# Patient Record
Sex: Female | Born: 1955 | Race: White | Hispanic: No | Marital: Single | Smoking: Former smoker
Health system: Southern US, Community
[De-identification: ages and names within clinical notes are randomized; demographics above are authoritative.]

## PROBLEM LIST (undated history)

## (undated) DIAGNOSIS — L659 Nonscarring hair loss, unspecified: Secondary | ICD-10-CM

## (undated) DIAGNOSIS — M25569 Pain in unspecified knee: Secondary | ICD-10-CM

## (undated) DIAGNOSIS — L853 Xerosis cutis: Secondary | ICD-10-CM

## (undated) DIAGNOSIS — R0683 Snoring: Secondary | ICD-10-CM

## (undated) DIAGNOSIS — R3915 Urgency of urination: Secondary | ICD-10-CM

## (undated) DIAGNOSIS — M2560 Stiffness of unspecified joint, not elsewhere classified: Secondary | ICD-10-CM

## (undated) DIAGNOSIS — L918 Other hypertrophic disorders of the skin: Secondary | ICD-10-CM

## (undated) DIAGNOSIS — R0981 Nasal congestion: Secondary | ICD-10-CM

## (undated) DIAGNOSIS — Z889 Allergy status to unspecified drugs, medicaments and biological substances status: Secondary | ICD-10-CM

## (undated) HISTORY — PX: FOOT SURGERY: SHX648

## (undated) HISTORY — DX: Pain in unspecified knee: M25.569

## (undated) HISTORY — DX: Stiffness of unspecified joint, not elsewhere classified: M25.60

## (undated) HISTORY — PX: TONSILLECTOMY: SUR1361

## (undated) HISTORY — DX: Allergy status to unspecified drugs, medicaments and biological substances: Z88.9

## (undated) HISTORY — DX: Other hypertrophic disorders of the skin: L91.8

## (undated) HISTORY — DX: Nonscarring hair loss, unspecified: L65.9

## (undated) HISTORY — DX: Snoring: R06.83

## (undated) HISTORY — DX: Urgency of urination: R39.15

## (undated) HISTORY — DX: Nasal congestion: R09.81

## (undated) HISTORY — DX: Xerosis cutis: L85.3

---

## 1998-09-12 ENCOUNTER — Other Ambulatory Visit: Admission: RE | Admit: 1998-09-12 | Discharge: 1998-09-12 | Payer: Self-pay | Admitting: *Deleted

## 1999-11-02 ENCOUNTER — Other Ambulatory Visit: Admission: RE | Admit: 1999-11-02 | Discharge: 1999-11-02 | Payer: Self-pay | Admitting: *Deleted

## 2000-09-26 ENCOUNTER — Encounter: Payer: Self-pay | Admitting: *Deleted

## 2000-09-26 ENCOUNTER — Encounter: Admission: RE | Admit: 2000-09-26 | Discharge: 2000-09-26 | Payer: Self-pay | Admitting: *Deleted

## 2002-03-01 ENCOUNTER — Other Ambulatory Visit: Admission: RE | Admit: 2002-03-01 | Discharge: 2002-03-01 | Payer: Self-pay | Admitting: Family Medicine

## 2002-04-02 ENCOUNTER — Encounter: Payer: Self-pay | Admitting: Family Medicine

## 2002-04-02 ENCOUNTER — Encounter: Admission: RE | Admit: 2002-04-02 | Discharge: 2002-04-02 | Payer: Self-pay | Admitting: Family Medicine

## 2003-03-13 ENCOUNTER — Other Ambulatory Visit: Admission: RE | Admit: 2003-03-13 | Discharge: 2003-03-13 | Payer: Self-pay | Admitting: Family Medicine

## 2011-08-20 ENCOUNTER — Other Ambulatory Visit (HOSPITAL_COMMUNITY)
Admission: RE | Admit: 2011-08-20 | Discharge: 2011-08-20 | Disposition: A | Discharge status: Home / Self Care | Payer: 59 | Source: Ambulatory Visit | Attending: Family Medicine | Admitting: Family Medicine

## 2011-08-20 ENCOUNTER — Other Ambulatory Visit: Payer: Self-pay | Admitting: Family Medicine

## 2011-08-20 DIAGNOSIS — Z0000 Encounter for general adult medical examination without abnormal findings: Secondary | ICD-10-CM | POA: Insufficient documentation

## 2015-08-20 DIAGNOSIS — H43812 Vitreous degeneration, left eye: Secondary | ICD-10-CM | POA: Diagnosis not present

## 2016-05-26 DIAGNOSIS — L821 Other seborrheic keratosis: Secondary | ICD-10-CM | POA: Diagnosis not present

## 2016-05-26 DIAGNOSIS — L570 Actinic keratosis: Secondary | ICD-10-CM | POA: Diagnosis not present

## 2016-05-26 DIAGNOSIS — L918 Other hypertrophic disorders of the skin: Secondary | ICD-10-CM | POA: Diagnosis not present

## 2016-05-26 DIAGNOSIS — L919 Hypertrophic disorder of the skin, unspecified: Secondary | ICD-10-CM | POA: Diagnosis not present

## 2016-05-26 DIAGNOSIS — B078 Other viral warts: Secondary | ICD-10-CM | POA: Diagnosis not present

## 2017-02-07 DIAGNOSIS — M1711 Unilateral primary osteoarthritis, right knee: Secondary | ICD-10-CM | POA: Diagnosis not present

## 2017-02-07 DIAGNOSIS — M1712 Unilateral primary osteoarthritis, left knee: Secondary | ICD-10-CM | POA: Diagnosis not present

## 2017-03-03 DIAGNOSIS — M25561 Pain in right knee: Secondary | ICD-10-CM | POA: Diagnosis not present

## 2017-03-03 DIAGNOSIS — M25521 Pain in right elbow: Secondary | ICD-10-CM | POA: Diagnosis not present

## 2017-03-31 DIAGNOSIS — H524 Presbyopia: Secondary | ICD-10-CM | POA: Diagnosis not present

## 2017-03-31 DIAGNOSIS — H5213 Myopia, bilateral: Secondary | ICD-10-CM | POA: Diagnosis not present

## 2017-03-31 DIAGNOSIS — H43812 Vitreous degeneration, left eye: Secondary | ICD-10-CM | POA: Diagnosis not present

## 2017-05-18 ENCOUNTER — Encounter (INDEPENDENT_AMBULATORY_CARE_PROVIDER_SITE_OTHER): Payer: Self-pay

## 2017-05-25 ENCOUNTER — Ambulatory Visit (INDEPENDENT_AMBULATORY_CARE_PROVIDER_SITE_OTHER): Payer: BLUE CROSS/BLUE SHIELD | Admitting: Family Medicine

## 2017-05-25 ENCOUNTER — Encounter (INDEPENDENT_AMBULATORY_CARE_PROVIDER_SITE_OTHER): Payer: Self-pay | Admitting: Family Medicine

## 2017-05-25 VITALS — BP 137/86 | HR 77 | Temp 98.0°F | Ht 66.0 in | Wt 325.0 lb

## 2017-05-25 DIAGNOSIS — Z6841 Body Mass Index (BMI) 40.0 and over, adult: Secondary | ICD-10-CM

## 2017-05-25 DIAGNOSIS — Z0289 Encounter for other administrative examinations: Secondary | ICD-10-CM

## 2017-05-25 DIAGNOSIS — Z9189 Other specified personal risk factors, not elsewhere classified: Secondary | ICD-10-CM

## 2017-05-25 DIAGNOSIS — Z1331 Encounter for screening for depression: Secondary | ICD-10-CM | POA: Diagnosis not present

## 2017-05-25 DIAGNOSIS — E559 Vitamin D deficiency, unspecified: Secondary | ICD-10-CM | POA: Diagnosis not present

## 2017-05-25 DIAGNOSIS — I10 Essential (primary) hypertension: Secondary | ICD-10-CM

## 2017-05-25 DIAGNOSIS — R5383 Other fatigue: Secondary | ICD-10-CM | POA: Diagnosis not present

## 2017-05-25 DIAGNOSIS — R0602 Shortness of breath: Secondary | ICD-10-CM

## 2017-05-25 NOTE — Progress Notes (Signed)
Office: [phone]  /  Fax: [phone]   Dear Dr. Luiz Blare,   Thank you for referring Lindsey [name] to our clinic. The following note includes my evaluation and treatment recommendations.  HPI:   Chief Complaint: OBESITY    ATHANASIA [name] has been referred by Lindsey Geralds, MD for consultation regarding her obesity and obesity related comorbidities.    LACOYA [name] (MR# [id number]) is a 62 y.o. female who presents on 05/25/2017 for obesity evaluation and treatment. Current BMI is Body mass index is 52.46 kg/m.Erskine Squibb has been struggling with her weight for many years and has been unsuccessful in either losing weight, maintaining weight loss, or reaching her healthy weight goal.     [name] must get BMI below 40 to have knee replacement.      Lindsey [name] attended our information session and states she is currently in the action stage of change and ready to dedicate time achieving and maintaining a healthier weight. Lindsey [name] is interested in becoming our patient and working on intensive lifestyle modifications including (but not limited to) diet, exercise and weight loss.    Lindsey [name] states she struggles with family and or coworkers weight loss sabotage her desired weight loss is 125 or 190 lbs she started gaining weight 62 years old she has significant food cravings issues  she snacks frequently in the evenings she wakes up frquently in the middle of the night to eat she skips meals frequently she is frequently drinking liquids with calories she frequently makes poor food choices she has problems with excessive hunger  she frequently eats larger portions than normal  she has binge eating behaviors she struggles with emotional eating    Fatigue [name] feels her energy is lower than it should be. This has worsened with weight gain and has not worsened recently. [name] admits to daytime somnolence and  admits to waking up still tired. Patient is at risk for obstructive sleep apnea. Patent has a history of  symptoms of daytime fatigue. Patient generally gets 8 hours of sleep per night, and states they generally have generally restful sleep. Snoring is present. Apneic episodes are present. Epworth Sleepiness Score is 8.  Dyspnea on exertion Saralynn notes increasing shortness of breath with exercising and seems to be worsening over time with weight gain. She notes getting out of breath sooner with activity than she used to. This has not gotten worse recently. Lindsey [name] denies orthopnea.  Hypertension Lindsey [name] is a 62 y.o. female with hypertension. Lindsey [name] is not on medications, blood pressure in pre-hypertension range. She is not seeing any physicians (including primary care physician) for now and would prefer to diet control. She denies chest pain or headache. She is working weight loss to help control her blood pressure with the goal of decreasing her risk of heart attack and stroke. Lindsey [name]'s blood pressure is not currently controlled.  At risk for cardiovascular disease Lindsey [name] is at a higher than average risk for cardiovascular disease due to obesity and hypertension. She currently denies any chest pain.  Vitamin D Deficiency Lindsey [name] has a diagnosis of vitamin D deficiency. She is not on Vit D, no recent labs. She notes fatigue and denies nausea, vomiting or muscle weakness.  Depression Screen Lindsey [name]'s Food and Mood (modified PHQ-9) score was  Depression screen PHQ 2/9 05/25/2017  Decreased Interest 1  Down, Depressed, Hopeless 2  PHQ - 2 Score 3  Altered sleeping 0  Tired, decreased energy 3  Change in appetite 1  Feeling bad  or failure about yourself  2  Trouble concentrating 2  Moving slowly or fidgety/restless 3  Suicidal thoughts 1  PHQ-9 Score 15  Difficult doing work/chores Somewhat difficult    ALLERGIES: Allergies  Allergen Reactions  . Lactose Intolerance (Gi) Other (See Comments)    Cramping  . Sulfa Antibiotics   . Amoxicillin Rash    MEDICATIONS: No current outpatient medications  on file prior to visit.   No current facility-administered medications on file prior to visit.     PAST MEDICAL HISTORY: Past Medical History:  Diagnosis Date  . Dry skin   . H/O seasonal allergies   . Hair loss   . Knee pain   . Multiple stiff joints   . Nasal congestion   . Skin tag   . Snoring   . Urgency of urination     PAST SURGICAL HISTORY: Past Surgical History:  Procedure Laterality Date  . FOOT SURGERY    . TONSILLECTOMY      SOCIAL HISTORY: Social History   Tobacco Use  . Smoking status: Former Smoker    Packs/day: 1.00    Years: 15.00    Pack years: 15.00    Types: Cigarettes  . Smokeless tobacco: Never Used  . Tobacco comment: quit 10 years ago  Substance Use Topics  . Alcohol use: Yes    Comment: 1-2 drinks a month or less  . Drug use: No    FAMILY HISTORY: Family History  Problem Relation Age of Onset  . High blood pressure Mother   . Depression Mother   . Obesity Mother   . Diabetes Father   . Heart disease Father   . Cancer Father   . Sleep apnea Father   . Obesity Father     ROS: Review of Systems  Constitutional: Positive for malaise/fatigue. Negative for weight loss.  HENT:       + Hoarseness + Nasal stuffiness  Eyes:       + Vision changes + Wear glasses or contacts + Floaters  Respiratory: Positive for shortness of breath (with exertion).   Cardiovascular: Negative for chest pain and orthopnea.       + Difficulty breathing while lying down + Calf/leg pain with walking + Leg cramping  Gastrointestinal: Negative for nausea and vomiting.  Genitourinary: Positive for urgency.  Musculoskeletal:       Negative muscle weakness + Muscle or joint pain + Muscle stiffness  Skin: Positive for itching.       + Dryness  Neurological: Positive for weakness. Negative for headaches.  Psychiatric/Behavioral: Positive for depression. Negative for suicidal ideas.       + Stress    PHYSICAL EXAM: Blood pressure 137/86, pulse 77,  temperature 98 F (36.7 C), temperature source Oral, height 5\' 6"  (1.676 m), weight (!) 325 lb (147.4 kg), SpO2 94 %. Body mass index is 52.46 kg/m. Physical Exam  Constitutional: She is oriented to person, place, and time. She appears well-developed and well-nourished.  HENT:  Head: Normocephalic and atraumatic.  Nose: Nose normal.  Eyes: EOM are normal. No scleral icterus.  Neck: Normal range of motion. Neck supple. No thyromegaly present.  Cardiovascular: Normal rate and regular rhythm.  Pulmonary/Chest: Effort normal. No respiratory distress.  Abdominal: Soft. There is no tenderness.  + Obesity  Musculoskeletal:  Range of Motion normal in all 4 extremities Trace edema noted in bilateral lower extremities  Neurological: She is alert and oriented to person, place, and time. Coordination normal.  Skin: Skin  is warm and dry.  Psychiatric: She has a normal mood and affect. Her behavior is normal.  Vitals reviewed.   RECENT LABS AND TESTS: BMET No results found for: NA, K, CL, CO2, GLUCOSE, BUN, CREATININE, CALCIUM, GFRNONAA, GFRAA No results found for: HGBA1C No results found for: INSULIN CBC No results found for: WBC, RBC, HGB, HCT, PLT, MCV, MCH, MCHC, RDW, LYMPHSABS, MONOABS, EOSABS, BASOSABS Iron/TIBC/Ferritin/ %Sat No results found for: IRON, TIBC, FERRITIN, IRONPCTSAT Lipid Panel  No results found for: CHOL, TRIG, HDL, CHOLHDL, VLDL, LDLCALC, LDLDIRECT Hepatic Function Panel  No results found for: PROT, ALBUMIN, AST, ALT, ALKPHOS, BILITOT, BILIDIR, IBILI No results found for: TSH  ECG  shows NSR with a rate of 80 BPM INDIRECT CALORIMETER done today shows a VO2 of 439 and a REE of 3053.  Her calculated basal metabolic rate is [id number] thus her basal metabolic rate is better than expected.    ASSESSMENT AND PLAN: Other fatigue - Plan: EKG 12-Lead, Vitamin B12, CBC With Differential, Comprehensive metabolic panel, Folate, Hemoglobin A1c, Insulin, random, Lipid Panel With  LDL/HDL Ratio, T3, T4, free, TSH  Shortness of breath on exertion - Plan: CBC With Differential  Essential hypertension - Plan: Comprehensive metabolic panel  Vitamin D deficiency - Plan: VITAMIN D 25 Hydroxy (Vit-D Deficiency, Fractures)  Depression screening  At risk for heart disease  Class 3 severe obesity with serious comorbidity and body mass index (BMI) of 50.0 to 59.9 in adult, unspecified obesity type (HCC)  PLAN:  Fatigue Erskine SquibbJane was informed that her fatigue may be related to obesity, depression or many other causes. Labs will be ordered, and in the meanwhile Erskine SquibbJane has agreed to work on diet, exercise and weight loss to help with fatigue. Proper sleep hygiene was discussed including the need for 7-8 hours of quality sleep each night. A sleep study was not ordered based on symptoms and Epworth score.  Dyspnea on exertion Alexxus's shortness of breath appears to be obesity related and exercise induced. She has agreed to work on weight loss and gradually increase exercise to treat her exercise induced shortness of breath. If Erskine SquibbJane follows our instructions and loses weight without improvement of her shortness of breath, we will plan to refer to pulmonology. We will monitor this condition regularly. Erskine SquibbJane agrees to this plan.  Hypertension We discussed sodium restriction, working on healthy weight loss, and a regular exercise program as the means to achieve improved blood pressure control. Erskine SquibbJane agreed with this plan and agreed to follow up as directed. We will continue to monitor her blood pressure as well as her progress with the above lifestyle modifications. She will continue diet and exercise and will watch for signs of hypotension as she continues her lifestyle modifications. We will check labs and Erskine SquibbJane agrees to follow up with our clinic in 2 weeks and we will recheck blood pressure at that time.  Cardiovascular risk counselling Erskine SquibbJane was given extended (15 minutes) coronary artery  disease prevention counseling today. She is 62 y.o. female and has risk factors for heart disease including obesity and hypertension. We discussed intensive lifestyle modifications today with an emphasis on specific weight loss instructions and strategies. Pt was also informed of the importance of increasing exercise and decreasing saturated fats to help prevent heart disease.  Vitamin D Deficiency Erskine SquibbJane was informed that low vitamin D levels contributes to fatigue and are associated with obesity, breast, and colon cancer. She will follow up for routine testing of vitamin D, at least 2-3 times  per year. She was informed of the risk of over-replacement of vitamin D and agrees to not increase her dose unless she discusses this with [place] first. We will check labs and Tija agrees to follow up with our clinic in 2 weeks.  Depression Screen Chyanna had a strongly positive depression screening. Depression is commonly associated with obesity and often results in emotional eating behaviors. We will monitor this closely and work on CBT to help improve the non-hunger eating patterns. Referral to Psychology may be required if no improvement is seen as she continues in our clinic.  Obesity Morrisa is currently in the action stage of change and her goal is to continue with weight loss efforts. I recommend Inanna begin the structured treatment plan as follows:  She has agreed to follow the Category 3 plan + 300 calories Lakecia has been instructed to eventually work up to a goal of 150 minutes of combined cardio and strengthening exercise per week for weight loss and overall health benefits. We discussed the following Behavioral Modification Strategies today: increasing lean protein intake, decreasing simple carbohydrates  and decrease eating out   She was informed of the importance of frequent follow up visits to maximize her success with intensive lifestyle modifications for her multiple health conditions. She was informed we  would discuss her lab results at her next visit unless there is a critical issue that needs to be addressed sooner. Tabria agreed to keep her next visit at the agreed upon time to discuss these results.    OBESITY BEHAVIORAL INTERVENTION VISIT  Today's visit was # 1 out of 22.  Starting weight: 325 lbs Starting date: 05/25/17 Today's weight : 325 lbs  Today's date: 05/25/2017 Total lbs lost to date: 0 (Patients must lose 7 lbs in the first 6 months to continue with counseling)   ASK: We discussed the diagnosis of obesity with [name] today and Jaidee agreed to give [place] permission to discuss obesity behavioral modification therapy today.  ASSESS: Quenesha has the diagnosis of obesity and her BMI today is 52.48 Tanazia is in the action stage of change   ADVISE: Jody was educated on the multiple health risks of obesity as well as the benefit of weight loss to improve her health. She was advised of the need for long term treatment and the importance of lifestyle modifications.  AGREE: Multiple dietary modification options and treatment options were discussed and  Lekia agreed to the above obesity treatment plan.   I, Burt Knack, am acting as transcriptionist for Quillian Quince, MD  I have reviewed the above documentation for accuracy and completeness, and I agree with the above. -Quillian Quince, MD

## 2017-05-26 LAB — CBC WITH DIFFERENTIAL
Basophils Absolute: 0 10*3/uL (ref 0.0–0.2)
Basos: 1 %
EOS (ABSOLUTE): 0.2 10*3/uL (ref 0.0–0.4)
Eos: 4 %
Hematocrit: 40.7 % (ref 34.0–46.6)
Hemoglobin: 13.6 g/dL (ref 11.1–15.9)
Immature Grans (Abs): 0 10*3/uL (ref 0.0–0.1)
Immature Granulocytes: 0 %
Lymphocytes Absolute: 1.8 10*3/uL (ref 0.7–3.1)
Lymphs: 30 %
MCH: 30.5 pg (ref 26.6–33.0)
MCHC: 33.4 g/dL (ref 31.5–35.7)
MCV: 91 fL (ref 79–97)
Monocytes Absolute: 0.6 10*3/uL (ref 0.1–0.9)
Monocytes: 10 %
Neutrophils Absolute: 3.3 10*3/uL (ref 1.4–7.0)
Neutrophils: 55 %
RBC: 4.46 x10E6/uL (ref 3.77–5.28)
RDW: 13.9 % (ref 12.3–15.4)
WBC: 6 10*3/uL (ref 3.4–10.8)

## 2017-05-26 LAB — COMPREHENSIVE METABOLIC PANEL
ALT: 60 IU/L — ABNORMAL HIGH (ref 0–32)
AST: 31 IU/L (ref 0–40)
Albumin/Globulin Ratio: 1.5 (ref 1.2–2.2)
Albumin: 4.1 g/dL (ref 3.6–4.8)
Alkaline Phosphatase: 81 IU/L (ref 39–117)
BUN/Creatinine Ratio: 13 (ref 12–28)
BUN: 10 mg/dL (ref 8–27)
Bilirubin Total: 0.8 mg/dL (ref 0.0–1.2)
CO2: 25 mmol/L (ref 20–29)
Calcium: 9.5 mg/dL (ref 8.7–10.3)
Chloride: 104 mmol/L (ref 96–106)
Creatinine, Ser: 0.75 mg/dL (ref 0.57–1.00)
GFR calc Af Amer: 99 mL/min/{1.73_m2} (ref >59)
GFR calc non Af Amer: 86 mL/min/{1.73_m2} (ref >59)
Globulin, Total: 2.7 g/dL (ref 1.5–4.5)
Glucose: 110 mg/dL — ABNORMAL HIGH (ref 65–99)
Potassium: 4.8 mmol/L (ref 3.5–5.2)
Sodium: 144 mmol/L (ref 134–144)
Total Protein: 6.8 g/dL (ref 6.0–8.5)

## 2017-05-26 LAB — LIPID PANEL WITH LDL/HDL RATIO
Cholesterol, Total: 164 mg/dL (ref 100–199)
HDL: 49 mg/dL (ref >39)
LDL Calculated: 100 mg/dL — ABNORMAL HIGH (ref 0–99)
LDL/HDL RATIO: 2 ratio (ref 0.0–3.2)
Triglycerides: 75 mg/dL (ref 0–149)
VLDL Cholesterol Cal: 15 mg/dL (ref 5–40)

## 2017-05-26 LAB — HEMOGLOBIN A1C
Est. average glucose Bld gHb Est-mCnc: 117 mg/dL
Hgb A1c MFr Bld: 5.7 % — ABNORMAL HIGH (ref 4.8–5.6)

## 2017-05-26 LAB — VITAMIN D 25 HYDROXY (VIT D DEFICIENCY, FRACTURES): Vit D, 25-Hydroxy: 24.1 ng/mL — ABNORMAL LOW (ref 30.0–100.0)

## 2017-05-26 LAB — T3: T3 TOTAL: 144 ng/dL (ref 71–180)

## 2017-05-26 LAB — FOLATE: Folate: >20 ng/mL (ref >3.0)

## 2017-05-26 LAB — T4, FREE: Free T4: 1.12 ng/dL (ref 0.82–1.77)

## 2017-05-26 LAB — INSULIN, RANDOM: INSULIN: 44.2 u[IU]/mL — ABNORMAL HIGH (ref 2.6–24.9)

## 2017-05-26 LAB — VITAMIN B12: Vitamin B-12: 341 pg/mL (ref 232–1245)

## 2017-05-26 LAB — TSH: TSH: 2.07 u[IU]/mL (ref 0.450–4.500)

## 2017-05-29 ENCOUNTER — Encounter (INDEPENDENT_AMBULATORY_CARE_PROVIDER_SITE_OTHER): Payer: Self-pay | Admitting: Family Medicine

## 2017-06-01 ENCOUNTER — Encounter (INDEPENDENT_AMBULATORY_CARE_PROVIDER_SITE_OTHER): Payer: Self-pay | Admitting: Family Medicine

## 2017-06-08 ENCOUNTER — Ambulatory Visit (INDEPENDENT_AMBULATORY_CARE_PROVIDER_SITE_OTHER): Payer: BLUE CROSS/BLUE SHIELD | Admitting: Family Medicine

## 2017-06-08 ENCOUNTER — Encounter (INDEPENDENT_AMBULATORY_CARE_PROVIDER_SITE_OTHER): Payer: Self-pay | Admitting: Family Medicine

## 2017-06-08 VITALS — BP 176/97 | HR 77 | Temp 98.1°F | Ht 66.0 in | Wt 312.0 lb

## 2017-06-08 DIAGNOSIS — E559 Vitamin D deficiency, unspecified: Secondary | ICD-10-CM | POA: Diagnosis not present

## 2017-06-08 DIAGNOSIS — R7303 Prediabetes: Secondary | ICD-10-CM | POA: Diagnosis not present

## 2017-06-08 DIAGNOSIS — Z9189 Other specified personal risk factors, not elsewhere classified: Secondary | ICD-10-CM

## 2017-06-08 DIAGNOSIS — Z6841 Body Mass Index (BMI) 40.0 and over, adult: Secondary | ICD-10-CM | POA: Diagnosis not present

## 2017-06-08 MED ORDER — VITAMIN D (ERGOCALCIFEROL) 1.25 MG (50000 UNIT) PO CAPS: 50000.0000 [IU] | ORAL_CAPSULE | ORAL | 0 refills | Status: DC

## 2017-06-08 NOTE — Progress Notes (Signed)
Office: [phone]  /  Fax: [phone]   HPI:   Chief Complaint: OBESITY Lindsey [name] is here to discuss her progress with her obesity treatment plan. She is on the Category 3 plan +300 calories and is following her eating plan approximately 90 % of the time. She states she is exercising 0 minutes 0 times per week. Chauntae has done well with weight loss, but she still ate out 2 to 3 times per week, but she made better choices. She really misses simple carbohydrates, especially in the evening. She has anxiety about her weight and has googled many of her lab results. Her weight is (!) 312 lb (141.5 kg) today and has had a weight loss of 13 pounds over a period of 2 weeks since her last visit. She has lost 13 lbs since starting treatment with [place].  Vitamin D deficiency Lindsey [name] has a diagnosis of vitamin D deficiency. She is currently taking multi vitamin and denies nausea, vomiting or muscle weakness.  Pre-Diabetes Lindsey [name] has a new diagnosis of pre-diabetes based on her elevated Hgb A1c, fasting glucose and insulin. Lindsey [name] this puts her at greater risk of developing diabetes. Lindsey [name] is not taking metformin currently and she continues to work on diet and exercise to decrease risk of diabetes. She notes polyphagia and feels deprived with lower simple carbohydrates, but she has done well with weight loss. Lindsey [name] denies nausea or hypoglycemia.  At risk for diabetes Lindsey [name] is at higher than average risk for developing diabetes due to her obesity. She currently denies polyuria or polydipsia.  ALLERGIES: Allergies  Allergen Reactions  . Lactose Intolerance (Gi) Other (See Comments)    Cramping  . Sulfa Antibiotics   . Amoxicillin Rash    MEDICATIONS: No current outpatient medications on file prior to visit.   No current facility-administered medications on file prior to visit.     PAST MEDICAL HISTORY: Past Medical History:  Diagnosis Date  . Dry skin   . H/O seasonal allergies   . Hair loss    . Knee pain   . Multiple stiff joints   . Nasal congestion   . Skin tag   . Snoring   . Urgency of urination     PAST SURGICAL HISTORY: Past Surgical History:  Procedure Laterality Date  . FOOT SURGERY    . TONSILLECTOMY      SOCIAL HISTORY: Social History   Tobacco Use  . Smoking status: Former Smoker    Packs/day: 1.00    Years: 15.00    Pack years: 15.00    Types: Cigarettes  . Smokeless tobacco: Never Used  . Tobacco comment: quit 10 years ago  Substance Use Topics  . Alcohol use: Yes    Comment: 1-2 drinks a month or less  . Drug use: No    FAMILY HISTORY: Family History  Problem Relation Age of Onset  . High blood pressure Mother   . Depression Mother   . Obesity Mother   . Diabetes Father   . Heart disease Father   . Cancer Father   . Sleep apnea Father   . Obesity Father     ROS: Review of Systems  Constitutional: Positive for weight loss.  Gastrointestinal: Negative for nausea and vomiting.  Musculoskeletal:       Negative for muscle weakness  Endo/Heme/Allergies:       Positive for polyphagia Negative for hypoglycemia    PHYSICAL EXAM: Blood pressure (!) 176/97, pulse 77, temperature 98.1 F (36.7 C), temperature  source Oral, height 5\' 6"  (1.676 m), weight (!) 312 lb (141.5 kg), SpO2 97 %. Body mass index is 50.36 kg/m. Physical Exam  Constitutional: She is oriented to person, place, and time. She appears well-developed and well-nourished.  Cardiovascular: Normal rate.  Pulmonary/Chest: Effort normal.  Musculoskeletal: Normal range of motion.  Neurological: She is oriented to person, place, and time.  Skin: Skin is warm and dry.  Psychiatric: She has a normal mood and affect. Her behavior is normal.  Vitals reviewed.   RECENT LABS AND TESTS: BMET    Component Value Date/Time   NA 144 05/25/2017 1147   K 4.8 05/25/2017 1147   CL 104 05/25/2017 1147   CO2 25 05/25/2017 1147   GLUCOSE 110 (H) 05/25/2017 1147   BUN 10  05/25/2017 1147   CREATININE 0.75 05/25/2017 1147   CALCIUM 9.5 05/25/2017 1147   GFRNONAA 86 05/25/2017 1147   GFRAA 99 05/25/2017 1147   Lab Results  Component Value Date   HGBA1C 5.7 (H) 05/25/2017   Lab Results  Component Value Date   INSULIN 44.2 (H) 05/25/2017   CBC    Component Value Date/Time   WBC 6.0 05/25/2017 1147   RBC 4.46 05/25/2017 1147   HGB 13.6 05/25/2017 1147   HCT 40.7 05/25/2017 1147   MCV 91 05/25/2017 1147   MCH 30.5 05/25/2017 1147   MCHC 33.4 05/25/2017 1147   RDW 13.9 05/25/2017 1147   LYMPHSABS 1.8 05/25/2017 1147   EOSABS 0.2 05/25/2017 1147   BASOSABS 0.0 05/25/2017 1147   Iron/TIBC/Ferritin/ %Sat No results found for: IRON, TIBC, FERRITIN, IRONPCTSAT Lipid Panel     Component Value Date/Time   CHOL 164 05/25/2017 1147   TRIG 75 05/25/2017 1147   HDL 49 05/25/2017 1147   LDLCALC 100 (H) 05/25/2017 1147   Hepatic Function Panel     Component Value Date/Time   PROT 6.8 05/25/2017 1147   ALBUMIN 4.1 05/25/2017 1147   AST 31 05/25/2017 1147   ALT 60 (H) 05/25/2017 1147   ALKPHOS 81 05/25/2017 1147   BILITOT 0.8 05/25/2017 1147      Component Value Date/Time   TSH 2.070 05/25/2017 1147   Results for ADAIAH, JASKOT (MRN [id number]) as of 06/08/2017 17:26  Ref. Range 05/25/2017 11:47  Vitamin D, 25-Hydroxy Latest Ref Range: 30.0 - 100.0 ng/mL 24.1 (L)    ASSESSMENT AND PLAN: Vitamin D deficiency - Plan: Vitamin D, Ergocalciferol, (DRISDOL) 50000 units CAPS capsule  Prediabetes  At risk for diabetes mellitus  Class 3 severe obesity with serious comorbidity and body mass index (BMI) of 50.0 to 59.9 in adult, unspecified obesity type (HCC)  PLAN:  Vitamin D Deficiency Lindsey [name] that low vitamin D levels contributes to fatigue and are associated with obesity, breast, and colon cancer. She agrees to continue multi vitamin and start to take prescription Vit D @50 ,000 IU every week #4 with no refills and will follow up for  routine testing of vitamin D, at least 2-3 times per year. She was informed of the risk of over-replacement of vitamin D and agrees to not increase her dose unless she discusses this with [place] first. Mckynlie agrees to follow up with our clinic in 2 weeks.  Pre-Diabetes Lindsey [name] will continue to work on weight loss, exercise, and decreasing simple carbohydrates in her diet to help decrease the risk of diabetes. We dicussed metformin including benefits and risks. She was informed that eating too many simple carbohydrates or too many calories at  one sitting increases the likelihood of GI side effects. Lindsey [name] defers metformin for now and a prescription was not written today. We will recheck labs in 3 months and Lindsey [name] agreed to follow up with us as directed to monitor her progress.  Diabetes risk counseling Lindsey [name] was given extended (30 minutes) diabetes prevention counseling today. She is 62 y.o. female and has risk factors for diabetes including obesity and pre-diabetes. We discussed intensive lifestyle modifications today with an emphasis on weight loss as well as increasing exercise and decreasing simple carbohydrates in her diet.  Obesity Lindsey [name] is currently in the action stage of change. As such, her goal is to continue with weight loss efforts She has agreed to follow the Category 3 plan +300 calories with some modifications Lindsey [name] has been instructed to work up to a goal of 150 minutes of combined cardio and strengthening exercise per week for weight loss and overall health benefits. We discussed the following Behavioral Modification Strategies today: increasing lean protein intake and decreasing simple carbohydrates   Lindsey [name] has agreed to follow up with our clinic in 2 weeks. She was informed of the importance of frequent follow up visits to maximize her success with intensive lifestyle modifications for her multiple health conditions.   OBESITY BEHAVIORAL INTERVENTION VISIT  Today's visit was # 2 out of  22.  Starting weight: 325 lbs Starting date: 05/25/17 Today's weight : 312 lbs Today's date: 06/08/2017 Total lbs lost to date: 13 (Patients must lose 7 lbs in the first 6 months to continue with counseling)   ASK: We discussed the diagnosis of obesity with Georg RuddleJane E Ciresi today and Lindsey [name] agreed to give us permission to discuss obesity behavioral modification therapy today.  ASSESS: Lindsey [name] has the diagnosis of obesity and her BMI today is 50.38 Lindsey [name] is in the action stage of change   ADVISE: Lindsey [name] was educated on the multiple health risks of obesity as well as the benefit of weight loss to improve her health. She was advised of the need for long term treatment and the importance of lifestyle modifications.  AGREE: Multiple dietary modification options and treatment options were discussed and  Lindsey [name] agreed to the above obesity treatment plan.  I, [place] CraneJoanne Murray, am acting as transcriptionist for Quillian Quincearen Beasley, MD  I have reviewed the above documentation for accuracy and completeness, and I agree with the above. -Quillian Quincearen Beasley, MD

## 2017-06-15 ENCOUNTER — Encounter (INDEPENDENT_AMBULATORY_CARE_PROVIDER_SITE_OTHER): Payer: Self-pay | Admitting: Family Medicine

## 2017-06-28 DIAGNOSIS — H1031 Unspecified acute conjunctivitis, right eye: Secondary | ICD-10-CM | POA: Diagnosis not present

## 2017-06-29 ENCOUNTER — Ambulatory Visit (INDEPENDENT_AMBULATORY_CARE_PROVIDER_SITE_OTHER): Payer: BLUE CROSS/BLUE SHIELD | Admitting: Family Medicine

## 2017-06-29 VITALS — BP 148/80 | HR 78 | Temp 97.9°F | Ht 66.0 in | Wt 304.0 lb

## 2017-06-29 DIAGNOSIS — Z9189 Other specified personal risk factors, not elsewhere classified: Secondary | ICD-10-CM

## 2017-06-29 DIAGNOSIS — I10 Essential (primary) hypertension: Secondary | ICD-10-CM

## 2017-06-29 DIAGNOSIS — E559 Vitamin D deficiency, unspecified: Secondary | ICD-10-CM | POA: Diagnosis not present

## 2017-06-29 DIAGNOSIS — Z6841 Body Mass Index (BMI) 40.0 and over, adult: Secondary | ICD-10-CM

## 2017-06-29 MED ORDER — VITAMIN D (ERGOCALCIFEROL) 1.25 MG (50000 UNIT) PO CAPS: 50000.0000 [IU] | ORAL_CAPSULE | ORAL | 0 refills | Status: DC

## 2017-06-29 NOTE — Progress Notes (Signed)
Office: [phone]  /  Fax: [phone]   HPI:   Chief Complaint: OBESITY Lindsey [name] is here to discuss her progress with her obesity treatment plan. She is on the Category 3 plan + 300 calories with some modifications and is following her eating plan approximately 75 % of the time. She states she is exercising 0 minutes 0 times per week. Lindsey [name] continues to do well with weight loss but is feeling deprived especially for dinner. She is struggling to eat all of her protein.  Her weight is (!) 304 lb (137.9 kg) today and has had a weight loss of 8 pounds over a period of 3 weeks since her last visit. She has lost 21 lbs since starting treatment with Lindsey [name].  Vitamin D Deficiency Lindsey [name] has a diagnosis of vitamin D deficiency. She is on prescription Vit D, not yet at goal. She denies nausea, vomiting or muscle weakness.  Hypertension Lindsey RuddleJane E [name] is a 62 y.o. female with hypertension. Lindsey [name]'s blood pressure is not yet at goal, she wants to attempt to improve with diet and exercise. She denies chest pain or headache. She is working weight loss to help control her blood pressure with the goal of decreasing her risk of heart attack and stroke. Hebe's blood pressure is not currently controlled.  At risk for cardiovascular disease Lindsey [name] is at a higher than average risk for cardiovascular disease due to obesity and hypertension. She currently denies any chest pain.  ALLERGIES: Allergies  Allergen Reactions  . Lactose Intolerance (Gi) Other (See Comments)    Cramping  . Sulfa Antibiotics   . Amoxicillin Rash    MEDICATIONS: Current Outpatient Medications on File Prior to Visit  Medication Sig Dispense Refill  . acyclovir (ZOVIRAX) 400 MG tablet Take 400 mg by mouth 5 (five) times daily.     No current facility-administered medications on file prior to visit.     PAST MEDICAL HISTORY: Past Medical History:  Diagnosis Date  . Dry skin   . H/O seasonal allergies   . Hair loss   . Knee pain   .  Multiple stiff joints   . Nasal congestion   . Skin tag   . Snoring   . Urgency of urination     PAST SURGICAL HISTORY: Past Surgical History:  Procedure Laterality Date  . FOOT SURGERY    . TONSILLECTOMY      SOCIAL HISTORY: Social History   Tobacco Use  . Smoking status: Former Smoker    Packs/day: 1.00    Years: 15.00    Pack years: 15.00    Types: Cigarettes  . Smokeless tobacco: Never Used  . Tobacco comment: quit 10 years ago  Substance Use Topics  . Alcohol use: Yes    Comment: 1-2 drinks a month or less  . Drug use: No    FAMILY HISTORY: Family History  Problem Relation Age of Onset  . High blood pressure Mother   . Depression Mother   . Obesity Mother   . Diabetes Father   . Heart disease Father   . Cancer Father   . Sleep apnea Father   . Obesity Father     ROS: Review of Systems  Constitutional: Positive for weight loss.  Cardiovascular: Negative for chest pain.  Gastrointestinal: Negative for nausea and vomiting.  Musculoskeletal:       Negative muscle weakness  Neurological: Negative for headaches.    PHYSICAL EXAM: Blood pressure (!) 148/80, pulse 78, temperature 97.9 F (36.6 C), temperature  source Oral, height 5\' 6"  (1.676 m), weight (!) 304 lb (137.9 kg), SpO2 97 %. Body mass index is 49.07 kg/m. Physical Exam  Constitutional: She is oriented to person, place, and time. She appears well-developed and well-nourished.  Cardiovascular: Normal rate.  Pulmonary/Chest: Effort normal.  Musculoskeletal: Normal range of motion.  Neurological: She is oriented to person, place, and time.  Skin: Skin is warm and dry.  Psychiatric: She has a normal mood and affect. Her behavior is normal.  Vitals reviewed.   RECENT LABS AND TESTS: BMET    Component Value Date/Time   NA 144 05/25/2017 1147   K 4.8 05/25/2017 1147   CL 104 05/25/2017 1147   CO2 25 05/25/2017 1147   GLUCOSE 110 (H) 05/25/2017 1147   BUN 10 05/25/2017 1147   CREATININE  0.75 05/25/2017 1147   CALCIUM 9.5 05/25/2017 1147   GFRNONAA 86 05/25/2017 1147   GFRAA 99 05/25/2017 1147   Lab Results  Component Value Date   HGBA1C 5.7 (H) 05/25/2017   Lab Results  Component Value Date   INSULIN 44.2 (H) 05/25/2017   CBC    Component Value Date/Time   WBC 6.0 05/25/2017 1147   RBC 4.46 05/25/2017 1147   HGB 13.6 05/25/2017 1147   HCT 40.7 05/25/2017 1147   MCV 91 05/25/2017 1147   MCH 30.5 05/25/2017 1147   MCHC 33.4 05/25/2017 1147   RDW 13.9 05/25/2017 1147   LYMPHSABS 1.8 05/25/2017 1147   EOSABS 0.2 05/25/2017 1147   BASOSABS 0.0 05/25/2017 1147   Iron/TIBC/Ferritin/ %Sat No results found for: IRON, TIBC, FERRITIN, IRONPCTSAT Lipid Panel     Component Value Date/Time   CHOL 164 05/25/2017 1147   TRIG 75 05/25/2017 1147   HDL 49 05/25/2017 1147   LDLCALC 100 (H) 05/25/2017 1147   Hepatic Function Panel     Component Value Date/Time   PROT 6.8 05/25/2017 1147   ALBUMIN 4.1 05/25/2017 1147   AST 31 05/25/2017 1147   ALT 60 (H) 05/25/2017 1147   ALKPHOS 81 05/25/2017 1147   BILITOT 0.8 05/25/2017 1147      Component Value Date/Time   TSH 2.070 05/25/2017 1147  Results for NYARA, CAPELL (MRN [id number]) as of 06/29/2017 16:26  Ref. Range 05/25/2017 11:47  Vitamin D, 25-Hydroxy Latest Ref Range: 30.0 - 100.0 ng/mL 24.1 (L)    ASSESSMENT AND PLAN: Vitamin D deficiency - Plan: Vitamin D, Ergocalciferol, (DRISDOL) 50000 units CAPS capsule  Essential hypertension  At risk for heart disease  Class 3 severe obesity with serious comorbidity and body mass index (BMI) of 45.0 to 49.9 in adult, unspecified obesity type (HCC)  PLAN:  Vitamin D Deficiency Lindsey [name] that low vitamin D levels contributes to fatigue and are associated with obesity, breast, and colon cancer. Lindsey [name] agrees to continue taking prescription Vit D @50 ,000 IU every week #4 and we will refill for 1 month. She will follow up for routine testing of vitamin D, at  least 2-3 times per year. She was informed of the risk of over-replacement of vitamin D and agrees to not increase her dose unless she discusses this with [place] first. Lindsey [name] agrees to follow up with our clinic in 2 to 3 weeks.  Hypertension We discussed sodium restriction, working on healthy weight loss, and a regular exercise program as the means to achieve improved blood pressure control. Lindsey [name] agreed with this plan and agreed to follow up as directed. We will continue to monitor her blood pressure  as well as her progress with the above lifestyle modifications. She will continue diet and exercise, and continue her medications as prescribed and will watch for signs of hypotension as she continues her lifestyle modifications. Lindsey [name] agrees to follow up with our clinic in 2 to 3 weeks and we will recheck blood pressure at that time.  Cardiovascular risk counselling Lindsey [name] was given extended (15 minutes) coronary artery disease prevention counseling today. She is 62 y.o. female and has risk factors for heart disease including obesity and hypertension. We discussed intensive lifestyle modifications today with an emphasis on specific weight loss instructions and strategies. Pt was also informed of the importance of increasing exercise and decreasing saturated fats to help prevent heart disease.  Obesity Lindsey [name] is currently in the action stage of change. As such, her goal is to continue with weight loss efforts She has agreed to keep a food journal with 400-650 calories and 40 grams of protein at supper daily and follow the Category 3 plan + 300 calories Lindsey [name] has been instructed to work up to a goal of 150 minutes of combined cardio and strengthening exercise per week for weight loss and overall health benefits. We discussed the following Behavioral Modification Strategies today: increasing lean protein intake, decreasing simple carbohydrates, and keep a strict food journal     Lindsey [name] has agreed to follow up with our  clinic in 2 to 3 weeks. She was informed of the importance of frequent follow up visits to maximize her success with intensive lifestyle modifications for her multiple health conditions.   OBESITY BEHAVIORAL INTERVENTION VISIT  Today's visit was # 3 out of 22.  Starting weight: 325 lbs Starting date: 05/25/17 Today's weight : 304 lbs Today's date: 06/29/2017 Total lbs lost to date: 21 (Patients must lose 7 lbs in the first 6 months to continue with counseling)   ASK: We discussed the diagnosis of obesity with Lindsey [name] today and Lindsey [name] agreed to give [place] permission to discuss obesity behavioral modification therapy today.  ASSESS: Lindsey [name] has the diagnosis of obesity and her BMI today is 49.09 Lindsey [name] is in the action stage of change   ADVISE: Lindsey [name] was educated on the multiple health risks of obesity as well as the benefit of weight loss to improve her health. She was advised of the need for long term treatment and the importance of lifestyle modifications.  AGREE: Multiple dietary modification options and treatment options were discussed and  Lindsey [name] agreed to the above obesity treatment plan.  I, Burt Knack, am acting as transcriptionist for Quillian Quince, MD  I have reviewed the above documentation for accuracy and completeness, and I agree with the above. -Quillian Quince, MD

## 2017-07-01 DIAGNOSIS — H1031 Unspecified acute conjunctivitis, right eye: Secondary | ICD-10-CM | POA: Diagnosis not present

## 2017-07-06 DIAGNOSIS — H1031 Unspecified acute conjunctivitis, right eye: Secondary | ICD-10-CM | POA: Diagnosis not present

## 2017-07-08 ENCOUNTER — Encounter (INDEPENDENT_AMBULATORY_CARE_PROVIDER_SITE_OTHER): Payer: Self-pay | Admitting: Family Medicine

## 2017-07-20 ENCOUNTER — Ambulatory Visit (INDEPENDENT_AMBULATORY_CARE_PROVIDER_SITE_OTHER): Payer: BLUE CROSS/BLUE SHIELD | Admitting: Family Medicine

## 2017-07-20 VITALS — BP 138/86 | HR 79 | Temp 98.6°F | Ht 66.0 in | Wt 296.0 lb

## 2017-07-20 DIAGNOSIS — Z6841 Body Mass Index (BMI) 40.0 and over, adult: Secondary | ICD-10-CM

## 2017-07-20 DIAGNOSIS — E559 Vitamin D deficiency, unspecified: Secondary | ICD-10-CM | POA: Diagnosis not present

## 2017-07-20 MED ORDER — VITAMIN D (ERGOCALCIFEROL) 1.25 MG (50000 UNIT) PO CAPS: 50000.0000 [IU] | ORAL_CAPSULE | ORAL | 0 refills | Status: DC

## 2017-07-21 NOTE — Progress Notes (Signed)
Office: [phone]  /  Fax: [phone]   HPI:   Chief Complaint: OBESITY Lindsey [name] is here to discuss her progress with her obesity treatment plan. She is on the keep a food journal with 400-650 calories and 40 grams of protein at supper daily and follow the Category 3 plan + 300 calories and is following her eating plan approximately 80 % of the time. She states she is exercising 0 minutes 0 times per week. Lindsey [name] continues to do well with weight loss, is journaling well for dinner and working on increasing vegetables and meeting her protein goals.  Her weight is 296 lb (134.3 kg) today and has had a weight loss of 8 pounds over a period of 3 weeks since her last visit. She has lost 29 lbs since starting treatment with [place].  Vitamin D Deficiency Lindsey [name] has a diagnosis of vitamin D deficiency. She is stable on prescription Vit D, not yet at goal. She denies nausea, vomiting or muscle weakness.  ALLERGIES: Allergies  Allergen Reactions  . Lactose Intolerance (Gi) Other (See Comments)    Cramping  . Sulfa Antibiotics   . Amoxicillin Rash    MEDICATIONS: Current Outpatient Medications on File Prior to Visit  Medication Sig Dispense Refill  . acyclovir (ZOVIRAX) 400 MG tablet Take 400 mg by mouth 5 (five) times daily.     No current facility-administered medications on file prior to visit.     PAST MEDICAL HISTORY: Past Medical History:  Diagnosis Date  . Dry skin   . H/O seasonal allergies   . Hair loss   . Knee pain   . Multiple stiff joints   . Nasal congestion   . Skin tag   . Snoring   . Urgency of urination     PAST SURGICAL HISTORY: Past Surgical History:  Procedure Laterality Date  . FOOT SURGERY    . TONSILLECTOMY      SOCIAL HISTORY: Social History   Tobacco Use  . Smoking status: Former Smoker    Packs/day: 1.00    Years: 15.00    Pack years: 15.00    Types: Cigarettes  . Smokeless tobacco: Never Used  . Tobacco comment: quit 10 years ago  Substance  Use Topics  . Alcohol use: Yes    Comment: 1-2 drinks a month or less  . Drug use: No    FAMILY HISTORY: Family History  Problem Relation Age of Onset  . High blood pressure Mother   . Depression Mother   . Obesity Mother   . Diabetes Father   . Heart disease Father   . Cancer Father   . Sleep apnea Father   . Obesity Father     ROS: Review of Systems  Constitutional: Positive for weight loss.  Gastrointestinal: Negative for nausea and vomiting.  Musculoskeletal:       Negative muscle weakness    PHYSICAL EXAM: Blood pressure 138/86, pulse 79, temperature 98.6 F (37 C), temperature source Oral, height  (1.676 m), weight 296 lb (134.3 kg), SpO2 95 %. Body mass index is 47.78 kg/m. Physical Exam  Constitutional: She is oriented to person, place, and time. She appears well-developed and well-nourished.  Cardiovascular: Normal rate.  Pulmonary/Chest: Effort normal.  Musculoskeletal: Normal range of motion.  Neurological: She is oriented to person, place, and time.  Skin: Skin is warm and dry.  Psychiatric: She has a normal mood and affect. Her behavior is normal.  Vitals reviewed.   RECENT LABS AND TESTS: BMET  Component Value Date/Time   NA 144 05/25/2017 1147   K 4.8 05/25/2017 1147   CL 104 05/25/2017 1147   CO2 25 05/25/2017 1147   GLUCOSE 110 (H) 05/25/2017 1147   BUN 10 05/25/2017 1147   CREATININE 0.75 05/25/2017 1147   CALCIUM 9.5 05/25/2017 1147   GFRNONAA 86 05/25/2017 1147   GFRAA 99 05/25/2017 1147   Lab Results  Component Value Date   HGBA1C 5.7 (H) 05/25/2017   Lab Results  Component Value Date   INSULIN 44.2 (H) 05/25/2017   CBC    Component Value Date/Time   WBC 6.0 05/25/2017 1147   RBC 4.46 05/25/2017 1147   HGB 13.6 05/25/2017 1147   HCT 40.7 05/25/2017 1147   MCV 91 05/25/2017 1147   MCH 30.5 05/25/2017 1147   MCHC 33.4 05/25/2017 1147   RDW 13.9 05/25/2017 1147   LYMPHSABS 1.8 05/25/2017 1147   EOSABS 0.2  05/25/2017 1147   BASOSABS 0.0 05/25/2017 1147   Iron/TIBC/Ferritin/ %Sat No results found for: IRON, TIBC, FERRITIN, IRONPCTSAT Lipid Panel     Component Value Date/Time   CHOL 164 05/25/2017 1147   TRIG 75 05/25/2017 1147   HDL 49 05/25/2017 1147   LDLCALC 100 (H) 05/25/2017 1147   Hepatic Function Panel     Component Value Date/Time   PROT 6.8 05/25/2017 1147   ALBUMIN 4.1 05/25/2017 1147   AST 31 05/25/2017 1147   ALT 60 (H) 05/25/2017 1147   ALKPHOS 81 05/25/2017 1147   BILITOT 0.8 05/25/2017 1147      Component Value Date/Time   TSH 2.070 05/25/2017 1147  Results for Lindsey, [name] (MRN [id number]) as of 07/21/2017 14:19  Ref. Range 05/25/2017 11:47  Vitamin D, 25-Hydroxy Latest Ref Range: 30.0 - 100.0 ng/mL 24.1 (L)    ASSESSMENT AND PLAN: Vitamin D deficiency - Plan: Vitamin D, Ergocalciferol, (DRISDOL) 50000 units CAPS capsule  Class 3 severe obesity without serious comorbidity with body mass index (BMI) of 45.0 to 49.9 in adult, unspecified obesity type (HCC)  PLAN:  Vitamin D Deficiency Lindsey [name] that low vitamin D levels contributes to fatigue and are associated with obesity, breast, and colon cancer. Lindsey [name] agrees to continue taking prescription Vit D ,000 IU every week #4 and we will refill for 2 month. She will follow up for routine testing of vitamin D, at least 2-3 times per year. She was informed of the risk of over-replacement of vitamin D and agrees to not increase her dose unless she discusses this with [place] first. Lindsey [name] agrees to follow up with our clinic in 8 weeks.  We spent > than 50% of the 30 minute visit on the counseling as documented in the note.  Obesity Lindsey [name] is currently in the action stage of change. As such, her goal is to continue with weight loss efforts She has agreed to keep a food journal with 400-650 calories and 40 grams of protein at supper daily and follow the Category 3 plan + 300 calories Lindsey [name] has been instructed to work up  to a goal of 150 minutes of combined cardio and strengthening exercise per week for weight loss and overall health benefits. We discussed the following Behavioral Modification Strategies today: increasing lean protein intake, work on meal planning and easy cooking plans, and keep a strict food journal   Lindsey [name] has agreed to follow up with our clinic in 8 weeks. She was informed of the importance of frequent follow up visits to maximize her success  with intensive lifestyle modifications for her multiple health conditions.   OBESITY BEHAVIORAL INTERVENTION VISIT  Today's visit was # 4 out of 22.  Starting weight: 325 lbs Starting date: 05/25/17 Today's weight : 296 lbs  Today's date: 07/20/2017 Total lbs lost to date: 29 (Patients must lose 7 lbs in the first 6 months to continue with counseling)   ASK: We discussed the diagnosis of obesity with Lindsey [name] today and Lindsey [name] agreed to give [place] permission to discuss obesity behavioral modification therapy today.  ASSESS: Lindsey [name] has the diagnosis of obesity and her BMI today is 8.8 Lindsey [name] is in the action stage of change   ADVISE: Lindsey [name] was educated on the multiple health risks of obesity as well as the benefit of weight loss to improve her health. She was advised of the need for long term treatment and the importance of lifestyle modifications.  AGREE: Multiple dietary modification options and treatment options were discussed and  Lindsey [name] agreed to the above obesity treatment plan.  I, Burt Knack, am acting as transcriptionist for Quillian Quince, MD  I have reviewed the above documentation for accuracy and completeness, and I agree with the above. -Quillian Quince, MD

## 2017-08-10 ENCOUNTER — Encounter (INDEPENDENT_AMBULATORY_CARE_PROVIDER_SITE_OTHER): Payer: Self-pay | Admitting: Family Medicine

## 2017-08-18 DIAGNOSIS — M25562 Pain in left knee: Secondary | ICD-10-CM | POA: Diagnosis not present

## 2017-08-18 DIAGNOSIS — M25462 Effusion, left knee: Secondary | ICD-10-CM | POA: Diagnosis not present

## 2017-08-18 DIAGNOSIS — M25461 Effusion, right knee: Secondary | ICD-10-CM | POA: Diagnosis not present

## 2017-08-18 DIAGNOSIS — M25561 Pain in right knee: Secondary | ICD-10-CM | POA: Diagnosis not present

## 2017-09-09 DIAGNOSIS — L821 Other seborrheic keratosis: Secondary | ICD-10-CM | POA: Diagnosis not present

## 2017-09-09 DIAGNOSIS — L309 Dermatitis, unspecified: Secondary | ICD-10-CM | POA: Diagnosis not present

## 2017-09-14 ENCOUNTER — Ambulatory Visit (INDEPENDENT_AMBULATORY_CARE_PROVIDER_SITE_OTHER): Payer: BLUE CROSS/BLUE SHIELD | Admitting: Family Medicine

## 2017-09-14 VITALS — BP 122/73 | HR 70 | Temp 98.2°F | Ht 66.0 in | Wt 281.0 lb

## 2017-09-14 DIAGNOSIS — R7303 Prediabetes: Secondary | ICD-10-CM

## 2017-09-14 DIAGNOSIS — E559 Vitamin D deficiency, unspecified: Secondary | ICD-10-CM | POA: Diagnosis not present

## 2017-09-14 DIAGNOSIS — Z9189 Other specified personal risk factors, not elsewhere classified: Secondary | ICD-10-CM | POA: Diagnosis not present

## 2017-09-14 DIAGNOSIS — Z6841 Body Mass Index (BMI) 40.0 and over, adult: Secondary | ICD-10-CM | POA: Diagnosis not present

## 2017-09-14 MED ORDER — VITAMIN D (ERGOCALCIFEROL) 1.25 MG (50000 UNIT) PO CAPS: 50000.0000 [IU] | ORAL_CAPSULE | ORAL | 0 refills | Status: DC

## 2017-09-14 NOTE — Progress Notes (Signed)
Office: [phone]  /  Fax: [phone]   HPI:   Chief Complaint: OBESITY Lindsey [name] is here to discuss her progress with her obesity treatment plan. She is on the  keep a food journal with 400 to 650 calories and 40 grams of protein at supper daily and the Category 3 plan +300 calories and is following her eating plan approximately 80 % of the time. She states she is exercising 0 minutes 0 times per week. Lindsey [name] was two months ago. She was instructed to be seen in 3 weeks. Lindsey [name] notes deviating from her plan more with increased social eating out and lack of planning ahead. She is journaling on and off and is often not meeting her protein goal. Her weight is 281 lb (127.5 kg) today and has had a weight loss of 15 pounds over a period of 8 weeks since her last [name]. She has lost 44 lbs since starting treatment with Lindsey [name].  Vitamin D deficiency Lindsey [name] has a diagnosis of vitamin D deficiency. Lindsey [name] is stable on vit D, but she is not yet at goal. She denies nausea, vomiting or muscle weakness.  Pre-Diabetes Lindsey [name] has a diagnosis of prediabetes based on her elevated Hgb A1c and was informed this puts her at greater risk of developing diabetes. She is not taking metformin and is attempting to control pre-diabetes with diet. She doesn't want to be on medications. Lindsey [name] continues to work on diet and exercise to decrease risk of diabetes. Polyphagia has decreased and she denies nausea or hypoglycemia.  At risk for diabetes Lindsey [name] is at higher than average risk for developing diabetes due to her obesity and pre-diabetes. She currently denies polyuria or polydipsia.  ALLERGIES: Allergies  Allergen Reactions  . Lactose Intolerance (Gi) Other (See Comments)    Cramping  . Sulfa Antibiotics   . Amoxicillin Rash    MEDICATIONS: Current Outpatient Medications on File Prior to [name]  Medication Sig Dispense Refill  . acyclovir (ZOVIRAX) 400 MG tablet Take 400 mg by mouth 5 (five) times daily.     No  current facility-administered medications on file prior to [name].     PAST MEDICAL HISTORY: Past Medical History:  Diagnosis Date  . Dry skin   . H/O seasonal allergies   . Hair loss   . Knee pain   . Multiple stiff joints   . Nasal congestion   . Skin tag   . Snoring   . Urgency of urination     PAST SURGICAL HISTORY: Past Surgical History:  Procedure Laterality Date  . FOOT SURGERY    . TONSILLECTOMY      SOCIAL HISTORY: Social History   Tobacco Use  . Smoking status: Former Smoker    Packs/day: 1.00    Years: 15.00    Pack years: 15.00    Types: Cigarettes  . Smokeless tobacco: Never Used  . Tobacco comment: quit 10 years ago  Substance Use Topics  . Alcohol use: Yes    Comment: 1-2 drinks a month or less  . Drug use: No    FAMILY HISTORY: Family History  Problem Relation Age of Onset  . High blood pressure Mother   . Depression Mother   . Obesity Mother   . Diabetes Father   . Heart disease Father   . Cancer Father   . Sleep apnea Father   . Obesity Father     ROS: Review of Systems  Constitutional: Positive for weight loss.  Gastrointestinal: Negative for nausea  and vomiting.  Genitourinary: Negative for frequency.  Musculoskeletal:       Negative for muscle weakness  Endo/Heme/Allergies: Negative for polydipsia.       Negative for hypoglycemia Positive for polyphagia    PHYSICAL EXAM: Blood pressure 122/73, pulse 70, temperature 98.2 F (36.8 C), temperature source Oral, height 5\' 6"  (1.676 m), weight 281 lb (127.5 kg), SpO2 96 %. Body mass index is 45.35 kg/m. Physical Exam  Constitutional: She is oriented to person, place, and time. She appears well-developed and well-nourished.  Cardiovascular: Normal rate.  Pulmonary/Chest: Effort normal.  Musculoskeletal: Normal range of motion.  Neurological: She is oriented to person, place, and time.  Skin: Skin is warm and dry.  Psychiatric: She has a normal mood and affect. Her behavior is  normal.  Vitals reviewed.   RECENT LABS AND TESTS: BMET    Component Value Date/Time   NA 144 05/25/2017 1147   K 4.8 05/25/2017 1147   CL 104 05/25/2017 1147   CO2 25 05/25/2017 1147   GLUCOSE 110 (H) 05/25/2017 1147   BUN 10 05/25/2017 1147   CREATININE 0.75 05/25/2017 1147   CALCIUM 9.5 05/25/2017 1147   GFRNONAA 86 05/25/2017 1147   GFRAA 99 05/25/2017 1147   Lab Results  Component Value Date   HGBA1C 5.7 (H) 05/25/2017   Lab Results  Component Value Date   INSULIN 44.2 (H) 05/25/2017   CBC    Component Value Date/Time   WBC 6.0 05/25/2017 1147   RBC 4.46 05/25/2017 1147   HGB 13.6 05/25/2017 1147   HCT 40.7 05/25/2017 1147   MCV 91 05/25/2017 1147   MCH 30.5 05/25/2017 1147   MCHC 33.4 05/25/2017 1147   RDW 13.9 05/25/2017 1147   LYMPHSABS 1.8 05/25/2017 1147   EOSABS 0.2 05/25/2017 1147   BASOSABS 0.0 05/25/2017 1147   Iron/TIBC/Ferritin/ %Sat No results found for: IRON, TIBC, FERRITIN, IRONPCTSAT Lipid Panel     Component Value Date/Time   CHOL 164 05/25/2017 1147   TRIG 75 05/25/2017 1147   HDL 49 05/25/2017 1147   LDLCALC 100 (H) 05/25/2017 1147   Hepatic Function Panel     Component Value Date/Time   PROT 6.8 05/25/2017 1147   ALBUMIN 4.1 05/25/2017 1147   AST 31 05/25/2017 1147   ALT 60 (H) 05/25/2017 1147   ALKPHOS 81 05/25/2017 1147   BILITOT 0.8 05/25/2017 1147      Component Value Date/Time   TSH 2.070 05/25/2017 1147   Results for Lindsey [name] (MRN [id number]) as of 09/14/2017 14:41  Ref. Range 05/25/2017 11:47  Vitamin D, 25-Hydroxy Latest Ref Range: 30.0 - 100.0 ng/mL 24.1 (L)   ASSESSMENT AND PLAN: Vitamin D deficiency - Plan: Vitamin D, Ergocalciferol, (DRISDOL) 50000 units CAPS capsule  Prediabetes  At risk for diabetes mellitus  Class 3 severe obesity with serious comorbidity and body mass index (BMI) of 45.0 to 49.9 in adult, unspecified obesity type (HCC)  PLAN:  Vitamin D Deficiency Lindsey [name] that low  vitamin D levels contributes to fatigue and are associated with obesity, breast, and colon cancer. She agrees to continue to take prescription Vit D @50 ,000 IU every week #8 with no refills. We will recheck labs in 1 month and she will follow up for routine testing of vitamin D, at least 2-3 times per year. She was informed of the risk of over-replacement of vitamin D and agrees to not increase her dose unless she discusses this with [place] first. Lindsey [name] agrees to  follow up as directed.  Pre-Diabetes Lindsey [name] will continue to work on weight loss, exercise, and decreasing simple carbohydrates in her diet to help decrease the risk of diabetes. She was informed that eating too many simple carbohydrates or too many calories at one sitting increases the likelihood of GI side effects. We will recheck labs in 1 month and Lindsey [name] agreed to follow up with [place] as directed to monitor her progress.  Diabetes risk counseling Lindsey [name] was given extended (15 minutes) diabetes prevention counseling today. She is 62 y.o. female and has risk factors for diabetes including obesity and pre-diabetes. We discussed intensive lifestyle modifications today with an emphasis on weight loss as well as increasing exercise and decreasing simple carbohydrates in her diet.  Obesity Lindsey [name] is currently in the action stage of change. As such, her goal is to continue with weight loss efforts She has agreed to keep a food journal with 400 to 650 calories and 40 grams of protein at supper daily and follow the Category 3 plan +300 calories Lindsey [name] has been instructed to work up to a goal of 150 minutes of combined cardio and strengthening exercise per week or start walking or exercising 10 to 15 minutes per day for weight loss and overall health benefits. We discussed the following Behavioral Modification Strategies today: keep a strict food journal, increasing lean protein intake, work on meal planning and easy cooking plans and emotional eating  strategies  Lindsey [name] has agreed to follow up with our clinic in 3 to 4 weeks. She was informed of the importance of frequent follow up visits to maximize her success with intensive lifestyle modifications for her multiple health conditions.   OBESITY BEHAVIORAL INTERVENTION [name]  Today's [name] was # 5 out of 22.  Starting weight: 325 lbs Starting date: 05/25/17 Today's weight : 281 lbs Today's date: 09/14/2017 Total lbs lost to date: 14 (Patients must lose 7 lbs in the first 6 months to continue with counseling)   ASK: We discussed the diagnosis of obesity with Lindsey [name] today and Lindsey [name] agreed to give [place] permission to discuss obesity behavioral modification therapy today.  ASSESS: Lindsey [name] has the diagnosis of obesity and her BMI today is 32.38 Lindsey [name] is in the action stage of change   ADVISE: Taralee was educated on the multiple health risks of obesity as well as the benefit of weight loss to improve her health. She was advised of the need for long term treatment and the importance of lifestyle modifications.  AGREE: Multiple dietary modification options and treatment options were discussed and  Journei agreed to the above obesity treatment plan.  I, [place] Crane, am acting as transcriptionist for Quillian Quince, MD  I have reviewed the above documentation for accuracy and completeness, and I agree with the above. -Quillian Quince, MD

## 2017-10-12 ENCOUNTER — Ambulatory Visit (INDEPENDENT_AMBULATORY_CARE_PROVIDER_SITE_OTHER): Payer: BLUE CROSS/BLUE SHIELD | Admitting: Family Medicine

## 2017-10-12 VITALS — Ht 66.0 in | Wt 271.0 lb

## 2017-10-12 DIAGNOSIS — E559 Vitamin D deficiency, unspecified: Secondary | ICD-10-CM

## 2017-10-12 DIAGNOSIS — F3289 Other specified depressive episodes: Secondary | ICD-10-CM | POA: Diagnosis not present

## 2017-10-12 DIAGNOSIS — Z6841 Body Mass Index (BMI) 40.0 and over, adult: Secondary | ICD-10-CM

## 2017-10-12 DIAGNOSIS — R7303 Prediabetes: Secondary | ICD-10-CM

## 2017-10-12 DIAGNOSIS — Z9189 Other specified personal risk factors, not elsewhere classified: Secondary | ICD-10-CM | POA: Diagnosis not present

## 2017-10-12 DIAGNOSIS — E7849 Other hyperlipidemia: Secondary | ICD-10-CM | POA: Diagnosis not present

## 2017-10-12 NOTE — Progress Notes (Signed)
Office: [phone]  /  Fax: [phone]   HPI:   Chief Complaint: OBESITY Lindsey [name] is here to discuss her progress with her obesity treatment plan. She is on the keep a food journal with 400 to 650 calories and 40 grams of protein at supper daily and the Category 3 plan +300 calories and is following her eating plan approximately 75 % of the time. She states she is walking 30 minutes 4 times per week. Lindsey [name] continues to do well with weight loss on her eating plan. She doesn't want to know her weight, due to obsessive feelings in the past about her weight. Her weight is 271 lb (122.9 kg) today and has had a weight loss of 10 pounds over a period of 4 weeks since her last visit. She has lost 54 lbs since starting treatment with [place].  Pre-Diabetes Lindsey [name] has a diagnosis of prediabetes based on her elevated Hgb A1c and was informed this puts her at greater risk of developing diabetes. Lindsey [name] is not taking metformin and she is attempting to diet control. Lindsey [name] continues to work on diet and exercise to decrease risk of diabetes. She denies nausea, vomiting or hypoglycemia.  At risk for diabetes Lindsey [name] is at higher than average risk for developing diabetes due to her obesity and pre-diabetes. She currently denies polyuria or polydipsia.  Vitamin D deficiency Lindsey [name] has a diagnosis of vitamin D deficiency. She is on prescription vit D and is due for labs. Lindsey [name] denies nausea, vomiting or muscle weakness.  Hyperlipidemia Lindsey [name] has hyperlipidemia. She has mildly elevated LDL. Lindsey [name] is attempting to improve her cholesterol levels with intensive lifestyle modification including a low saturated fat diet, exercise and weight loss. She denies any chest pain, claudication or myalgias. Lindsey [name] is due for labs.  Emotional Eating Lindsey [name] is frustrated that she is still giving into temptations frequently, especially in social situations.  ALLERGIES: Allergies  Allergen Reactions  . Lactose Intolerance (Gi) Other (See  Comments)    Cramping  . Sulfa Antibiotics   . Amoxicillin Rash    MEDICATIONS: Current Outpatient Medications on File Prior to Visit  Medication Sig Dispense Refill  . Vitamin D, Ergocalciferol, (DRISDOL) 50000 units CAPS capsule Take 1 capsule (50,000 Units total) by mouth every 7 (seven) days. 8 capsule 0   No current facility-administered medications on file prior to visit.     PAST MEDICAL HISTORY: Past Medical History:  Diagnosis Date  . Dry skin   . H/O seasonal allergies   . Hair loss   . Knee pain   . Multiple stiff joints   . Nasal congestion   . Skin tag   . Snoring   . Urgency of urination     PAST SURGICAL HISTORY: Past Surgical History:  Procedure Laterality Date  . FOOT SURGERY    . TONSILLECTOMY      SOCIAL HISTORY: Social History   Tobacco Use  . Smoking status: Former Smoker    Packs/day: 1.00    Years: 15.00    Pack years: 15.00    Types: Cigarettes  . Smokeless tobacco: Never Used  . Tobacco comment: quit 10 years ago  Substance Use Topics  . Alcohol use: Yes    Comment: 1-2 drinks a month or less  . Drug use: No    FAMILY HISTORY: Family History  Problem Relation Age of Onset  . High blood pressure Mother   . Depression Mother   . Obesity Mother   . Diabetes Father   .  Heart disease Father   . Cancer Father   . Sleep apnea Father   . Obesity Father     ROS: Review of Systems  Constitutional: Positive for weight loss.  Cardiovascular: Negative for chest pain and claudication.  Gastrointestinal: Negative for nausea and vomiting.  Genitourinary: Negative for frequency.  Musculoskeletal: Negative for myalgias.       Negative for muscle weakness  Endo/Heme/Allergies: Negative for polydipsia.       Negative for hypoglycemia    PHYSICAL EXAM: Height 5\' 6"  (1.676 m), weight 271 lb (122.9 kg). Body mass index is 43.74 kg/m. Physical Exam  Constitutional: She is oriented to person, place, and time. She appears well-developed  and well-nourished.  Cardiovascular: Normal rate.  Pulmonary/Chest: Effort normal.  Musculoskeletal: Normal range of motion.  Neurological: She is oriented to person, place, and time.  Skin: Skin is warm and dry.  Psychiatric: She has a normal mood and affect. Her behavior is normal.  Vitals reviewed.   RECENT LABS AND TESTS: BMET    Component Value Date/Time   NA 144 05/25/2017 1147   K 4.8 05/25/2017 1147   CL 104 05/25/2017 1147   CO2 25 05/25/2017 1147   GLUCOSE 110 (H) 05/25/2017 1147   BUN 10 05/25/2017 1147   CREATININE 0.75 05/25/2017 1147   CALCIUM 9.5 05/25/2017 1147   GFRNONAA 86 05/25/2017 1147   GFRAA 99 05/25/2017 1147   Lab Results  Component Value Date   HGBA1C 5.7 (H) 05/25/2017   Lab Results  Component Value Date   INSULIN 44.2 (H) 05/25/2017   CBC    Component Value Date/Time   WBC 6.0 05/25/2017 1147   RBC 4.46 05/25/2017 1147   HGB 13.6 05/25/2017 1147   HCT 40.7 05/25/2017 1147   MCV 91 05/25/2017 1147   MCH 30.5 05/25/2017 1147   MCHC 33.4 05/25/2017 1147   RDW 13.9 05/25/2017 1147   LYMPHSABS 1.8 05/25/2017 1147   EOSABS 0.2 05/25/2017 1147   BASOSABS 0.0 05/25/2017 1147   Iron/TIBC/Ferritin/ %Sat No results found for: IRON, TIBC, FERRITIN, IRONPCTSAT Lipid Panel     Component Value Date/Time   CHOL 164 05/25/2017 1147   TRIG 75 05/25/2017 1147   HDL 49 05/25/2017 1147   LDLCALC 100 (H) 05/25/2017 1147   Hepatic Function Panel     Component Value Date/Time   PROT 6.8 05/25/2017 1147   ALBUMIN 4.1 05/25/2017 1147   AST 31 05/25/2017 1147   ALT 60 (H) 05/25/2017 1147   ALKPHOS 81 05/25/2017 1147   BILITOT 0.8 05/25/2017 1147      Component Value Date/Time   TSH 2.070 05/25/2017 1147   Results for DIANA, DAVENPORT (MRN [id number]) as of 10/12/2017 11:35  Ref. Range 05/25/2017 11:47  Vitamin D, 25-Hydroxy Latest Ref Range: 30.0 - 100.0 ng/mL 24.1 (L)   ASSESSMENT AND PLAN: Prediabetes - Plan: Comprehensive metabolic panel,  Hemoglobin A1c, Insulin, random  Vitamin D deficiency - Plan: VITAMIN D 25 Hydroxy (Vit-D Deficiency, Fractures)  Other hyperlipidemia - Plan: Lipid Panel With LDL/HDL Ratio  Other depression - with emotional eating  At risk for diabetes mellitus  Class 3 severe obesity with serious comorbidity and body mass index (BMI) of 40.0 to 44.9 in adult, unspecified obesity type [facility])  PLAN:  Pre-Diabetes [place] will continue to work on weight loss, exercise, and decreasing simple carbohydrates in her diet to help decrease the risk of diabetes. She was informed that eating too many simple carbohydrates or too many calories  at one sitting increases the likelihood of GI side effects. We will check labs and Erskine SquibbJane agreed to follow up with us as directed to monitor her progress.  Diabetes risk counseling Erskine SquibbJane was given extended (15 minutes) diabetes prevention counseling today. She is 62 y.o. female and has risk factors for diabetes including obesity and pre-diabetes. We discussed intensive lifestyle modifications today with an emphasis on weight loss as well as increasing exercise and decreasing simple carbohydrates in her diet.  Vitamin D Deficiency Erskine SquibbJane was informed that low vitamin D levels contributes to fatigue and are associated with obesity, breast, and colon cancer. She agrees to continue to take prescription Vit D @50 ,000 IU every week. We will check labs and she will follow up for routine testing of vitamin D, at least 2-3 times per year. She was informed of the risk of over-replacement of vitamin D and agrees to not increase her dose unless she discusses this with us first.  Hyperlipidemia Erskine SquibbJane was informed of the American Heart Association Guidelines emphasizing intensive lifestyle modifications as the first line treatment for hyperlipidemia. We discussed many lifestyle modifications today in depth, and Erskine SquibbJane will continue to work on decreasing saturated fats such as fatty red meat, butter and  many fried foods. She will also increase vegetables and lean protein in her diet and continue to work on exercise and weight loss efforts. We will check labs and Erskine SquibbJane will follow up at the agreed upon time.-+  Emotional Eating We will refer to Dr. Dewaine CongerBarker our Bariatric Psychologist for evaluation and possible therapy.  Obesity Erskine SquibbJane is currently in the action stage of change. As such, her goal is to continue with weight loss efforts She has agreed to keep a food journal with 400 to 650 calories and 40 grams of protein at supper daily and follow the Category 3 plan Erskine SquibbJane has been instructed to work up to a goal of 150 minutes of combined cardio and strengthening exercise per week for weight loss and overall health benefits. We discussed the following Behavioral Modification Strategies today: increasing lean protein intake, decreasing simple carbohydrates  and work on meal planning and easy cooking plans  Erskine SquibbJane has agreed to follow up with our clinic in 4 weeks and follow up with Dr. Dewaine CongerBarker our Bariatric Psychologist in 2 weeks. Marland Kitchen. She was informed of the importance of frequent follow up visits to maximize her success with intensive lifestyle modifications for her multiple health conditions.   OBESITY BEHAVIORAL INTERVENTION VISIT  Today's visit was # 6 out of 22.  Starting weight: 325 lbs Starting date: 05/25/17 Today's weight : 271 lbs  Today's date: 10/12/2017 Total lbs lost to date: 3754    ASK: We discussed the diagnosis of obesity with Georg RuddleJane E Giangrande today and Erskine SquibbJane agreed to give us permission to discuss obesity behavioral modification therapy today.  ASSESS: Erskine SquibbJane has the diagnosis of obesity and her BMI today is 43.76 Erskine SquibbJane is in the action stage of change   ADVISE: Erskine SquibbJane was educated on the multiple health risks of obesity as well as the benefit of weight loss to improve her health. She was advised of the need for long term treatment and the importance of lifestyle  modifications.  AGREE: Multiple dietary modification options and treatment options were discussed and  Erskine SquibbJane agreed to the above obesity treatment plan.  I, [place] CraneJoanne Murray, am acting as transcriptionist for Quillian Quincearen Aden Sek, MD  I have reviewed the above documentation for accuracy and completeness, and I agree with the above. -  Dennard Nip, MD

## 2017-10-12 NOTE — Progress Notes (Unsigned)
Office: [phone]  /  Fax: [phone] Date: October 26, 2017 Time Seen:*** Duration:*** Provider: Lawerance CruelGaytri Darolyn Double, PsyD Type of Session: Intake for Individual Therapy   Informed Consent:The provider's role was explained to Lindsey RuddleJane E [name]. The provider discussed issues of confidentiality, privacy, and limits therein; anticipated course of treatment; potential risks involved with psychotherapy; the voluntary nature of treatment; and the clinic's cancellation policy. The provider also discussed billing, as it relates to insurance and the patient's responsibility. In addition to written consent, verbal informed consent for psychological services was obtained from [name] prior to the initial intake interview.   Lindsey [name] that information about mental health appointments will be entered in the medical record at [facility]) via Epic. Moreover, Lindsey [name] agreed information may be shared with other [facility] providers as needed for coordination of care. Written consent was also provided for this provider to coordinate care with other providers at [facility].The provider further explained leaving voicemail messages and sending messages via MyChart can be utilized for non-emergency reasons, and limits of confidentiality related to communication via technology was discussed. Furthermore, Lindsey [name] the clinic is not a 24/7 crisis center and mental health emergency resources were shared. Lindsey [name] was given a handout with emergency resources. Lindsey [name] verbally acknowledged understanding, and agreed to use mental health emergency resources discussed if needed.   Chief Complaint: Lindsey [name] was referred by [name] for emotional eating. Per the note for the office visit with [name] on October 12, 2017, "Lindsey [name] is frustrated that she is still giving into temptations frequently, especially in social situations."  Lindsey [name] was asked to complete a  questionnaire assessing various behaviors related to emotional eating. Lindsey [name] endorsed the following: *** Overeat when you are celebrating Experience food cravings on a regular basis Eat certain foods when you are anxious, stressed, depressed, or your feelings are hurt Use food to help you cope with emotional situations Find food is comforting to you Overeat when you are angry or upset Overeat when you are worried about something Overeat frequently when you are bored or lonely Not worry about what you eat when you are in a good mood Overeat when you are angry at someone just to show them they cannot control you Overeat when you are alone, but eat much less when you are with other people Eat to help you stay awake Eat as a reward  HPI: Per the note for the initial visit with [name] on May 25, 2017, Lindsey [name] started gaining weight 62 years old. She also reported the following the initial appointment: significant food cravings issues; snacking frequently in the evenings; waking up frquently in the middle of the night to eat; skipping meals frequently; frequently drinking liquids with calories; frequently making poor food choices; having problems with excessive hunger; frequently eating larger portions than normal; binge eating behaviors; and struggling with emotional eating. [name]'s Food and Mood (modified PHQ-9) score was 15 at the initial appointment.   Mental Status Examination: Lindsey [name] arrived on time for the appointment. She presented as appropriately dressed and groomed. Lindsey [name] appeared her stated age and demonstrated adequate orientation to time, place, person, and purpose of the appointment. She also demonstrated appropriate eye contact. No psychomotor abnormalities or behavioral peculiarities noted. Her mood was *** with congruent affect. Her thought processes were logical, linear, and goal-directed. No hallucinations, delusions, bizarre thinking or behavior reported or observed. Judgment, insight,  and impulse control appeared to be grossly intact. There  was no evidence of paraphasias (i.e., errors in speech, gross mispronunciations, and word substitutions), repetition deficits, or disturbances in volume or prosody (i.e., rhythm and intonation). There was no evidence of attention or memory impairments. [name] denied current suicidal and homicidal ideation, plan, and intent.   The Montreal Cognitive Assessment (MoCA) was administered. The MoCA assesses different cognitive domains: attention and concentration, executive functions, memory, language, visuoconstructional skills, conceptual thinking, calculations, and orientation. [name] received *** out of 30 points possible on the MoCA, which is noted in the *** range. ***[A point was added to the total score due to years of formal education being 12 years or fewer.] The following points were lost: ***  Family & Psychosocial History: ***  Medical History: ***  Mental Health History: ***  Structured Assessment Results: The Patient Health Questionnaire-9 (PHQ-9) is a self-report measure that assesses symptoms and severity of depression over the course of the last two weeks. [name] obtained a score of *** suggesting *** depression. [name] finds the endorsed symptoms to be ***.    The Generalized Anxiety Disorder-7 (GAD-7) is a brief self-report measure that assesses symptoms of anxiety over the course of the last two weeks. [name] obtained a score of *** suggesting *** anxiety.    Interventions: A chart review was conducted prior to the clinical intake interview. The MoCA, PHQ-9, and GAD-7 were administered and a clinical intake interview was completed. In addition, [name] was asked to complete a Mood and Food questionnaire to assess various behaviors related to emotional eating. Throughout session, empathic reflections and validation was provided. Continuing treatment with this provider was discussed and treatment goals were established.  Provisional DSM-5  Diagnosis:***  Plan: Eva expressed understanding and agreement with the initial treatment plan of care. She appears able and willing to participate as evidenced by collaboration on treatment goals, engagement in reciprocal conversation, and asking questions as needed for clarification. The next appointment will be scheduled in *** week(s). The following treatment goals were established: ***.

## 2017-10-13 LAB — VITAMIN D 25 HYDROXY (VIT D DEFICIENCY, FRACTURES): Vit D, 25-Hydroxy: 42.1 ng/mL (ref 30.0–100.0)

## 2017-10-13 LAB — COMPREHENSIVE METABOLIC PANEL
A/G RATIO: 2 (ref 1.2–2.2)
ALT: 29 IU/L (ref 0–32)
AST: 21 IU/L (ref 0–40)
Albumin: 4.5 g/dL (ref 3.6–4.8)
Alkaline Phosphatase: 74 IU/L (ref 39–117)
BILIRUBIN TOTAL: 1.1 mg/dL (ref 0.0–1.2)
BUN/Creatinine Ratio: 20 (ref 12–28)
BUN: 14 mg/dL (ref 8–27)
CHLORIDE: 100 mmol/L (ref 96–106)
CO2: 25 mmol/L (ref 20–29)
Calcium: 9.6 mg/dL (ref 8.7–10.3)
Creatinine, Ser: 0.71 mg/dL (ref 0.57–1.00)
GFR, EST AFRICAN AMERICAN: 106 mL/min/{1.73_m2} (ref >59)
GFR, EST NON AFRICAN AMERICAN: 92 mL/min/{1.73_m2} (ref >59)
GLOBULIN, TOTAL: 2.2 g/dL (ref 1.5–4.5)
Glucose: 92 mg/dL (ref 65–99)
POTASSIUM: 4.3 mmol/L (ref 3.5–5.2)
Sodium: 143 mmol/L (ref 134–144)
Total Protein: 6.7 g/dL (ref 6.0–8.5)

## 2017-10-13 LAB — HEMOGLOBIN A1C
Est. average glucose Bld gHb Est-mCnc: 111 mg/dL
Hgb A1c MFr Bld: 5.5 % (ref 4.8–5.6)

## 2017-10-13 LAB — LIPID PANEL WITH LDL/HDL RATIO
CHOLESTEROL TOTAL: 150 mg/dL (ref 100–199)
HDL: 51 mg/dL (ref >39)
LDL CALC: 84 mg/dL (ref 0–99)
LDL/HDL RATIO: 1.6 ratio (ref 0.0–3.2)
TRIGLYCERIDES: 74 mg/dL (ref 0–149)
VLDL Cholesterol Cal: 15 mg/dL (ref 5–40)

## 2017-10-13 LAB — INSULIN, RANDOM: INSULIN: 19.1 u[IU]/mL (ref 2.6–24.9)

## 2017-10-18 ENCOUNTER — Encounter (INDEPENDENT_AMBULATORY_CARE_PROVIDER_SITE_OTHER): Payer: Self-pay | Admitting: Family Medicine

## 2017-10-26 ENCOUNTER — Ambulatory Visit (INDEPENDENT_AMBULATORY_CARE_PROVIDER_SITE_OTHER): Payer: Self-pay | Admitting: Psychology

## 2017-10-27 NOTE — Progress Notes (Addendum)
Office: [phone]  /  Fax: [phone] Date: November 02, 2017 Time Seen: 2:05pm Duration: 75 minutes Provider: Lawerance Cruel, PsyD Type of Session: Intake for Individual Therapy   Informed Consent:The provider's role was explained to [name]. The provider discussed issues of confidentiality, privacy, and limits therein; anticipated course of treatment; potential risks involved with psychotherapy; the voluntary nature of treatment; and the clinic's cancellation policy. In addition to written consent, verbal informed consent for psychological services was obtained from [facility] prior to the initial intake interview.   Anjelita was informed that information about mental health appointments will be entered in the medical record at [facility]) via Epic. Moreover, Maison agreed information may be shared with other [facility] providers as needed for coordination of care. Written consent was also provided for this provider to coordinate care with other providers at [facility].The provider further explained leaving voicemail messages and sending messages via MyChart can be utilized for non-emergency reasons, and limits of confidentiality related to communication via technology was discussed. Furthermore, Junell was informed the clinic is not a 24/7 crisis center and mental health emergency resources were shared. Anabia was given a handout with emergency resources. Shelbee verbally acknowledged understanding, and agreed to use mental health emergency resources discussed if needed.   Chief Complaint: Akire was referred by Dr. Quillian Quince due to emotional eating . Per the note for the last visit with Dr. Dalbert Garnet on October 12, 2017, "Margarete is frustrated that she is still giving into temptations frequently, especially in social situations." Leilany shared she does not want to know about her weight loss at this time, as she has a history of "being obsessive" about the  number. Pauleen expressed concern about "falling into the same pattern." She described feeling like she is "waiting for the other shoe to drop." She noted, "I let failure depress me."   Hailey was asked to complete a questionnaire assessing various behaviors related to emotional eating. Shabree endorsed the following:  Overeat when you are celebrating Experience food cravings on a regular basis Eat certain foods when you are anxious, stressed, depressed, or your feelings are hurt Use food to help you cope with emotional situations Find food is comforting to you Overeat frequently when you are bored or lonely Not worry about what you eat when you are in a good mood Overeat when you are angry at someone just to show them they cannot control you Overeat when you are alone, but eat much less when you are with other people Eat to help you stay awake Eat as a reward  HPI: Per the note for the initial visit with Dr. Dalbert Garnet on May 25, 2017, she started gaining weight 62 years old. She reported experiencing the following: significant food cravings issues; snacking frequently in the evenings; waking up frquently in the middle of the night to eat; skipping meals frequently; frequently drinking liquids with calories; frequently making poor food choices; problems with excessive hunger; frequently eating larger portions than normal; binge eating behaviors; and struggling with emotional eating. Tawnie shared she came from a family where her parents felt they were overweight, which was "always an issue for them." Growing up, she felt "deprived" of certain foods.   Mental Status Examination: Selma arrived on time for the appointment; however, the appointment was initiated late due to a delay in the check-in policy. She presented as appropriately dressed and groomed. Zelene appeared her stated age and demonstrated adequate orientation  to time, place, person, and purpose of the appointment. She also demonstrated appropriate eye  contact. No psychomotor abnormalities or behavioral peculiarities noted. Her mood was euthymic with congruent affect. Her thought processes were logical, linear, and goal-directed. No hallucinations, delusions, bizarre thinking or behavior reported or observed. Judgment, insight, and impulse control appeared to be grossly intact. There was no evidence of paraphasias (i.e., errors in speech, gross mispronunciations, and word substitutions), repetition deficits, or disturbances in volume or prosody (i.e., rhythm and intonation). There was no evidence of attention or memory impairments. Erskine SquibbJane denied current suicidal and homicidal ideation, plan, and intent.   The Montreal Cognitive Assessment (MoCA) was administered. The MoCA assesses different cognitive domains: attention and concentration, executive functions, memory, language, visuoconstructional skills, conceptual thinking, calculations, and orientation. Jet received 29 out of 30 points possible on the MoCA, which is noted in the normal range. A point was lost on an attention task requiring Erskine SquibbJane to repeat a series of number in the backward order.  Family & Psychosocial History: Erskine SquibbJane shared she is not in a relationship. She indicated she has never been married and does not have any children. 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 Erskine SquibbJane noted, "I'm not having a lot of success with that and that is a stressor." She indicated her highest level of education is a bachelor's degree. Erskine SquibbJane noted her social support consists of her mother and dog. Kamia added, "I'm pretty isolated. I'm an introvert."   Medical History:. Past Medical History:  Diagnosis Date  . Dry skin   . H/O seasonal allergies   . Hair loss   . Knee pain   . Multiple stiff joints   . Nasal congestion   . Skin tag   . Snoring   . Urgency of urination    Past Surgical History:  Procedure Laterality Date  . FOOT SURGERY    .  TONSILLECTOMY     Current Outpatient Medications on File Prior to Visit  Medication Sig Dispense Refill  . Vitamin D, Ergocalciferol, (DRISDOL) 50000 units CAPS capsule Take 1 capsule (50,000 Units total) by mouth every 7 (seven) days. 8 capsule 0   No current facility-administered medications on file prior to visit.   Erskine SquibbJane denied a history of head injuries, and indicated she has "fainted lots of times." She described fainting due to becoming overheated and being "squimish" when it comes to blood.   Mental Health History: Erskine SquibbJane first received therapy as a teenager due to social anxiety. She indicated it was encouraged by her mother, but she was very "resistant." She attended therapy in college when she was "struggling academically." Erskine SquibbJane reported she was in therapy on and off during the 1990s. The last time she was in therapy was before 1995, which included individual and group therapy. Erskine SquibbJane denied hospitalization for psychiatric concerns and has never seen a psychiatrist; however, she indicated she was previously prescribed Paxil. She could not recall who prescribed it. Erskine SquibbJane reported her mother "struggles with depression." Erskine SquibbJane denied a history of trauma, including sexual, physical, and psychological abuse, as well as neglect. Currently, Erskine SquibbJane noted she experiences overeating; low self-esteem; trouble concentrating; worry about money, aging, professional, weight, health; and compulsions about losing weight and checking if the door is locked.  Erskine SquibbJane denied the following: sleep issues; hallucinations and delusions; mania; substance use; engagement in self-harm; and denied a history of and current homicidal ideation, plan, and intent. Regarding suicidal ideation, Erskine SquibbJane  indicated experiencing ideation, but added, "If I did it, I would be dead and would not experience any benefit from it." She denied experiencing plan and intent. Naiah indicated she last experienced ideation "couple months ago." She clarified, it  is in th form of "If I was dead, I wouldn't have to worry any more." Alexsia denied current suicidal ideation, plan, and intent. Jaiyla further added, "I'm far too self-absorbed to do anything."  She identified the following coping skills that could be utilized when feeling overwhelmed: go to sleep, read, watch TV, pet her dog, and find ways to distract herself. Tenee's confidence in utilizing emergency resources provided should thoughts intensify was assessed on a scale of one to ten, where one is not confident and ten is extremely confident. She reported her confidence as a 10.   Structured Assessment Results: The Patient Health Questionnaire-9 (PHQ-9) is a self-report measure that assesses symptoms and severity of depression over the course of the last two weeks. Xuan obtained a score of three suggesting minimal depression. Karma finds the endorsed symptoms to be somewhat difficult.  Depression screen [facility] 2/9 11/02/2017  Decreased Interest 0  Down, Depressed, Hopeless 0  PHQ - 2 Score 0  Altered sleeping 0  Tired, decreased energy 0  Change in appetite 1  Feeling bad or failure about yourself  1  Trouble concentrating 1  Moving slowly or fidgety/restless 0  Suicidal thoughts 0  PHQ-9 Score 3  Difficult doing work/chores -   The Generalized Anxiety Disorder-7 (GAD-7) is a brief self-report measure that assesses symptoms of anxiety over the course of the last two weeks. Mychelle obtained a score of eight suggesting mild anxiety.  GAD 7 : Generalized Anxiety Score 11/02/2017  Nervous, Anxious, on Edge 1  Control/stop worrying 1  Worry too much - different things 1  Trouble relaxing 1  Restless 1  Easily annoyed or irritable 2  Afraid - awful might happen 1  Total GAD 7 Score 8  Anxiety Difficulty Very difficult   Interventions: A chart review was conducted prior to the clinical intake interview. The MoCA, PHQ-9, and GAD-7 were administered and a clinical intake interview was completed. In addition,  Audris was asked to complete a Mood and Food questionnaire to assess various behaviors related to emotional eating. Throughout session, empathic reflections and validation was provided. Continuing treatment with this provider was discussed and a treatment goal was established.  Provisional DSM-5 Diagnosis: 300.09 (F41.8) Other Specified Anxiety Disorder, Generalized anxiety not occurring more days than not and emotional eating behaviors  Plan: Shalon expressed understanding and agreement with the initial treatment plan of care. She appears able and willing to participate as evidenced by collaboration on a treatment goal, engagement in reciprocal conversation, and asking questions as needed for clarification. The next appointment will be scheduled in two weeks. The following treatment goal was established: reduce emotional eating.

## 2017-11-02 ENCOUNTER — Ambulatory Visit (INDEPENDENT_AMBULATORY_CARE_PROVIDER_SITE_OTHER): Payer: BLUE CROSS/BLUE SHIELD | Admitting: Psychology

## 2017-11-02 DIAGNOSIS — F418 Other specified anxiety disorders: Secondary | ICD-10-CM

## 2017-11-09 ENCOUNTER — Ambulatory Visit (INDEPENDENT_AMBULATORY_CARE_PROVIDER_SITE_OTHER): Payer: BLUE CROSS/BLUE SHIELD | Admitting: Family Medicine

## 2017-11-09 VITALS — BP 112/67 | HR 71 | Temp 98.4°F | Ht 66.0 in | Wt 268.0 lb

## 2017-11-09 DIAGNOSIS — E559 Vitamin D deficiency, unspecified: Secondary | ICD-10-CM

## 2017-11-09 DIAGNOSIS — Z6841 Body Mass Index (BMI) 40.0 and over, adult: Secondary | ICD-10-CM

## 2017-11-09 DIAGNOSIS — R7303 Prediabetes: Secondary | ICD-10-CM

## 2017-11-09 DIAGNOSIS — Z9189 Other specified personal risk factors, not elsewhere classified: Secondary | ICD-10-CM

## 2017-11-09 MED ORDER — VITAMIN D (ERGOCALCIFEROL) 1.25 MG (50000 UNIT) PO CAPS: 50000.0000 [IU] | ORAL_CAPSULE | ORAL | 0 refills | Status: DC

## 2017-11-09 NOTE — Progress Notes (Signed)
Office: [phone]  /  Fax: [phone]   HPI:   Chief Complaint: OBESITY Lindsey [name] is here to discuss her progress with her obesity treatment plan. She is on the keep a food journal with 400 to 650 calories and 40 grams of protein at supper daily and the Category 3 plan and is following her eating plan approximately 85 % of the time. She states she is walking 30 minutes 4 to 5 times per week. Lindsey [name] continues to do well with weight loss, but she is frustrated that her weight loss is slowing down. She has lost weight at a faster rate than is recommended and she has likely dropped her resting metabolic rate. Her weight is 268 lb (121.6 kg) today and has had a weight loss of 3 pounds over a period of 4 weeks since her last visit. She has lost 57 lbs since starting treatment with Lindsey [name].  Vitamin D deficiency Lindsey [name] has a diagnosis of vitamin D deficiency. Lindsey [name] is stable on vit D, but she is not yet at goal. She denies nausea, vomiting or muscle weakness.  Pre-Diabetes Lindsey [name] has a diagnosis of prediabetes and she was informed this puts her at greater risk of developing diabetes. She is not taking metformin currently and continues to work on diet and exercise to decrease risk of diabetes. Her A1c is now at goal. She denies nausea, vomiting or hypoglycemia.  At risk for diabetes Lindsey [name] is at higher than average risk for developing diabetes due to her obesity and prediabetes. She currently denies polyuria or polydipsia.  ALLERGIES: Allergies  Allergen Reactions  . Lactose Intolerance (Gi) Other (See Comments)    Cramping  . Sulfa Antibiotics   . Amoxicillin Rash    MEDICATIONS: No current outpatient medications on file prior to visit.   No current facility-administered medications on file prior to visit.     PAST MEDICAL HISTORY: Past Medical History:  Diagnosis Date  . Dry skin   . H/O seasonal allergies   . Hair loss   . Knee pain   . Multiple stiff joints   . Nasal congestion   . Skin  tag   . Snoring   . Urgency of urination     PAST SURGICAL HISTORY: Past Surgical History:  Procedure Laterality Date  . FOOT SURGERY    . TONSILLECTOMY      SOCIAL HISTORY: Social History   Tobacco Use  . Smoking status: Former Smoker    Packs/day: 1.00    Years: 15.00    Pack years: 15.00    Types: Cigarettes  . Smokeless tobacco: Never Used  . Tobacco comment: quit 10 years ago  Substance Use Topics  . Alcohol use: Yes    Comment: 1-2 drinks a month or less  . Drug use: No    FAMILY HISTORY: Family History  Problem Relation Age of Onset  . High blood pressure Mother   . Depression Mother   . Obesity Mother   . Diabetes Father   . Heart disease Father   . Cancer Father   . Sleep apnea Father   . Obesity Father     ROS: Review of Systems  Constitutional: Positive for weight loss.  Gastrointestinal: Negative for nausea and vomiting.  Genitourinary: Negative for frequency.  Musculoskeletal:       Negative for muscle weakness  Endo/Heme/Allergies: Negative for polydipsia.       Negative for hypoglycemia    PHYSICAL EXAM: Blood pressure 112/67, pulse 71, temperature 98.4 F (36.9  C), temperature source Oral, height 5\' 6"  (1.676 m), weight 268 lb (121.6 kg), SpO2 96 %. Body mass index is 43.26 kg/m. Physical Exam  Constitutional: She is oriented to person, place, and time. She appears well-developed and well-nourished.  Cardiovascular: Normal rate.  Pulmonary/Chest: Effort normal.  Musculoskeletal: Normal range of motion.  Neurological: She is oriented to person, place, and time.  Skin: Skin is warm and dry.  Psychiatric: She has a normal mood and affect. Her behavior is normal.  Vitals reviewed.   RECENT LABS AND TESTS: BMET    Component Value Date/Time   NA 143 10/12/2017 1000   K 4.3 10/12/2017 1000   CL 100 10/12/2017 1000   CO2 25 10/12/2017 1000   GLUCOSE 92 10/12/2017 1000   BUN 14 10/12/2017 1000   CREATININE 0.71 10/12/2017 1000    CALCIUM 9.6 10/12/2017 1000   GFRNONAA 92 10/12/2017 1000   GFRAA 106 10/12/2017 1000   Lab Results  Component Value Date   HGBA1C 5.5 10/12/2017   HGBA1C 5.7 (H) 05/25/2017   Lab Results  Component Value Date   INSULIN 19.1 10/12/2017   INSULIN 44.2 (H) 05/25/2017   CBC    Component Value Date/Time   WBC 6.0 05/25/2017 1147   RBC 4.46 05/25/2017 1147   HGB 13.6 05/25/2017 1147   HCT 40.7 05/25/2017 1147   MCV 91 05/25/2017 1147   MCH 30.5 05/25/2017 1147   MCHC 33.4 05/25/2017 1147   RDW 13.9 05/25/2017 1147   LYMPHSABS 1.8 05/25/2017 1147   EOSABS 0.2 05/25/2017 1147   BASOSABS 0.0 05/25/2017 1147   Iron/TIBC/Ferritin/ %Sat No results found for: IRON, TIBC, FERRITIN, IRONPCTSAT Lipid Panel     Component Value Date/Time   CHOL 150 10/12/2017 1000   TRIG 74 10/12/2017 1000   HDL 51 10/12/2017 1000   LDLCALC 84 10/12/2017 1000   Hepatic Function Panel     Component Value Date/Time   PROT 6.7 10/12/2017 1000   ALBUMIN 4.5 10/12/2017 1000   AST 21 10/12/2017 1000   ALT 29 10/12/2017 1000   ALKPHOS 74 10/12/2017 1000   BILITOT 1.1 10/12/2017 1000      Component Value Date/Time   TSH 2.070 05/25/2017 1147   Results for Lindsey RuddleHORNSBY, Lindsey [name] (MRN [id number]) as of 11/09/2017 16:30  Ref. Range 10/12/2017 10:00  Vitamin D, 25-Hydroxy Latest Ref Range: 30.0 - 100.0 ng/mL 42.1   ASSESSMENT AND PLAN: Vitamin D deficiency - Plan: Vitamin D, Ergocalciferol, (DRISDOL) 50000 units CAPS capsule  Prediabetes  At risk for diabetes mellitus  Class 3 severe obesity with serious comorbidity and body mass index (BMI) of 40.0 to 44.9 in adult, unspecified obesity type (HCC)  PLAN:  Vitamin D Deficiency Lindsey [name] that low vitamin D levels contributes to fatigue and are associated with obesity, breast, and colon cancer. She agrees to continue to take prescription Vit D @50 ,000 IU every week #4 with no refills and will follow up for routine testing of vitamin D, at least 2-3  times per year. She was informed of the risk of over-replacement of vitamin D and agrees to not increase her dose unless she discusses this with Lindsey [name] first. Lindsey [name] agrees to follow up as directed.  Pre-Diabetes Lindsey [name] will continue to work on weight loss, exercise, and decreasing simple carbohydrates in her diet to help decrease the risk of diabetes. She was informed that eating too many simple carbohydrates or too many calories at one sitting increases the likelihood of GI side effects.  Lindsey [name] agreed to follow up with [place] as directed to monitor her progress.  Diabetes risk counseling Lindsey [name] was given extended (15 minutes) diabetes prevention counseling today. She is 62 y.o. female and has risk factors for diabetes including obesity and prediabetes. We discussed intensive lifestyle modifications today with an emphasis on weight loss as well as increasing exercise and decreasing simple carbohydrates in her diet.  Obesity Lindsey [name] is currently in the action stage of change. As such, her goal is to continue with weight loss efforts She has agreed to keep a food journal with 1500 to 1800 calories and 100+ grams of protein daily Lindsey [name] has been instructed to work up to a goal of 150 minutes of combined cardio and strengthening exercise per week for weight loss and overall health benefits. We discussed the following Behavioral Modification Strategies today: increasing lean protein intake, decreasing simple carbohydrates  and work on meal planning and easy cooking plans  Lindsey [name] has agreed to follow up with our clinic in 4 weeks. She was informed of the importance of frequent follow up visits to maximize her success with intensive lifestyle modifications for her multiple health conditions.   OBESITY BEHAVIORAL INTERVENTION VISIT  Today's visit was # 7   Starting weight: 325 lbs Starting date: 05/25/17 Today's weight : 268 lbs Today's date: 11/09/2017 Total lbs lost to date: 57 At least 15 minutes were spent on  discussing the following behavioral intervention visit.   ASK: We discussed the diagnosis of obesity with Lindsey [name] today and Lindsey [name] agreed to give [place] permission to discuss obesity behavioral modification therapy today.  ASSESS: Lindsey [name] has the diagnosis of obesity and her BMI today is 43.28 Lindsey [name] is in the action stage of change   ADVISE: Lindsey [name] was educated on the multiple health risks of obesity as well as the benefit of weight loss to improve her health. She was advised of the need for long term treatment and the importance of lifestyle modifications to improve her current health and to decrease her risk of future health problems.  AGREE: Multiple dietary modification options and treatment options were discussed and  Lindsey [name] agreed to follow the recommendations documented in the above note.  ARRANGE: Lindsey [name] was educated on the importance of frequent visits to treat obesity as outlined per CMS and USPSTF guidelines and agreed to schedule her next follow up appointment today.  I, Lindsey [name], am acting as transcriptionist for Lindsey Quince, MD  I have reviewed the above documentation for accuracy and completeness, and I agree with the above. -Lindsey Quince, MD

## 2017-11-15 NOTE — Progress Notes (Signed)
Office: [phone]  /  Fax: [phone]   Date: November 16, 2017   Time Seen: 10:30am Duration: 35 minutes Provider: Lawerance CruelGaytri Barker, Psy.D. Type of Session: Individual Therapy   HPI: Erskine SquibbJane was referred by [name] due to emotional eating . Per the note for the last visit with [name] on October 12, 2017, "Barbee CoughJaneis frustrated that she is still giving into temptations frequently, especially in social situations." During the initial appointment with this provider on November 02, 2017, Erskine SquibbJane shared she does not want to know about her weight loss at this time, as she has a history of "being obsessive" about the number. Erskine SquibbJane expressed concern about "falling into the same pattern." She described feeling like she is "waiting for the other shoe to drop." She noted, "I let failure depress me." Erskine SquibbJane was also asked to complete a questionnaire assessing various behaviors related to emotional eating. Erskine SquibbJane endorsed the following: overeat when you are celebrating; experience food cravings on a regular basis; eat certain foods when you are anxious, stressed, depressed, or your feelings are hurt; use food to help you cope with emotional situations; find food is comforting to you; overeat frequently when you are bored or lonely; not worry about what you eat when you are in a good mood; overeat when you are angry at someone just to show them they cannot control you; overeat when you are alone, but eat much less when you are with other people; eat to help you stay awake; and eat as a reward. Moreover, per the note for the initial visit with [name] on May 25, 2017, shestarted gaining weight 62 years old. She reported experiencing the following: significant food cravings issues; snacking frequently in the evenings; waking up frquently in the middle of the night to eat; skipping meals frequently; frequently drinking liquids with calories; frequently making poor food choices; problems with excessive hunger;  frequently eating larger portions than normal; binge eating behaviors; and struggling with emotional eating. Furthermore, during the initial appointment with this provider, Erskine SquibbJane shared she came from a family where her parents felt they were overweight, which was "always an issue for them." Growing up, she felt "deprived" of certain foods.   Session Content: Session focused on the following treatment goal: decrease emotional eating. The session was initiated with the administration of the PHQ-9 and GAD-7, as well as a brief check-in. Erskine SquibbJane expressed disappointment related to her weight loss during the last visit as she anticipated she would have lost more weight. Session briefly focused on the handout provided distinguishing physical versus emotional hunger. Erskine SquibbJane reported, "I did not really have a emotional hunger." She also reported she feels there are two additional hunger categories: social and hormonal. Psychoeducation regarding triggers for emotional eating was provided, which included the social aspect that Erskine SquibbJane noted. Erskine SquibbJane was provided a handout, and encouraged to utilize the handout between now and the next appointment to increase awareness of triggers and frequency. Erskine SquibbJane agreed. Moreover, Erskine SquibbJane discussed a desire to "minimize" the frequency of appointments. Thus, this provider explored this desire. Erskine SquibbJane shared she is going on vacation at the end of September and would prefer to have an appointment after she returns to help her stay "on track."  While this provider initially agreed to this, Erskine SquibbJane discussed further concerns related to her success with making healthy choices while on vacation. She also acknowledged that interacting with her mother is a trigger for her. As such, this provider recommended that an appointment be scheduled between now  and her vacation. Darlynn was receptive to this.    Eniya was receptive to today's session as evidenced by sharing about recent events, discussing concerns related to  her upcoming vacation, and her willingness to schedule an appointment with this provider prior to her vacation.  Mental Status Examination: Shavone arrived on time for the appointment. She presented as appropriately dressed and groomed. Alberta appeared her stated age and demonstrated adequate orientation to time, place, person, and purpose of the appointment. She also demonstrated appropriate eye contact. No psychomotor abnormalities or behavioral peculiarities noted. Her mood was euthymic with congruent affect. Her thought processes were logical, linear, and goal-directed. No hallucinations, delusions, bizarre thinking or behavior reported or observed. Judgment, insight, and impulse control appeared to be grossly intact. There was no evidence of paraphasias (i.e., errors in speech, gross mispronunciations, and word substitutions), repetition deficits, or disturbances in volume or prosody (i.e., rhythm and intonation). There was no evidence of attention or memory impairments. Iyanna denied current suicidal and homicidal ideation, intent or plan.  Structured Assessment Results: The Patient Health Questionnaire-9 (PHQ-9) is a self-report measure that assesses symptoms and severity of depression over the course of the last two weeks. Quinlynn obtained a score of two suggesting minimal depression. Neelah finds the endorsed symptoms to be somewhat difficult. Depression screen [facility] 2/9 11/16/2017  Decreased Interest 0  Down, Depressed, Hopeless 0  PHQ - 2 Score 0  Altered sleeping 0  Tired, decreased energy 1  Change in appetite 0  Feeling bad or failure about yourself  0  Trouble concentrating 0  Moving slowly or fidgety/restless 1  Suicidal thoughts 0  PHQ-9 Score 2  Difficult doing work/chores -   The Generalized Anxiety Disorder-7 (GAD-7) is a brief self-report measure that assesses symptoms of anxiety over the course of the last two weeks. Rickie obtained a score of three suggesting minimal anxiety. GAD 7 :  Generalized Anxiety Score 11/16/2017  Nervous, Anxious, on Edge 1  Control/stop worrying 1  Worry too much - different things 1  Trouble relaxing 0  Restless 0  Easily annoyed or irritable 0  Afraid - awful might happen 0  Total GAD 7 Score 3  Anxiety Difficulty Somewhat difficult   Interventions: Louan was administered the PHQ-9 and GAD-7 for symptom monitoring. Content from the last session was reviewed. Throughout today's session, empathic reflections and validation were provided. Psychoeducation regarding triggers for emotional eating was provided and Shauna was given a handout.   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 The next appointment will be scheduled in two weeks. The next session will focus on reviewing learned skills, and working towards the established treatment goal.

## 2017-11-16 ENCOUNTER — Ambulatory Visit (INDEPENDENT_AMBULATORY_CARE_PROVIDER_SITE_OTHER): Payer: BLUE CROSS/BLUE SHIELD | Admitting: Psychology

## 2017-11-16 DIAGNOSIS — F3289 Other specified depressive episodes: Secondary | ICD-10-CM | POA: Diagnosis not present

## 2017-11-16 DIAGNOSIS — F418 Other specified anxiety disorders: Secondary | ICD-10-CM

## 2017-11-30 ENCOUNTER — Ambulatory Visit (INDEPENDENT_AMBULATORY_CARE_PROVIDER_SITE_OTHER): Payer: BLUE CROSS/BLUE SHIELD | Admitting: Psychology

## 2017-11-30 DIAGNOSIS — F418 Other specified anxiety disorders: Secondary | ICD-10-CM | POA: Diagnosis not present

## 2017-11-30 NOTE — Progress Notes (Signed)
Office: [phone]  /  Fax: [phone]   Date: November 30, 2017   Time Seen: 4:00pm  Duration: 35 minutes Provider: Lawerance Cruel, Psy.D. Type of Session: Individual Therapy   HPI: Lindsey [name] was referred by Dr. Orlene Plum due to emotional eating and was seen for an initial appointment with this provider on November 02, 2017. Per the note for the last visit with Dr. Dalbert Garnet on October 12, 2017, "Lindsey [name] is frustrated that she is still giving into temptations frequently, especially in social situations." During the initial appointment with this provider on November 02, 2017, Lindsey [name] shared she does not want to know about her weight loss at this time, as she has a history of "being obsessive" about the number. Lindsey [name] expressed concern about "falling into the same pattern." She described feeling like she is "waiting for the other shoe to drop." She noted, "I let failure depress me." Lindsey [name] was also asked to complete a questionnaire assessing various behaviors related to emotional eating. Lindsey [name] endorsed the following: overeat when you are celebrating; experience food cravings on a regular basis; eat certain foods when you are anxious, stressed, depressed, or your feelings are hurt; use food to help you cope with emotional situations; find food is comforting to you; overeat frequently when you are bored or lonely; not worry about what you eat when you are in a good mood; overeat when you are angry at someone just to show them they cannot control you; overeat when you are alone, but eat much less when you are with other people; eat to help you stay awake; and eat as a reward. Moreover, per the note for the initial visit with Dr. Dalbert Garnet on May 25, 2017, she started gaining weight 62 years old. She reported experiencing the following: significant food cravings issues; snacking frequently in the evenings; waking up frquently in the middle of the night to eat; skipping meals frequently; frequently drinking liquids with  calories; frequently making poor food choices; problems with excessive hunger; frequently eating larger portions than normal; binge eating behaviors; and struggling with emotional eating. Furthermore, during the initial appointment with this provider, Lindsey [name] shared she came from a family where her parents felt they were overweight, which was "always an issue for them." Growing up, she felt "deprived" of certain foods. During today's appointment, Lindsey [name] discussed difficulties during the recent holiday relates to eating and continuing physical activity.  Session Content: Session focused on the following treatment goal: decrease emotional eating. The session was initiated with the administration of the PHQ-9 and GAD-7, as well as a brief check-in. Information concerning the practice, financial arrangements, and confidentiality and patients' rights were discussed during the initial appointment; however, per this provider's new office policy, Lindsey [name] was asked to sign a service agreement related to the aforementioned. The agreement was reviewed with, and signed by, Lindsey [name]. Notably, Lindsey [name] did not want to include an emergency contact on the service agreement. Since the last appointment, Lindsey [name] shared she "wound up eating more calories" during the Labor Day weekend during which she spent time with her mother. She also shared she has not resumed walking since the holiday.  Regarding her mother, Lindsey [name] shared she will not be present during her upcoming vacation. She described experiencing relief of her mother reportedly makes her "furious." Lindsey [name] further reported, "I do not want people to observe me" and explained her mother often makes comments about her weight.  She also discussed worry related to her ability to continue making success with this clinic. In addition,  Lindsey [name] shared, "I am becoming focused on goals." She described the aforementioned has further contributing to her overall worry. As such, the remainder of session focused on  thought defusion. Psychoeducation regarding thought defusion as a coping skill was provided and Lindsey [name] was led through an exercise. She was given a handout with various exercises to practice between now and the next appointment. Overall, Lindsey [name] was receptive to this session as evidenced by her openness to sharing and responsiveness to feedback. She also expressed willingness to practice thought defusion.   Mental Status Examination: Lindsey [name] arrived on time for the appointment. She presented as appropriately dressed and groomed. Lindsey [name] appeared her stated age and demonstrated adequate orientation to time, place, person, and purpose of the appointment. She also demonstrated appropriate eye contact. No psychomotor abnormalities or behavioral peculiarities noted. Her mood was euthymic with congruent affect. Her thought processes were logical, linear, and goal-directed. No hallucinations, delusions, bizarre thinking or behavior reported or observed. Judgment, insight, and impulse control appeared to be grossly intact. There was no evidence of paraphasias (i.e., errors in speech, gross mispronunciations, and word substitutions), repetition deficits, or disturbances in volume or prosody (i.e., rhythm and intonation). There was no evidence of attention or memory impairments. Lindsey [name] denied current suicidal and homicidal ideation, intent or plan.  Structured Assessment Results: The Patient Health Questionnaire-9 (PHQ-9) is a self-report measure that assesses symptoms and severity of depression over the course of the last two weeks. Lindsey [name] obtained a score of four suggesting minimal depression. Anzley finds the endorsed symptoms to be not difficult at all. Depression screen PHQ 2/9 11/30/2017  Decreased Interest 1  Down, Depressed, Hopeless 1  PHQ - 2 Score 2  Altered sleeping 1  Tired, decreased energy 0  Change in appetite 0  Feeling bad or failure about yourself  1  Trouble concentrating 0  Moving slowly or fidgety/restless 0   Suicidal thoughts 0  PHQ-9 Score 4  Difficult doing work/chores -   The Generalized Anxiety Disorder-7 (GAD-7) is a brief self-report measure that assesses symptoms of anxiety over the course of the last two weeks. Lindsey [name] obtained a score of three suggesting minimal anxiety. GAD 7 : Generalized Anxiety Score 11/30/2017  Nervous, Anxious, on Edge 0  Control/stop worrying 0  Worry too much - different things 1  Trouble relaxing 0  Restless 0  Easily annoyed or irritable 1  Afraid - awful might happen 1  Total GAD 7 Score 3  Anxiety Difficulty Not difficult at all   Interventions: Lindsey [name] was administered the PHQ-9 and GAD-7 for symptom monitoring. Content from the last session was reviewed. Throughout today's session, empathic reflections and validation were provided. Psychoeducation regarding thought defusion was provided and Lindsey [name] was led through an exercise.  DSM-5 Diagnosis: 300.09 (F41.8) Other Specified Anxiety Disorder, Generalized anxiety not occurring more days than not and emotional eating behaviors  Treatment Goal & Progress: Lindsey [name] was seen for an initial appointment with this provider on November 02, 2017 during which the following treatment goal was established: decrease emotional eating. Lindsey [name] has demonstrated progress in her goal as evidenced by her increased awareness in hunger patterns as well as triggers. During today's appointment, she was introduced to thought defusion as a coping skill which can help reduce emotional eating a self soothing of emotions with food can be a trigger for emotional eating.  Plan: Lindsey [name] continues to appear able and willing to participate as evidenced by engagement in reciprocal conversation, and asking questions for clarification as appropriate. The next  appointment will be scheduled in one month per Lindsey [name]'s request. She indicated she would call prior to that if needed. The next session will focus on reviewing thought defusion, and working towards the  established treatment goal.

## 2017-12-07 ENCOUNTER — Ambulatory Visit (INDEPENDENT_AMBULATORY_CARE_PROVIDER_SITE_OTHER): Payer: Self-pay | Admitting: Family Medicine

## 2017-12-08 ENCOUNTER — Ambulatory Visit (INDEPENDENT_AMBULATORY_CARE_PROVIDER_SITE_OTHER): Payer: Self-pay | Admitting: Family Medicine

## 2017-12-08 ENCOUNTER — Ambulatory Visit (INDEPENDENT_AMBULATORY_CARE_PROVIDER_SITE_OTHER): Payer: BLUE CROSS/BLUE SHIELD | Admitting: Family Medicine

## 2017-12-08 VITALS — BP 139/77 | HR 72 | Temp 97.7°F | Ht 66.0 in | Wt 262.0 lb

## 2017-12-08 DIAGNOSIS — E559 Vitamin D deficiency, unspecified: Secondary | ICD-10-CM

## 2017-12-08 DIAGNOSIS — R7303 Prediabetes: Secondary | ICD-10-CM

## 2017-12-08 DIAGNOSIS — Z6841 Body Mass Index (BMI) 40.0 and over, adult: Secondary | ICD-10-CM

## 2017-12-12 NOTE — Progress Notes (Signed)
Office: [phone]  /  Fax: [phone]   HPI:   Chief Complaint: OBESITY Lindsey [name] is here to discuss her progress with her obesity treatment plan. She is on the keep a food journal with 1500 to 1800 calories and 100+ grams of protein daily and is following her eating plan approximately 70 % of the time. She states she is exercising 0 minutes 0 times per week. Lindsey [name] says it's tempting to eat up to 1800 calories. She is hitting her protein goal almost daily. Lindsey [name] is traveling to [place] soon and she is concerned about her food quantity and content. Her weight is 262 lb (118.8 kg) today and has had a weight loss of 6 pounds over a period of 4 weeks since her last visit. She has lost 63 lbs since starting treatment with [place].  Vitamin D deficiency Lindsey [name] has a diagnosis of vitamin D deficiency. Lindsey [name] is currently taking vit D and she admits to fatigue, but she denies nausea, vomiting or muscle weakness.  Pre-Diabetes Lindsey [name] has a diagnosis of prediabetes based on her elevated Hgb A1c and was informed this puts her at greater risk of developing diabetes. Her Hgb A1c and insulin improved on  Her second lab draw. She is not on medications currently and continues to work on diet and exercise to decrease risk of diabetes. She denies any cravings.  ALLERGIES: Allergies  Allergen Reactions  . Lactose Intolerance (Gi) Other (See Comments)    Cramping  . Sulfa Antibiotics   . Amoxicillin Rash    MEDICATIONS: Current Outpatient Medications on File Prior to Visit  Medication Sig Dispense Refill  . Vitamin D, Ergocalciferol, (DRISDOL) 50000 units CAPS capsule Take 1 capsule (50,000 Units total) by mouth every 7 (seven) days. 8 capsule 0   No current facility-administered medications on file prior to visit.     PAST MEDICAL HISTORY: Past Medical History:  Diagnosis Date  . Dry skin   . H/O seasonal allergies   . Hair loss   . Knee pain   . Multiple stiff joints   . Nasal congestion   . Skin tag     . Snoring   . Urgency of urination     PAST SURGICAL HISTORY: Past Surgical History:  Procedure Laterality Date  . FOOT SURGERY    . TONSILLECTOMY      SOCIAL HISTORY: Social History   Tobacco Use  . Smoking status: Former Smoker    Packs/day: 1.00    Years: 15.00    Pack years: 15.00    Types: Cigarettes  . Smokeless tobacco: Never Used  . Tobacco comment: quit 10 years ago  Substance Use Topics  . Alcohol use: Yes    Comment: 1-2 drinks a month or less  . Drug use: No    FAMILY HISTORY: Family History  Problem Relation Age of Onset  . High blood pressure Mother   . Depression Mother   . Obesity Mother   . Diabetes Father   . Heart disease Father   . Cancer Father   . Sleep apnea Father   . Obesity Father     ROS: Review of Systems  Constitutional: Positive for malaise/fatigue and weight loss.  Gastrointestinal: Negative for nausea and vomiting.  Musculoskeletal:       Negative for muscle weakness  Endo/Heme/Allergies:       Negative for cravings    PHYSICAL EXAM: Blood pressure 139/77, pulse 72, temperature 97.7 F (36.5 C), temperature source Oral, height 5\' 6"  (1.676 m),  weight 262 lb (118.8 kg), SpO2 95 %. Body mass index is 42.29 kg/m. Physical Exam  Constitutional: She is oriented to person, place, and time. She appears well-developed and well-nourished.  Cardiovascular: Normal rate.  Pulmonary/Chest: Effort normal.  Musculoskeletal: Normal range of motion.  Neurological: She is oriented to person, place, and time.  Skin: Skin is warm and dry.  Psychiatric: She has a normal mood and affect. Her behavior is normal.  Vitals reviewed.   RECENT LABS AND TESTS: BMET    Component Value Date/Time   NA 143 10/12/2017 1000   K 4.3 10/12/2017 1000   CL 100 10/12/2017 1000   CO2 25 10/12/2017 1000   GLUCOSE 92 10/12/2017 1000   BUN 14 10/12/2017 1000   CREATININE 0.71 10/12/2017 1000   CALCIUM 9.6 10/12/2017 1000   GFRNONAA 92 10/12/2017  1000   GFRAA 106 10/12/2017 1000   Lab Results  Component Value Date   HGBA1C 5.5 10/12/2017   HGBA1C 5.7 (H) 05/25/2017   Lab Results  Component Value Date   INSULIN 19.1 10/12/2017   INSULIN 44.2 (H) 05/25/2017   CBC    Component Value Date/Time   WBC 6.0 05/25/2017 1147   RBC 4.46 05/25/2017 1147   HGB 13.6 05/25/2017 1147   HCT 40.7 05/25/2017 1147   MCV 91 05/25/2017 1147   MCH 30.5 05/25/2017 1147   MCHC 33.4 05/25/2017 1147   RDW 13.9 05/25/2017 1147   LYMPHSABS 1.8 05/25/2017 1147   EOSABS 0.2 05/25/2017 1147   BASOSABS 0.0 05/25/2017 1147   Iron/TIBC/Ferritin/ %Sat No results found for: IRON, TIBC, FERRITIN, IRONPCTSAT Lipid Panel     Component Value Date/Time   CHOL 150 10/12/2017 1000   TRIG 74 10/12/2017 1000   HDL 51 10/12/2017 1000   LDLCALC 84 10/12/2017 1000   Hepatic Function Panel     Component Value Date/Time   PROT 6.7 10/12/2017 1000   ALBUMIN 4.5 10/12/2017 1000   AST 21 10/12/2017 1000   ALT 29 10/12/2017 1000   ALKPHOS 74 10/12/2017 1000   BILITOT 1.1 10/12/2017 1000      Component Value Date/Time   TSH 2.070 05/25/2017 1147   Results for Lindsey RuddleHORNSBY, Allee E (MRN [id number]) as of 12/12/2017 11:58  Ref. Range 10/12/2017 10:00  Vitamin D, 25-Hydroxy Latest Ref Range: 30.0 - 100.0 ng/mL 42.1   ASSESSMENT AND PLAN: Vitamin D deficiency  Prediabetes  Class 3 severe obesity with serious comorbidity and body mass index (BMI) of 40.0 to 44.9 in adult, unspecified obesity type (HCC)  PLAN:  Vitamin D Deficiency Erskine SquibbJane was informed that low vitamin D levels contributes to fatigue and are associated with obesity, breast, and colon cancer. She agrees to continue to take prescription Vit D @50 ,000 IU every week (no refill needed) and will follow up for routine testing of vitamin D, at least 2-3 times per year. She was informed of the risk of over-replacement of vitamin D and agrees to not increase her dose unless she discusses this with us  first.  Pre-Diabetes Erskine SquibbJane will continue to work on weight loss, exercise, and decreasing simple carbohydrates in her diet to help decrease the risk of diabetes. She was informed that eating too many simple carbohydrates or too many calories at one sitting increases the likelihood of GI side effects. Erskine SquibbJane agreed to follow up with us as directed to monitor her progress.  I spent > than 50% of the 15 minute visit on counseling as documented in the note.  Obesity  Venna is currently in the action stage of change. As such, her goal is to continue with weight loss efforts She has agreed to keep a food journal with 1500 to 1800 calories and 100+ grams of protein daily Freya has been instructed to work up to a goal of 150 minutes of combined cardio and strengthening exercise per week for weight loss and overall health benefits. We discussed the following Behavioral Modification Strategies today: better snacking choices, planning for success, keep a strict food journal, increasing lean protein intake and work on meal planning and easy cooking plans  Amiee has agreed to follow up with our clinic in 2 weeks. She was informed of the importance of frequent follow up visits to maximize her success with intensive lifestyle modifications for her multiple health conditions.   OBESITY BEHAVIORAL INTERVENTION VISIT  Today's visit was # 8   Starting weight: 325 lbs Starting date: 05/25/17 Today's weight : 262 lbs  Today's date: 12/08/2017 Total lbs lost to date: 45   ASK: We discussed the diagnosis of obesity with Lindsey [name] today and Lindsey [name] agreed to give [place] permission to discuss obesity behavioral modification therapy today.  ASSESS: Lindsey [name] has the diagnosis of obesity and her BMI today is 42.31 Lindsey [name] is in the action stage of change   ADVISE: Lindsey [name] was educated on the multiple health risks of obesity as well as the benefit of weight loss to improve her health. She was advised of the need for long term  treatment and the importance of lifestyle modifications to improve her current health and to decrease her risk of future health problems.  AGREE: Multiple dietary modification options and treatment options were discussed and  Lindsey [name] agreed to follow the recommendations documented in the above note.  ARRANGE: Lindsey [name] was educated on the importance of frequent visits to treat obesity as outlined per CMS and USPSTF guidelines and agreed to schedule her next follow up appointment today.  I, Nevada Crane, am acting as transcriptionist for Filbert Schilder, MD  I have reviewed the above documentation for accuracy and completeness, and I agree with the above. - Debbra Riding, MD

## 2017-12-21 NOTE — Progress Notes (Signed)
Office: [phone]  /  Fax: [phone]   Date: December 28, 2017 Time Seen: 4:00pm Duration: 30 minutes Provider: Lawerance [name], Lindsey [name]. Type of Session: Individual Therapy   HPI: Lindsey [name] was referred by Dr. Orlene Plum due to emotional eating and was seen for an initial appointment with this provider on November 02, 2017. Per the note for the last visit with Dr. Dalbert Garnet on October 12, 2017, "Lindsey [name] frustrated that she is still giving into temptations frequently, especially in social situations."During the initial appointment with this provider on November 02, 2017,Lindsey [name] shared she does not want to know about her weight loss at this time, as she has a history of "being obsessive" about the number. Lindsey [name] expressed concern about "falling into the same pattern." She described feeling like she is "waiting for the other shoe to drop." She noted, "I let failure depress me."[name]:overeat when you are celebrating; experience food cravings on a regular basis; eat certain foods when you are anxious, stressed, depressed, or your feelings are hurt; use food to help you cope with emotional situations; find food is comforting to you; overeat frequently when you are bored or lonely; not worry about what you eat when you are in a good mood; overeat when you are angry at someone just to show them they cannot control you; overeat when you are alone, but eat much less when you are with other people; eat to help you stay awake; and eat as a reward. Moreover, per the note for the initial visit with Dr.Beasley on May 25, 2017,shestarted gaining weight 62 years old. She reported experiencing the following:significant food cravings issues; snackingfrequently in the evenings; wakingup frquently in the middle of the night to eat; skippingmeals frequently; frequently drinking liquids with  calories;frequentlymakingpoor food choices;problems with excessive hunger;frequentlyeatinglarger portions than normal;binge eating behaviors; andstrugglingwith emotional eating.Furthermore, during the initial appointment with this provider,Lindsey [name] shared she came from a family where her parents felt they were overweight, which was "always an issue for them." Growing up, she felt "deprived" of certain foods.During today's appointment, Lindsey [name] described things went better than anticipated on her vacation.   Session Content: Session focused on the following treatment goal: decrease emotional eating. The session was initiated with the administration of the PHQ-9 and GAD-7, as well as a brief check-in. Lindsey [name] reported the vacation "went very well" and "easier" than she anticipated. Since her appointment, Lindsey [name] indicated feeling "antsy" and noted she was not hungry in the usual way, but felt she really needed to eat. She explained that in the past when that has happened, she reported she "pigged out." Moreover, Lindsey [name] indicated a belief that it could be related to low blood sugar and she described feeling better after eating a club sandwich and french fries. Thus, it was recommended she inform Dr. Dalbert Garnet at their appointment next week. She agreed. Despite the aforementioned, [name] stated she resumed working toward her calorie and protein goals. Regarding thought defusion, Lindsey [name] noted she practiced it briefly, "but did not get really far with it." She noted, "I enjoy thoughts racing around because that's how I entertain myself." An additional thought defusion exercise was provided and it was explained to Lindsey [name] that this particular exercise focused on all thoughts, both positive and negative. Overall Lindsey [name] was receptive to today's session as evidenced by her openness to sharing a willingness to try the new thought defusion exercise. However, she indicated a desire to terminate services at this  time as she feels she no  longer needs therapeutic services.  Mental Status Examination: Lindsey [name] arrived on time for the appointment. She presented as appropriately dressed and groomed. Lindsey [name] appeared her stated age and demonstrated adequate orientation to time, place, person, and purpose of the appointment. She also demonstrated appropriate eye contact. No psychomotor abnormalities or behavioral peculiarities noted. Her mood was euthymic with congruent affect. Her thought processes were logical, linear, and goal-directed. No hallucinations, delusions, bizarre thinking or behavior reported or observed. Judgment, insight, and impulse control appeared to be grossly intact. There was no evidence of paraphasias (i.e., errors in speech, gross mispronunciations, and word substitutions), repetition deficits, or disturbances in volume or prosody (i.e., rhythm and intonation). There was no evidence of attention or memory impairments. Lindsey [name] denied current suicidal and homicidal ideation, intent or plan.  Structured Assessment Results: The Patient Health Questionnaire-9 (PHQ-9) is a self-report measure that assesses symptoms and severity of depression over the course of the last two weeks. Lindsey [name] obtained a score of three suggesting minimal depression. Lindsey [name] finds the endorsed symptoms to be not difficult at all. Depression screen PHQ 2/9 12/28/2017  Decreased Interest 0  Down, Depressed, Hopeless 1  PHQ - 2 Score 1  Altered sleeping 0  Tired, decreased energy 0  Change in appetite 1  Feeling bad or failure about yourself  1  Trouble concentrating 0  Moving slowly or fidgety/restless 0  Suicidal thoughts 0  PHQ-9 Score 3  Difficult doing work/chores -   The Generalized Anxiety Disorder-7 (GAD-7) is a brief self-report measure that assesses symptoms of anxiety over the course of the last two weeks. Lindsey [name] obtained a score of two suggesting minimal anxiety. GAD 7 : Generalized Anxiety Score 12/28/2017  Nervous, Anxious, on Edge 0  Control/stop  worrying 0  Worry too much - different things 1  Trouble relaxing 0  Restless 0  Easily annoyed or irritable 0  Afraid - awful might happen 1  Total GAD 7 Score 2  Anxiety Difficulty Not difficult at all   Interventions: Lindsey [name] was administered the PHQ-9 and GAD-7 for symptom monitoring. Content from the last session was reviewed. Throughout today's session, empathic reflections and validation were provided. An additional thought defusion exercise was discussed and provided.   DSM-5 Diagnosis: 300.09 (F41.8) Other Specified Anxiety Disorder, Generalized anxiety not occurring more days than not and emotional eating behaviors  Treatment Goal & Progress: Lindsey [name] was seen for an initial appointment with this provider on November 02, 2017 during which the following treatment goal was established: decrease emotional eating. Lindsey [name] has demonstrated progress in her goal of decreasing emotional eating as evidenced by increased awareness and hunger patterns and triggers for emotional eating. She was also introduced to thought defusion  Plan: At this time, Lindsey [name] declined future appointments she explained, "I don't think it is necessary right now." She noted that should anything change, she would re-initiate services with this provider.

## 2017-12-28 ENCOUNTER — Ambulatory Visit (INDEPENDENT_AMBULATORY_CARE_PROVIDER_SITE_OTHER): Payer: BLUE CROSS/BLUE SHIELD | Admitting: Psychology

## 2017-12-28 DIAGNOSIS — F418 Other specified anxiety disorders: Secondary | ICD-10-CM

## 2018-01-04 ENCOUNTER — Ambulatory Visit (INDEPENDENT_AMBULATORY_CARE_PROVIDER_SITE_OTHER): Payer: BLUE CROSS/BLUE SHIELD | Admitting: Family Medicine

## 2018-01-04 VITALS — HR 70 | Temp 98.0°F | Ht 66.0 in | Wt 255.0 lb

## 2018-01-04 DIAGNOSIS — E559 Vitamin D deficiency, unspecified: Secondary | ICD-10-CM | POA: Diagnosis not present

## 2018-01-04 DIAGNOSIS — Z6841 Body Mass Index (BMI) 40.0 and over, adult: Secondary | ICD-10-CM

## 2018-01-04 DIAGNOSIS — Z9189 Other specified personal risk factors, not elsewhere classified: Secondary | ICD-10-CM

## 2018-01-04 DIAGNOSIS — R7303 Prediabetes: Secondary | ICD-10-CM | POA: Diagnosis not present

## 2018-01-04 MED ORDER — VITAMIN D (ERGOCALCIFEROL) 1.25 MG (50000 UNIT) PO CAPS: 50000.0000 [IU] | ORAL_CAPSULE | ORAL | 0 refills | Status: DC

## 2018-01-05 NOTE — Progress Notes (Signed)
Office: [phone]  /  Fax: [phone]   HPI:   Chief Complaint: OBESITY Lindsey [name] is here to discuss her progress with her obesity treatment plan. She is on the keep a food journal with 1500-1800 calories and 100+ grams of protein daily and is following her eating plan approximately 85 % of the time. She states she is exercising 0 minutes 0 times per week. Lindsey [name] continues to do well with weight loss. She is journaling most of the time. She was mindful about her food choices while on vacation.  Her weight is 255 lb (115.7 kg) today and has had a weight loss of 7 pounds over a period of 4 weeks since her last visit. She has lost 70 lbs since starting treatment with [place].  Vitamin D Deficiency Lindsey [name] has a diagnosis of vitamin D deficiency. She is stable on prescription Vit D and denies nausea, vomiting or muscle weakness.  Pre-Diabetes Lindsey [name] has a diagnosis of prediabetes based on her elevated Hgb A1c and was informed this puts her at greater risk of developing diabetes. She is not taking metformin currently. She is stable on diet, but she had 2 episodes of hypoglycemia and resulting polyphagia but this has greatly improved since weight loss.  At risk for diabetes Lindsey [name] is at higher than average risk for developing diabetes due to her obesity and pre-diabetes. She currently denies polyuria or polydipsia.  ALLERGIES: Allergies  Allergen Reactions  . Lactose Intolerance (Gi) Other (See Comments)    Cramping  . Sulfa Antibiotics   . Amoxicillin Rash    MEDICATIONS: Current Outpatient Medications on File Prior to Visit  Medication Sig Dispense Refill  . Multiple Vitamins-Minerals (ICAPS AREDS 2 PO) Take 1 capsule by mouth 2 (two) times daily.    . Multiple Vitamins-Minerals (WOMENS MULTIVITAMIN PLUS PO) Take 1 tablet by mouth daily.     No current facility-administered medications on file prior to visit.     PAST MEDICAL HISTORY: Past Medical History:  Diagnosis Date  . Dry skin   .  H/O seasonal allergies   . Hair loss   . Knee pain   . Multiple stiff joints   . Nasal congestion   . Skin tag   . Snoring   . Urgency of urination     PAST SURGICAL HISTORY: Past Surgical History:  Procedure Laterality Date  . FOOT SURGERY    . TONSILLECTOMY      SOCIAL HISTORY: Social History   Tobacco Use  . Smoking status: Former Smoker    Packs/day: 1.00    Years: 15.00    Pack years: 15.00    Types: Cigarettes  . Smokeless tobacco: Never Used  . Tobacco comment: quit 10 years ago  Substance Use Topics  . Alcohol use: Yes    Comment: 1-2 drinks a month or less  . Drug use: No    FAMILY HISTORY: Family History  Problem Relation Age of Onset  . High blood pressure Mother   . Depression Mother   . Obesity Mother   . Diabetes Father   . Heart disease Father   . Cancer Father   . Sleep apnea Father   . Obesity Father     ROS: Review of Systems  Constitutional: Positive for weight loss.  Genitourinary: Negative for frequency.  Endo/Heme/Allergies: Negative for polydipsia.       Positive hypoglycemia Positive polyphagia    PHYSICAL EXAM: Pulse 70, temperature 98 F (36.7 C), temperature source Oral, height 5\' 6"  (1.676 m),  weight 255 lb (115.7 kg), SpO2 97 %. Body mass index is 41.16 kg/m. Physical Exam  Constitutional: She is oriented to person, place, and time. She appears well-developed and well-nourished.  Cardiovascular: Normal rate.  Pulmonary/Chest: Effort normal.  Musculoskeletal: Normal range of motion.  Neurological: She is oriented to person, place, and time.  Skin: Skin is warm and dry.  Psychiatric: She has a normal mood and affect. Her behavior is normal.  Vitals reviewed.   RECENT LABS AND TESTS: BMET    Component Value Date/Time   NA 143 10/12/2017 1000   K 4.3 10/12/2017 1000   CL 100 10/12/2017 1000   CO2 25 10/12/2017 1000   GLUCOSE 92 10/12/2017 1000   BUN 14 10/12/2017 1000   CREATININE 0.71 10/12/2017 1000    CALCIUM 9.6 10/12/2017 1000   GFRNONAA 92 10/12/2017 1000   GFRAA 106 10/12/2017 1000   Lab Results  Component Value Date   HGBA1C 5.5 10/12/2017   HGBA1C 5.7 (H) 05/25/2017   Lab Results  Component Value Date   INSULIN 19.1 10/12/2017   INSULIN 44.2 (H) 05/25/2017   CBC    Component Value Date/Time   WBC 6.0 05/25/2017 1147   RBC 4.46 05/25/2017 1147   HGB 13.6 05/25/2017 1147   HCT 40.7 05/25/2017 1147   MCV 91 05/25/2017 1147   MCH 30.5 05/25/2017 1147   MCHC 33.4 05/25/2017 1147   RDW 13.9 05/25/2017 1147   LYMPHSABS 1.8 05/25/2017 1147   EOSABS 0.2 05/25/2017 1147   BASOSABS 0.0 05/25/2017 1147   Iron/TIBC/Ferritin/ %Sat No results found for: IRON, TIBC, FERRITIN, IRONPCTSAT Lipid Panel     Component Value Date/Time   CHOL 150 10/12/2017 1000   TRIG 74 10/12/2017 1000   HDL 51 10/12/2017 1000   LDLCALC 84 10/12/2017 1000   Hepatic Function Panel     Component Value Date/Time   PROT 6.7 10/12/2017 1000   ALBUMIN 4.5 10/12/2017 1000   AST 21 10/12/2017 1000   ALT 29 10/12/2017 1000   ALKPHOS 74 10/12/2017 1000   BILITOT 1.1 10/12/2017 1000      Component Value Date/Time   TSH 2.070 05/25/2017 1147   Results for RIONA, LAHTI (MRN [id number]) as of 01/05/2018 15:55  Ref. Range 10/12/2017 10:00  Vitamin D, 25-Hydroxy Latest Ref Range: 30.0 - 100.0 ng/mL 42.1   ASSESSMENT AND PLAN: Vitamin D deficiency - Plan: Vitamin D, Ergocalciferol, (DRISDOL) 50000 units CAPS capsule  Prediabetes  At risk for diabetes mellitus  Class 3 severe obesity with serious comorbidity and body mass index (BMI) of 40.0 to 44.9 in adult, unspecified obesity type (HCC)  PLAN:  Vitamin D Deficiency Lindsey [name] that low vitamin D levels contributes to fatigue and are associated with obesity, breast, and colon cancer. Lindsey [name] agrees to continue taking prescription Vit D @50 ,000 IU every week #4 and we will refill for 1 month. She will follow up for routine testing of  vitamin D, at least 2-3 times per year. She was informed of the risk of over-replacement of vitamin D and agrees to not increase her dose unless she discusses this with [place] first. Lindsey [name] agrees to follow up with our clinic in 4 weeks.  Pre-Diabetes Lindsey [name] will continue to work on weight loss, diet, exercise, and work on decreasing simple carbohydrates in her diet to help decrease the risk of diabetes. We dicussed metformin including benefits and risks. She was informed that eating too many simple carbohydrates or too many calories at one  sitting increases the likelihood of GI side effects. Lindsey [name] declined metformin for now and a prescription was not written today. Lindsey [name] agrees to follow up with our clinic in 4 weeks as directed to monitor her progress.  Diabetes risk counselling Lindsey [name] was given extended (15 minutes) diabetes prevention counseling today. She is 62 y.o. female and has risk factors for diabetes including obesity and pre-diabetes. We discussed intensive lifestyle modifications today with an emphasis on weight loss as well as increasing exercise and decreasing simple carbohydrates in her diet.  Obesity Lindsey [name] is currently in the action stage of change. As such, her goal is to continue with weight loss efforts She has agreed to keep a food journal with 1500-1800 calories and 100+ grams of protein daily Gwyn has been instructed to work up to a goal of 150 minutes of combined cardio and strengthening exercise per week for weight loss and overall health benefits. We discussed the following Behavioral Modification Strategies today: increasing lean protein intake, decreasing simple carbohydrates  and holiday eating strategies    Lindsey [name] has agreed to follow up with our clinic in 4 weeks. She was informed of the importance of frequent follow up visits to maximize her success with intensive lifestyle modifications for her multiple health conditions.   OBESITY BEHAVIORAL INTERVENTION VISIT  Today's visit  was # 9   Starting weight: 325 lbs Starting date: 05/25/17 Today's weight : 255 lbs Today's date: 01/04/2018 Total lbs lost to date: 68    ASK: We discussed the diagnosis of obesity with Lindsey [name] today and Javanna agreed to give [place] permission to discuss obesity behavioral modification therapy today.  ASSESS: Lindsey [name] has the diagnosis of obesity and her BMI today is 47.18 Lindsey [name] is in the action stage of change   ADVISE: Lindsey [name] was educated on the multiple health risks of obesity as well as the benefit of weight loss to improve her health. She was advised of the need for long term treatment and the importance of lifestyle modifications to improve her current health and to decrease her risk of future health problems.  AGREE: Multiple dietary modification options and treatment options were discussed and  Lindsey [name] agreed to follow the recommendations documented in the above note.  ARRANGE: Lindsey [name] was educated on the importance of frequent visits to treat obesity as outlined per CMS and USPSTF guidelines and agreed to schedule her next follow up appointment today.  I, Burt Knack, am acting as transcriptionist for Quillian Quince, MD  I have reviewed the above documentation for accuracy and completeness, and I agree with the above. -Quillian Quince, MD

## 2018-02-01 ENCOUNTER — Ambulatory Visit (INDEPENDENT_AMBULATORY_CARE_PROVIDER_SITE_OTHER): Payer: BLUE CROSS/BLUE SHIELD | Admitting: Family Medicine

## 2018-02-01 VITALS — BP 111/61 | HR 69 | Temp 97.7°F | Ht 66.0 in | Wt 250.0 lb

## 2018-02-01 DIAGNOSIS — Z9189 Other specified personal risk factors, not elsewhere classified: Secondary | ICD-10-CM | POA: Diagnosis not present

## 2018-02-01 DIAGNOSIS — Z6841 Body Mass Index (BMI) 40.0 and over, adult: Secondary | ICD-10-CM

## 2018-02-01 DIAGNOSIS — E559 Vitamin D deficiency, unspecified: Secondary | ICD-10-CM | POA: Diagnosis not present

## 2018-02-01 DIAGNOSIS — B379 Candidiasis, unspecified: Secondary | ICD-10-CM

## 2018-02-01 MED ORDER — NYSTATIN 100000 UNIT/GM EX POWD: Freq: Two times a day (BID) | CUTANEOUS | 0 refills | Status: DC

## 2018-02-01 MED ORDER — VITAMIN D (ERGOCALCIFEROL) 1.25 MG (50000 UNIT) PO CAPS: 50000.0000 [IU] | ORAL_CAPSULE | ORAL | 0 refills | Status: DC

## 2018-02-07 NOTE — Progress Notes (Signed)
Office: [phone]  /  Fax: [phone]   HPI:   Chief Complaint: OBESITY Lindsey [name] is here to discuss her progress with her obesity treatment plan. She is on the  keep a food journal with 1500-1800 calories and 100+g of protein  daily and is following her eating plan approximately 70 % of the time. She states she is exercising 0 minutes 0 times per week. Lindsey [name] continues to do well with weight loss. She is journaling most days. She struggles to meet her protein goals at times.  Her weight is 250 lb (113.4 kg) today and has had a weight loss of 5 pounds over a period of 5 weeks since her last visit. She has lost 75 lbs since starting treatment with [place].  Vitamin D deficiency Lindsey [name] has a diagnosis of vitamin D deficiency. She is currently taking vit D and denies nausea, vomiting or muscle weakness.  Ref. Range 10/12/2017 10:00  Vitamin D, 25-Hydroxy Latest Ref Range: 30.0 - 100.0 ng/mL 42.1   Candidiasis Lindsey [name] notes reoccurring yeast infection under pannus and under breasts worsening over the last 2 weeks. No improvement noted with baby powder. She states the rash is erythematous and damp with a "yeasty" smell  At risk for osteopenia and osteoporosis Lindsey [name] is at higher risk of osteopenia and osteoporosis due to vitamin D deficiency.   ALLERGIES: Allergies  Allergen Reactions  . Lactose Intolerance (Gi) Other (See Comments)    Cramping  . Sulfa Antibiotics   . Amoxicillin Rash    MEDICATIONS: Current Outpatient Medications on File Prior to Visit  Medication Sig Dispense Refill  . Multiple Vitamins-Minerals (ICAPS AREDS 2 PO) Take 1 capsule by mouth 2 (two) times daily.    . Multiple Vitamins-Minerals (WOMENS MULTIVITAMIN PLUS PO) Take 1 tablet by mouth daily.     No current facility-administered medications on file prior to visit.     PAST MEDICAL HISTORY: Past Medical History:  Diagnosis Date  . Dry skin   . H/O seasonal allergies   . Hair loss   . Knee pain   . Multiple stiff  joints   . Nasal congestion   . Skin tag   . Snoring   . Urgency of urination     PAST SURGICAL HISTORY: Past Surgical History:  Procedure Laterality Date  . FOOT SURGERY    . TONSILLECTOMY      SOCIAL HISTORY: Social History   Tobacco Use  . Smoking status: Former Smoker    Packs/day: 1.00    Years: 15.00    Pack years: 15.00    Types: Cigarettes  . Smokeless tobacco: Never Used  . Tobacco comment: quit 10 years ago  Substance Use Topics  . Alcohol use: Yes    Comment: 1-2 drinks a month or less  . Drug use: No    FAMILY HISTORY: Family History  Problem Relation Age of Onset  . High blood pressure Mother   . Depression Mother   . Obesity Mother   . Diabetes Father   . Heart disease Father   . Cancer Father   . Sleep apnea Father   . Obesity Father     ROS: Review of Systems  Constitutional: Positive for weight loss.  Gastrointestinal: Negative for nausea and vomiting.  Musculoskeletal:       Negative for muscle weakness  Skin: Positive for rash.    PHYSICAL EXAM: Blood pressure 111/61, pulse 69, temperature 97.7 F (36.5 C), temperature source Oral, height 5\' 6"  (1.676 m), weight 250  lb (113.4 kg), SpO2 100 %. Body mass index is 40.35 kg/m. Physical Exam  Constitutional: She is oriented to person, place, and time. She appears well-developed and well-nourished.  HENT:  Head: Normocephalic.  Neck: Normal range of motion.  Cardiovascular: Normal rate.  Pulmonary/Chest: Effort normal.  Musculoskeletal: Normal range of motion.  Neurological: She is alert and oriented to person, place, and time.  Skin: Skin is warm and dry.  Psychiatric: She has a normal mood and affect. Her behavior is normal.  Vitals reviewed.   RECENT LABS AND TESTS: BMET    Component Value Date/Time   NA 143 10/12/2017 1000   K 4.3 10/12/2017 1000   CL 100 10/12/2017 1000   CO2 25 10/12/2017 1000   GLUCOSE 92 10/12/2017 1000   BUN 14 10/12/2017 1000   CREATININE 0.71  10/12/2017 1000   CALCIUM 9.6 10/12/2017 1000   GFRNONAA 92 10/12/2017 1000   GFRAA 106 10/12/2017 1000   Lab Results  Component Value Date   HGBA1C 5.5 10/12/2017   HGBA1C 5.7 (H) 05/25/2017   Lab Results  Component Value Date   INSULIN 19.1 10/12/2017   INSULIN 44.2 (H) 05/25/2017   CBC    Component Value Date/Time   WBC 6.0 05/25/2017 1147   RBC 4.46 05/25/2017 1147   HGB 13.6 05/25/2017 1147   HCT 40.7 05/25/2017 1147   MCV 91 05/25/2017 1147   MCH 30.5 05/25/2017 1147   MCHC 33.4 05/25/2017 1147   RDW 13.9 05/25/2017 1147   LYMPHSABS 1.8 05/25/2017 1147   EOSABS 0.2 05/25/2017 1147   BASOSABS 0.0 05/25/2017 1147   Iron/TIBC/Ferritin/ %Sat No results found for: IRON, TIBC, FERRITIN, IRONPCTSAT Lipid Panel     Component Value Date/Time   CHOL 150 10/12/2017 1000   TRIG 74 10/12/2017 1000   HDL 51 10/12/2017 1000   LDLCALC 84 10/12/2017 1000   Hepatic Function Panel     Component Value Date/Time   PROT 6.7 10/12/2017 1000   ALBUMIN 4.5 10/12/2017 1000   AST 21 10/12/2017 1000   ALT 29 10/12/2017 1000   ALKPHOS 74 10/12/2017 1000   BILITOT 1.1 10/12/2017 1000      Component Value Date/Time   TSH 2.070 05/25/2017 1147    Ref. Range 10/12/2017 10:00  Vitamin D, 25-Hydroxy Latest Ref Range: 30.0 - 100.0 ng/mL 42.1    ASSESSMENT AND PLAN: Vitamin D deficiency - Plan: Vitamin D, Ergocalciferol, (DRISDOL) 1.25 MG (50000 UT) CAPS capsule  Candidiasis - Plan: nystatin (MYCOSTATIN/NYSTOP) powder  At risk for osteoporosis  Class 3 severe obesity with serious comorbidity and body mass index (BMI) of 40.0 to 44.9 in adult, unspecified obesity type (HCC)  PLAN: Vitamin D Deficiency Lindsey [name] that low vitamin D levels contributes to fatigue and are associated with obesity, breast, and colon cancer. She agrees to continue to take prescription Vit D @50 ,000 IU every week #4 with no refills and will follow up for routine testing of vitamin D, at least 2-3  times per year. She was informed of the risk of over-replacement of vitamin D and agrees to not increase her dose unless she discusses this with us first. Agrees to follow up with our clinic as directed.   Candidiasis Discussed candidiasis with Lindsey [name]. She agrees to start Nystatin powder 100,000 IU/gm BID to affected area #1 with no refills. Agrees to follow up with our clinic as directed.   At risk for osteopenia and osteoporosis Lindsey [name] was given extended  (15 minutes) osteoporosis prevention  counseling today. Antanisha is at risk for osteopenia and osteoporosis due to her vitamin D deficiency. She was encouraged to take her vitamin D and follow her higher calcium diet and increase strengthening exercise to help strengthen her bones and decrease her risk of osteopenia and osteoporosis.  Obesity Lindsey [name] is currently in the action stage of change. As such, her goal is to continue with weight loss efforts She has agreed to keep a food journal with 1500-1800 calories and 10+g of protein daily.  Lindsey [name] has been instructed to work up to a goal of 150 minutes of combined cardio and strengthening exercise per week for weight loss and overall health benefits. We discussed the following Behavioral Modification Strategies today: increasing lean protein intake, work on meal planning and easy cooking plans and holiday eating strategies    Lindsey [name] has agreed to follow up with our clinic in 3 weeks. She was informed of the importance of frequent follow up visits to maximize her success with intensive lifestyle modifications for her multiple health conditions.   OBESITY BEHAVIORAL INTERVENTION VISIT  Today's visit was # 10   Starting weight: 325 lb Starting date: 05/25/17 Today's weight : Weight: 250 lb (113.4 kg)  Today's date: 02/01/18 Total lbs lost to date: 75 lb    ASK: We discussed the diagnosis of obesity with Lindsey [name] today and Lindsey [name] agreed to give [place] permission to discuss obesity behavioral modification  therapy today.  ASSESS: Lindsey [name] has the diagnosis of obesity and her BMI today is 40.37 Lindsey [name] is in the action stage of change   ADVISE: Lindsey [name] was educated on the multiple health risks of obesity as well as the benefit of weight loss to improve her health. She was advised of the need for long term treatment and the importance of lifestyle modifications to improve her current health and to decrease her risk of future health problems.  AGREE: Multiple dietary modification options and treatment options were discussed and  Lindsey [name] agreed to follow the recommendations documented in the above note.  ARRANGE: Lindsey [name] was educated on the importance of frequent visits to treat obesity as outlined per CMS and USPSTF guidelines and agreed to schedule her next follow up appointment today.  Lindsey [name], am acting as Energy manager for Quillian Quince, MD

## 2018-02-23 DIAGNOSIS — M1711 Unilateral primary osteoarthritis, right knee: Secondary | ICD-10-CM | POA: Diagnosis not present

## 2018-02-23 DIAGNOSIS — M1712 Unilateral primary osteoarthritis, left knee: Secondary | ICD-10-CM | POA: Diagnosis not present

## 2018-02-27 ENCOUNTER — Ambulatory Visit (INDEPENDENT_AMBULATORY_CARE_PROVIDER_SITE_OTHER): Payer: BLUE CROSS/BLUE SHIELD | Admitting: Family Medicine

## 2018-02-27 VITALS — BP 124/61 | HR 75 | Ht 66.0 in | Wt 246.0 lb

## 2018-02-27 DIAGNOSIS — E559 Vitamin D deficiency, unspecified: Secondary | ICD-10-CM | POA: Diagnosis not present

## 2018-02-27 DIAGNOSIS — Z6839 Body mass index (BMI) 39.0-39.9, adult: Secondary | ICD-10-CM

## 2018-02-27 DIAGNOSIS — Z9189 Other specified personal risk factors, not elsewhere classified: Secondary | ICD-10-CM

## 2018-02-27 MED ORDER — VITAMIN D (ERGOCALCIFEROL) 1.25 MG (50000 UNIT) PO CAPS: 50000.0000 [IU] | ORAL_CAPSULE | ORAL | 0 refills | Status: DC

## 2018-02-28 ENCOUNTER — Encounter (INDEPENDENT_AMBULATORY_CARE_PROVIDER_SITE_OTHER): Payer: Self-pay | Admitting: Family Medicine

## 2018-02-28 NOTE — Progress Notes (Signed)
Office: [phone]  /  Fax: [phone]   HPI:   Chief Complaint: OBESITY Lindsey [name] is here to discuss her progress with her obesity treatment plan. She is keeping a food journal with 1500 to 1800 calories and 100+ grams of protein and is following her eating plan approximately 80 % of the time. She states she is exercising 0 minutes 0 times per week. Lindsey [name] continues to do very well with weight loss. She is journaling on and off, but is worried about Christmas weight gain.  Her weight is 246 lb (111.6 kg) today and has had a weight loss of 4 pounds over a period of 4 weeks since her last visit. She has lost 79 lbs since starting treatment with [place].  Vitamin D deficiency Lindsey [name] has a diagnosis of vitamin D deficiency. She is currently taking vit D and is not yet at goal. She denies nausea, vomiting, or muscle weakness.  At risk for osteopenia and osteoporosis Lindsey [name] is at higher risk of osteopenia and osteoporosis due to vitamin D deficiency.   ALLERGIES: Allergies  Allergen Reactions  . Lactose Intolerance (Gi) Other (See Comments)    Cramping  . Sulfa Antibiotics   . Amoxicillin Rash    MEDICATIONS: Current Outpatient Medications on File Prior to Visit  Medication Sig Dispense Refill  . Multiple Vitamins-Minerals (WOMENS MULTIVITAMIN PLUS PO) Take 1 tablet by mouth daily.    Marland Kitchen nystatin (MYCOSTATIN/NYSTOP) powder Apply topically 2 (two) times daily. 60 g 0   No current facility-administered medications on file prior to visit.     PAST MEDICAL HISTORY: Past Medical History:  Diagnosis Date  . Dry skin   . H/O seasonal allergies   . Hair loss   . Knee pain   . Multiple stiff joints   . Nasal congestion   . Skin tag   . Snoring   . Urgency of urination     PAST SURGICAL HISTORY: Past Surgical History:  Procedure Laterality Date  . FOOT SURGERY    . TONSILLECTOMY      SOCIAL HISTORY: Social History   Tobacco Use  . Smoking status: Former Smoker    Packs/day: 1.00      Years: 15.00    Pack years: 15.00    Types: Cigarettes  . Smokeless tobacco: Never Used  . Tobacco comment: quit 10 years ago  Substance Use Topics  . Alcohol use: Yes    Comment: 1-2 drinks a month or less  . Drug use: No    FAMILY HISTORY: Family History  Problem Relation Age of Onset  . High blood pressure Mother   . Depression Mother   . Obesity Mother   . Diabetes Father   . Heart disease Father   . Cancer Father   . Sleep apnea Father   . Obesity Father     ROS: Review of Systems  Constitutional: Positive for weight loss.  Gastrointestinal: Negative for nausea and vomiting.  Musculoskeletal:       Negative for muscle weakness.    PHYSICAL EXAM: Blood pressure 124/61, pulse 75, height 5\' 6"  (1.676 m), weight 246 lb (111.6 kg), SpO2 97 %. Body mass index is 39.71 kg/m. Physical Exam  Constitutional: She is oriented to person, place, and time. She appears well-developed and well-nourished.  Cardiovascular: Normal rate.  Pulmonary/Chest: Effort normal.  Musculoskeletal: Normal range of motion.  Neurological: She is oriented to person, place, and time.  Skin: Skin is warm and dry.  Psychiatric: She has a normal mood  and affect. Her behavior is normal.  Vitals reviewed.   RECENT LABS AND TESTS: BMET    Component Value Date/Time   NA 143 10/12/2017 1000   K 4.3 10/12/2017 1000   CL 100 10/12/2017 1000   CO2 25 10/12/2017 1000   GLUCOSE 92 10/12/2017 1000   BUN 14 10/12/2017 1000   CREATININE 0.71 10/12/2017 1000   CALCIUM 9.6 10/12/2017 1000   GFRNONAA 92 10/12/2017 1000   GFRAA 106 10/12/2017 1000   Lab Results  Component Value Date   HGBA1C 5.5 10/12/2017   HGBA1C 5.7 (H) 05/25/2017   Lab Results  Component Value Date   INSULIN 19.1 10/12/2017   INSULIN 44.2 (H) 05/25/2017   CBC    Component Value Date/Time   WBC 6.0 05/25/2017 1147   RBC 4.46 05/25/2017 1147   HGB 13.6 05/25/2017 1147   HCT 40.7 05/25/2017 1147   MCV 91 05/25/2017  1147   MCH 30.5 05/25/2017 1147   MCHC 33.4 05/25/2017 1147   RDW 13.9 05/25/2017 1147   LYMPHSABS 1.8 05/25/2017 1147   EOSABS 0.2 05/25/2017 1147   BASOSABS 0.0 05/25/2017 1147   Iron/TIBC/Ferritin/ %Sat No results found for: IRON, TIBC, FERRITIN, IRONPCTSAT Lipid Panel     Component Value Date/Time   CHOL 150 10/12/2017 1000   TRIG 74 10/12/2017 1000   HDL 51 10/12/2017 1000   LDLCALC 84 10/12/2017 1000   Hepatic Function Panel     Component Value Date/Time   PROT 6.7 10/12/2017 1000   ALBUMIN 4.5 10/12/2017 1000   AST 21 10/12/2017 1000   ALT 29 10/12/2017 1000   ALKPHOS 74 10/12/2017 1000   BILITOT 1.1 10/12/2017 1000      Component Value Date/Time   TSH 2.070 05/25/2017 1147   Results for Lindsey RuddleHORNSBY, Lindsey [name] (MRN [id number]) as of 02/28/2018 12:50  Ref. Range 10/12/2017 10:00  Vitamin D, 25-Hydroxy Latest Ref Range: 30.0 - 100.0 ng/mL 42.1   ASSESSMENT AND PLAN: Vitamin D deficiency - Plan: Vitamin D, Ergocalciferol, (DRISDOL) 1.25 MG (50000 UT) CAPS capsule  At risk for osteoporosis  Class 2 severe obesity with serious comorbidity and body mass index (BMI) of 39.0 to 39.9 in adult, unspecified obesity type (HCC)  PLAN:  Vitamin D Deficiency Lindsey [name] that low vitamin D levels contributes to fatigue and are associated with obesity, breast, and colon cancer. She agrees to continue to take prescription Vit D @50 ,000 IU every week #4 with no refills and will follow up for routine testing of vitamin D, at least 2-3 times per year. She was informed of the risk of over-replacement of vitamin D and agrees to not increase her dose unless she discusses this with us first. We will check labs at her next visit and she agrees to follow up in 3 weeks.  At risk for osteopenia and osteoporosis Lindsey [name] was given extended (15 minutes) osteoporosis prevention counseling today. Lindsey [name] is at risk for osteopenia and osteoporosis due to her vitamin D deficiency. She was encouraged to  take her vitamin D and follow her higher calcium diet and increase strengthening exercise to help strengthen her bones and decrease her risk of osteopenia and osteoporosis.  Obesity Lindsey [name] is currently in the action stage of change. As such, her goal is to continue with weight loss efforts. She has agreed to keep a food journal with 1500 to 1800 calories and 100+ grams of protein.  Lindsey [name] has been instructed to work up to a goal of 150 minutes of  combined cardio and strengthening exercise per week for weight loss and overall health benefits. We discussed the following Behavioral Modification Strategies today: increasing lean protein intake, decreasing simple carbohydrates, holiday eating strategies, and travel eating strategies.  Lindsey [name] has agreed to follow up with our clinic in 3 weeks for a fasting appointment. She was informed of the importance of frequent follow up visits to maximize her success with intensive lifestyle modifications for her multiple health conditions.   OBESITY BEHAVIORAL INTERVENTION VISIT  Today's visit was # 11   Starting weight: 325 lbs Starting date: 05/25/17 Today's weight : Weight: 246 lb (111.6 kg)  Today's date: 02/27/2018 Total lbs lost to date: 71  ASK: We discussed the diagnosis of obesity with Lindsey [name] today and Nylia agreed to give [place] permission to discuss obesity behavioral modification therapy today.  ASSESS: Florice has the diagnosis of obesity and her BMI today is 39.7. Riven is in the action stage of change.   ADVISE: Lorien was educated on the multiple health risks of obesity as well as the benefit of weight loss to improve her health. She was advised of the need for long term treatment and the importance of lifestyle modifications to improve her current health and to decrease her risk of future health problems.  AGREE: Multiple dietary modification options and treatment options were discussed and Tremaine agreed to follow the recommendations documented in  the above note.  ARRANGE: Railyn was educated on the importance of frequent visits to treat obesity as outlined per CMS and USPSTF guidelines and agreed to schedule her next follow up appointment today.  I, Kirke Corin, am acting as transcriptionist for Wilder Glade, MD  I have reviewed the above documentation for accuracy and completeness, and I agree with the above. -Quillian Quince, MD

## 2018-03-08 ENCOUNTER — Encounter (INDEPENDENT_AMBULATORY_CARE_PROVIDER_SITE_OTHER): Payer: Self-pay | Admitting: Family Medicine

## 2018-03-09 ENCOUNTER — Ambulatory Visit (INDEPENDENT_AMBULATORY_CARE_PROVIDER_SITE_OTHER): Payer: BLUE CROSS/BLUE SHIELD | Admitting: Physician Assistant

## 2018-03-09 ENCOUNTER — Encounter (INDEPENDENT_AMBULATORY_CARE_PROVIDER_SITE_OTHER): Payer: Self-pay | Admitting: Physician Assistant

## 2018-03-09 VITALS — BP 114/72 | HR 68 | Temp 97.5°F | Ht 66.0 in | Wt 240.0 lb

## 2018-03-09 DIAGNOSIS — E7849 Other hyperlipidemia: Secondary | ICD-10-CM

## 2018-03-09 DIAGNOSIS — Z9189 Other specified personal risk factors, not elsewhere classified: Secondary | ICD-10-CM | POA: Diagnosis not present

## 2018-03-09 DIAGNOSIS — R7303 Prediabetes: Secondary | ICD-10-CM | POA: Diagnosis not present

## 2018-03-09 DIAGNOSIS — Z6838 Body mass index (BMI) 38.0-38.9, adult: Secondary | ICD-10-CM

## 2018-03-09 DIAGNOSIS — E559 Vitamin D deficiency, unspecified: Secondary | ICD-10-CM | POA: Diagnosis not present

## 2018-03-09 MED ORDER — VITAMIN D (ERGOCALCIFEROL) 1.25 MG (50000 UNIT) PO CAPS: 50000.0000 [IU] | ORAL_CAPSULE | ORAL | 0 refills | Status: DC

## 2018-03-09 NOTE — Progress Notes (Signed)
Office: [phone]  /  Fax: [phone]   HPI:   Chief Complaint: OBESITY Lindsey [name] is here to discuss her progress with her obesity treatment plan. She is on the keep a food journal with 1500 to 1800 calories and 100+ grams of protein daily plan and is following her eating plan approximately 60 % of the time. She states she is exercising 0 minutes 0 times per week. Lindsey [name] did well with weight loss. She reports that she has not followed the plan recently due to holiday eating. She is struggling to stay focused with all of the celebrations and foods. Her weight is 240 lb (108.9 kg) today and has had a weight loss of 6 pounds over a period of 1 to 2 weeks since her last visit. She has lost 75 lbs since starting treatment with us.  Pre-Diabetes Lindsey [name] has a diagnosis of prediabetes based on her elevated Hgb A1c and was informed this puts her at greater risk of developing diabetes. She is not taking metformin currently and continues to work on diet and exercise to decrease risk of diabetes. She denies polyphagia or hypoglycemia.  At risk for diabetes Lindsey [name] is at higher than average risk for developing diabetes due to her obesity and prediabetes. She currently denies polyuria or polydipsia.  Vitamin D deficiency Lindsey [name] has a diagnosis of vitamin D deficiency. She is currently taking vit D and denies nausea, vomiting or muscle weakness.  Hyperlipidemia Lindsey [name] has hyperlipidemia and she is not on medications. She has been trying to improve her cholesterol levels with intensive lifestyle modification including a low saturated fat diet, exercise and weight loss. She denies any chest pain, claudication or myalgias.  ASSESSMENT AND PLAN:  Prediabetes - Plan: Hemoglobin A1c, Insulin, random  Vitamin D deficiency - Plan: VITAMIN D 25 Hydroxy (Vit-D Deficiency, Fractures), Vitamin D, Ergocalciferol, (DRISDOL) 1.25 MG (50000 UT) CAPS capsule  Other hyperlipidemia - Plan: Comprehensive metabolic panel, Lipid  Panel With LDL/HDL Ratio  At risk for diabetes mellitus  Class 2 severe obesity with serious comorbidity and body mass index (BMI) of 38.0 to 38.9 in adult, unspecified obesity type [facility](HCC)  PLAN:  Pre-Diabetes Lindsey [name] will continue to work on weight loss, exercise, and decreasing simple carbohydrates in her diet to help decrease the risk of diabetes. She was informed that eating too many simple carbohydrates or too many calories at one sitting increases the likelihood of GI side effects. We will check labs and Lindsey [name] agreed to follow up with us as directed to monitor her progress.  Diabetes risk counseling Lindsey [name] was given extended (15 minutes) diabetes prevention counseling today. She is 62 y.o. female and has risk factors for diabetes including obesity and prediabetes. We discussed intensive lifestyle modifications today with an emphasis on weight loss as well as increasing exercise and decreasing simple carbohydrates in her diet.  Vitamin D Deficiency Lindsey [name] that low vitamin D levels contributes to fatigue and are associated with obesity, breast, and colon cancer. She agrees to continue to take prescription Vit D @50 ,000 IU every week #4 with no refills and will follow up for routine testing of vitamin D, at least 2-3 times per year. She was informed of the risk of over-replacement of vitamin D and agrees to not increase her dose unless she discusses this with us first. We will check labs today and Lindsey [name] agrees to follow up as directed.  Hyperlipidemia Lindsey [name] of the American Heart Association Guidelines emphasizing intensive lifestyle modifications as the first line  treatment for hyperlipidemia. We discussed many lifestyle modifications today in depth, and Lindsey [name] will continue to work on decreasing saturated fats such as fattyErskine [name] [name] meat, butter and many fried foods. She will also increase vegetables and lean protein in her diet and continue to work on exercise and weight loss  efforts.  Obesity Lindsey [name] is currently in the action stage of change. As such, her goal is to continue with weight loss efforts She has agreed to keep a food journal with 1500 to 1800 calories and 100+ grams of protein daily Lindsey [name] has been instructed to work up to a goal of 150 minutes of combined cardio and strengthening exercise per week for weight loss and overall health benefits. We discussed the following Behavioral Modification Strategies today: keeping healthy foods in the home and work on meal planning and easy cooking plans  Lindsey [name] has agreed to follow up with our clinic in on March 5th, 2020. She was informed of the importance of frequent follow up visits to maximize her success with intensive lifestyle modifications for her multiple health conditions.  ALLERGIES: Allergies  Allergen Reactions  . Lactose Intolerance (Gi) Other (See Comments)    Cramping  . Sulfa Antibiotics   . Amoxicillin Rash    MEDICATIONS: Current Outpatient Medications on File Prior to Visit  Medication Sig Dispense Refill  . Multiple Vitamins-Minerals (WOMENS MULTIVITAMIN PLUS PO) Take 1 tablet by mouth daily.    Marland Kitchen. nystatin (MYCOSTATIN/NYSTOP) powder Apply topically 2 (two) times daily. 60 g 0   No current facility-administered medications on file prior to visit.     PAST MEDICAL HISTORY: Past Medical History:  Diagnosis Date  . Dry skin   . H/O seasonal allergies   . Hair loss   . Knee pain   . Multiple stiff joints   . Nasal congestion   . Skin tag   . Snoring   . Urgency of urination     PAST SURGICAL HISTORY: Past Surgical History:  Procedure Laterality Date  . FOOT SURGERY    . TONSILLECTOMY      SOCIAL HISTORY: Social History   Tobacco Use  . Smoking status: Former Smoker    Packs/day: 1.00    Years: 15.00    Pack years: 15.00    Types: Cigarettes  . Smokeless tobacco: Never Used  . Tobacco comment: quit 10 years ago  Substance Use Topics  . Alcohol use: Yes    Comment:  1-2 drinks a month or less  . Drug use: No    FAMILY HISTORY: Family History  Problem Relation Age of Onset  . High blood pressure Mother   . Depression Mother   . Obesity Mother   . Diabetes Father   . Heart disease Father   . Cancer Father   . Sleep apnea Father   . Obesity Father     ROS: Review of Systems  Constitutional: Positive for weight loss.  Gastrointestinal: Negative for nausea and vomiting.  Genitourinary: Negative for frequency.  Musculoskeletal:       Negative for muscle weakness  Endo/Heme/Allergies: Negative for polydipsia.       Negative for polyphagia Negative for hypoglycemia    PHYSICAL EXAM: Blood pressure 114/72, pulse 68, temperature (!) 97.5 F (36.4 C), temperature source Oral, height 5\' 6"  (1.676 m), weight 240 lb (108.9 kg), SpO2 96 %. Body mass index is 38.74 kg/m. Physical Exam Vitals signs reviewed.  Constitutional:      Appearance: Normal appearance. She is well-developed. She is obese.  Cardiovascular:     Rate and Rhythm: Normal rate.  Pulmonary:     Effort: Pulmonary effort is normal.  Musculoskeletal: Normal range of motion.  Skin:    General: Skin is warm and dry.  Neurological:     Mental Status: She is alert and oriented to person, place, and time.  Psychiatric:        Mood and Affect: Mood normal.        Behavior: Behavior normal.     RECENT LABS AND TESTS: BMET    Component Value Date/Time   NA 143 10/12/2017 1000   K 4.3 10/12/2017 1000   CL 100 10/12/2017 1000   CO2 25 10/12/2017 1000   GLUCOSE 92 10/12/2017 1000   BUN 14 10/12/2017 1000   CREATININE 0.71 10/12/2017 1000   CALCIUM 9.6 10/12/2017 1000   GFRNONAA 92 10/12/2017 1000   GFRAA 106 10/12/2017 1000   Lab Results  Component Value Date   HGBA1C 5.5 10/12/2017   HGBA1C 5.7 (H) 05/25/2017   Lab Results  Component Value Date   INSULIN 19.1 10/12/2017   INSULIN 44.2 (H) 05/25/2017   CBC    Component Value Date/Time   WBC 6.0 05/25/2017 1147    RBC 4.46 05/25/2017 1147   HGB 13.6 05/25/2017 1147   HCT 40.7 05/25/2017 1147   MCV 91 05/25/2017 1147   MCH 30.5 05/25/2017 1147   MCHC 33.4 05/25/2017 1147   RDW 13.9 05/25/2017 1147   LYMPHSABS 1.8 05/25/2017 1147   EOSABS 0.2 05/25/2017 1147   BASOSABS 0.0 05/25/2017 1147   Iron/TIBC/Ferritin/ %Sat No results found for: IRON, TIBC, FERRITIN, IRONPCTSAT Lipid Panel     Component Value Date/Time   CHOL 150 10/12/2017 1000   TRIG 74 10/12/2017 1000   HDL 51 10/12/2017 1000   LDLCALC 84 10/12/2017 1000   Hepatic Function Panel     Component Value Date/Time   PROT 6.7 10/12/2017 1000   ALBUMIN 4.5 10/12/2017 1000   AST 21 10/12/2017 1000   ALT 29 10/12/2017 1000   ALKPHOS 74 10/12/2017 1000   BILITOT 1.1 10/12/2017 1000      Component Value Date/Time   TSH 2.070 05/25/2017 1147    Ref. Range 10/12/2017 10:00  Vitamin D, 25-Hydroxy Latest Ref Range: 30.0 - 100.0 ng/mL 42.1     OBESITY BEHAVIORAL INTERVENTION VISIT  Today's visit was # 12   Starting weight: 325 lbs Starting date: 05/25/2017 Today's weight : 240 lbs Today's date: 03/09/2018 Total lbs lost to date: 18   ASK: We discussed the diagnosis of obesity with Lindsey [name] today and Lindsey [name] agreed to give [place] permission to discuss obesity behavioral modification therapy today.  ASSESS: Anikka has the diagnosis of obesity and her BMI today is 38.76 Gavyn is in the action stage of change   ADVISE: Jolane was educated on the multiple health risks of obesity as well as the benefit of weight loss to improve her health. She was advised of the need for long term treatment and the importance of lifestyle modifications to improve her current health and to decrease her risk of future health problems.  AGREE: Multiple dietary modification options and treatment options were discussed and  Denetta agreed to follow the recommendations documented in the above note.  ARRANGE: Lovada was educated on the importance of frequent  visits to treat obesity as outlined per CMS and USPSTF guidelines and agreed to schedule her next follow up appointment today.  Cristi Loron, am acting as  transcriptionist for Alois Cliche, PA-C I, Alois Cliche, PA-C have reviewed above note and agree with its content

## 2018-03-09 NOTE — Telephone Encounter (Signed)
Marylu LundJanet, can you please look into this?

## 2018-03-10 LAB — COMPREHENSIVE METABOLIC PANEL
ALT: 20 IU/L (ref 0–32)
AST: 17 IU/L (ref 0–40)
Albumin/Globulin Ratio: 2 (ref 1.2–2.2)
Albumin: 4.4 g/dL (ref 3.6–4.8)
Alkaline Phosphatase: 79 IU/L (ref 39–117)
BUN/Creatinine Ratio: 24 (ref 12–28)
BUN: 18 mg/dL (ref 8–27)
Bilirubin Total: 0.8 mg/dL (ref 0.0–1.2)
CO2: 26 mmol/L (ref 20–29)
Calcium: 9.7 mg/dL (ref 8.7–10.3)
Chloride: 102 mmol/L (ref 96–106)
Creatinine, Ser: 0.75 mg/dL (ref 0.57–1.00)
GFR calc Af Amer: 99 mL/min/{1.73_m2} (ref >59)
GFR, EST NON AFRICAN AMERICAN: 86 mL/min/{1.73_m2} (ref >59)
Globulin, Total: 2.2 g/dL (ref 1.5–4.5)
Glucose: 92 mg/dL (ref 65–99)
Potassium: 4.7 mmol/L (ref 3.5–5.2)
Sodium: 141 mmol/L (ref 134–144)
Total Protein: 6.6 g/dL (ref 6.0–8.5)

## 2018-03-10 LAB — HEMOGLOBIN A1C
ESTIMATED AVERAGE GLUCOSE: 108 mg/dL
Hgb A1c MFr Bld: 5.4 % (ref 4.8–5.6)

## 2018-03-10 LAB — LIPID PANEL WITH LDL/HDL RATIO
Cholesterol, Total: 162 mg/dL (ref 100–199)
HDL: 65 mg/dL (ref >39)
LDL Calculated: 85 mg/dL (ref 0–99)
LDl/HDL Ratio: 1.3 ratio (ref 0.0–3.2)
Triglycerides: 61 mg/dL (ref 0–149)
VLDL Cholesterol Cal: 12 mg/dL (ref 5–40)

## 2018-03-10 LAB — INSULIN, RANDOM: INSULIN: 10 u[IU]/mL (ref 2.6–24.9)

## 2018-03-10 LAB — VITAMIN D 25 HYDROXY (VIT D DEFICIENCY, FRACTURES): Vit D, 25-Hydroxy: 43 ng/mL (ref 30.0–100.0)

## 2018-03-20 ENCOUNTER — Ambulatory Visit (INDEPENDENT_AMBULATORY_CARE_PROVIDER_SITE_OTHER): Payer: Self-pay | Admitting: Bariatrics

## 2018-05-25 ENCOUNTER — Ambulatory Visit (INDEPENDENT_AMBULATORY_CARE_PROVIDER_SITE_OTHER): Payer: Self-pay | Admitting: Family Medicine

## 2018-06-05 ENCOUNTER — Ambulatory Visit (INDEPENDENT_AMBULATORY_CARE_PROVIDER_SITE_OTHER): Payer: Self-pay | Admitting: Family Medicine

## 2018-06-26 ENCOUNTER — Other Ambulatory Visit: Payer: Self-pay

## 2018-06-26 ENCOUNTER — Ambulatory Visit (INDEPENDENT_AMBULATORY_CARE_PROVIDER_SITE_OTHER): Payer: BC Managed Care – PPO | Admitting: Family Medicine

## 2018-06-26 ENCOUNTER — Encounter (INDEPENDENT_AMBULATORY_CARE_PROVIDER_SITE_OTHER): Payer: Self-pay | Admitting: Family Medicine

## 2018-06-26 DIAGNOSIS — E559 Vitamin D deficiency, unspecified: Secondary | ICD-10-CM | POA: Diagnosis not present

## 2018-06-26 DIAGNOSIS — Z6838 Body mass index (BMI) 38.0-38.9, adult: Secondary | ICD-10-CM | POA: Diagnosis not present

## 2018-06-26 MED ORDER — VITAMIN D (ERGOCALCIFEROL) 1.25 MG (50000 UNIT) PO CAPS: 50000.0000 [IU] | ORAL_CAPSULE | ORAL | 0 refills | Status: DC

## 2018-06-26 NOTE — Progress Notes (Signed)
Office: [phone]  /  Fax: [phone] TeleHealth Visit:  Lindsey [name] has verbally consented to this TeleHealth visit today. The patient is located at home, the provider is located at the [facility] office. The participants in this visit include the listed provider and patient. Elliet was unable to use Webex today and the Telehealth visit was conducted via telephone.  HPI:   Chief Complaint: OBESITY Lindsey [name] is here to discuss her progress with her obesity treatment plan. She is keep a food journal with 1500 to 1800 calories and 100 grams of protein and is following her eating plan approximately 85 % of the time. She states she is walking 12 blocks 30 minutes 5 times per week. Lindsey [name]'s last visit was 3 months ago and she thinks that she has lost another 10 pounds. She has a lot of questions about food choices, her resting metabolic rate, and meal ideas.   We were unable to weigh the patient today for this TeleHealth visit. She feels as if she has lost weight since her last visit. She has lost 85 lbs since starting treatment with [place].  Vitamin D Deficiency Lindsey [name] has a diagnosis of vitamin D deficiency. She is currently stable on vit D, but is not yet at goal.  ASSESSMENT AND PLAN:  Vitamin D deficiency - Plan: Vitamin D, Ergocalciferol, (DRISDOL) 1.25 MG (50000 UT) CAPS capsule  Class 2 severe obesity with serious comorbidity and body mass index (BMI) of 38.0 to 38.9 in adult, unspecified obesity type (HCC)  PLAN:  Vitamin D Deficiency Lindsey [name] that low vitamin D levels contribute to fatigue and are associated with obesity, breast, and colon cancer. Lindsey [name] agrees to continue to take prescription Vit D @50 ,000 IU every week #4 with no refills and will follow up for routine testing of vitamin D, at least 2-3 times per year. She was informed of the risk of over-replacement of vitamin D and agrees to not increase her dose unless she discusses this with [place] first. Lindsey [name] agrees  to follow up in 4 weeks as directed.  I spent > than 50% of the 25 minute visit on counseling as documented in the note.  Obesity Lindsey [name] is currently in the action stage of change. As such, her goal is to continue with weight loss efforts. She has agreed to keep a food journal with 1500 to 1800 calories and 100 grams of protein.  Lindsey [name] has been instructed to work up to a goal of 150 minutes of combined cardio and strengthening exercise per week for weight loss and overall health benefits. We discussed the following Behavioral Modification Strategies today: increasing lean protein intake, decreasing simple carbohydrates, keeping healthy foods in the home, and work on meal planning and easy cooking plans.  Lindsey [name] has agreed to follow up with our clinic in 4 weeks. She was informed of the importance of frequent follow up visits to maximize her success with intensive lifestyle modifications for her multiple health conditions.  ALLERGIES: Allergies  Allergen Reactions  . Lactose Intolerance (Gi) Other (See Comments)    Cramping  . Sulfa Antibiotics   . Amoxicillin Rash    MEDICATIONS: Current Outpatient Medications on File Prior to Visit  Medication Sig Dispense Refill  . Multiple Vitamins-Minerals (WOMENS MULTIVITAMIN PLUS PO) Take 1 tablet by mouth daily.    Marland Kitchen nystatin (MYCOSTATIN/NYSTOP) powder Apply topically 2 (two) times daily. 60 g 0   No current facility-administered medications on file prior to visit.  PAST MEDICAL HISTORY: Past Medical History:  Diagnosis Date  . Dry skin   . H/O seasonal allergies   . Hair loss   . Knee pain   . Multiple stiff joints   . Nasal congestion   . Skin tag   . Snoring   . Urgency of urination     PAST SURGICAL HISTORY: Past Surgical History:  Procedure Laterality Date  . FOOT SURGERY    . TONSILLECTOMY      SOCIAL HISTORY: Social History   Tobacco Use  . Smoking status: Former Smoker    Packs/day: 1.00    Years: 15.00    Pack  years: 15.00    Types: Cigarettes  . Smokeless tobacco: Never Used  . Tobacco comment: quit 10 years ago  Substance Use Topics  . Alcohol use: Yes    Comment: 1-2 drinks a month or less  . Drug use: No    FAMILY HISTORY: Family History  Problem Relation Age of Onset  . High blood pressure Mother   . Depression Mother   . Obesity Mother   . Diabetes Father   . Heart disease Father   . Cancer Father   . Sleep apnea Father   . Obesity Father     PHYSICAL EXAM: Pt in no acute distress  RECENT LABS AND TESTS: BMET    Component Value Date/Time   NA 141 03/09/2018 0922   K 4.7 03/09/2018 0922   CL 102 03/09/2018 0922   CO2 26 03/09/2018 0922   GLUCOSE 92 03/09/2018 0922   BUN 18 03/09/2018 0922   CREATININE 0.75 03/09/2018 0922   CALCIUM 9.7 03/09/2018 0922   GFRNONAA 86 03/09/2018 0922   GFRAA 99 03/09/2018 0922   Lab Results  Component Value Date   HGBA1C 5.4 03/09/2018   HGBA1C 5.5 10/12/2017   HGBA1C 5.7 (H) 05/25/2017   Lab Results  Component Value Date   INSULIN 10.0 03/09/2018   INSULIN 19.1 10/12/2017   INSULIN 44.2 (H) 05/25/2017   CBC    Component Value Date/Time   WBC 6.0 05/25/2017 1147   RBC 4.46 05/25/2017 1147   HGB 13.6 05/25/2017 1147   HCT 40.7 05/25/2017 1147   MCV 91 05/25/2017 1147   MCH 30.5 05/25/2017 1147   MCHC 33.4 05/25/2017 1147   RDW 13.9 05/25/2017 1147   LYMPHSABS 1.8 05/25/2017 1147   EOSABS 0.2 05/25/2017 1147   BASOSABS 0.0 05/25/2017 1147   Iron/TIBC/Ferritin/ %Sat No results found for: IRON, TIBC, FERRITIN, IRONPCTSAT Lipid Panel     Component Value Date/Time   CHOL 162 03/09/2018 0922   TRIG 61 03/09/2018 0922   HDL 65 03/09/2018 0922   LDLCALC 85 03/09/2018 0922   Hepatic Function Panel     Component Value Date/Time   PROT 6.6 03/09/2018 0922   ALBUMIN 4.4 03/09/2018 0922   AST 17 03/09/2018 0922   ALT 20 03/09/2018 0922   ALKPHOS 79 03/09/2018 0922   BILITOT 0.8 03/09/2018 0922      Component  Value Date/Time   TSH 2.070 05/25/2017 1147   Results for Georg RuddleHORNSBY, Camira E (MRN [id number]) as of 06/26/2018 13:08  Ref. Range 03/09/2018 09:22  Vitamin D, 25-Hydroxy Latest Ref Range: 30.0 - 100.0 ng/mL 43.0     I, Kirke Corinara Soares, CMA, am acting as transcriptionist for Wilder Gladearen D. Liev Brockbank, MD I have reviewed the above documentation for accuracy and completeness, and I agree with the above. -Quillian Quincearen Calliope Delangel, MD

## 2018-07-15 ENCOUNTER — Other Ambulatory Visit (INDEPENDENT_AMBULATORY_CARE_PROVIDER_SITE_OTHER): Payer: Self-pay | Admitting: Family Medicine

## 2018-07-15 DIAGNOSIS — E559 Vitamin D deficiency, unspecified: Secondary | ICD-10-CM

## 2018-07-24 ENCOUNTER — Ambulatory Visit (INDEPENDENT_AMBULATORY_CARE_PROVIDER_SITE_OTHER): Payer: BC Managed Care – PPO | Admitting: Family Medicine

## 2018-07-24 ENCOUNTER — Other Ambulatory Visit: Payer: Self-pay

## 2018-07-24 ENCOUNTER — Encounter (INDEPENDENT_AMBULATORY_CARE_PROVIDER_SITE_OTHER): Payer: Self-pay | Admitting: Family Medicine

## 2018-07-24 DIAGNOSIS — E559 Vitamin D deficiency, unspecified: Secondary | ICD-10-CM

## 2018-07-24 DIAGNOSIS — Z6838 Body mass index (BMI) 38.0-38.9, adult: Secondary | ICD-10-CM | POA: Diagnosis not present

## 2018-07-24 MED ORDER — VITAMIN D (ERGOCALCIFEROL) 1.25 MG (50000 UNIT) PO CAPS: 50000.0000 [IU] | ORAL_CAPSULE | ORAL | 0 refills | Status: DC

## 2018-07-24 NOTE — Progress Notes (Signed)
Office: [phone]  /  Fax: [phone] TeleHealth Visit:  Georg RuddleJane E [name] has verbally consented to this TeleHealth visit today. The patient is located at home, the provider is located at the [facility] office. The participants in this visit include the listed provider and patient. The visit was conducted today via Webex.  HPI:   Chief Complaint: OBESITY Lindsey [name] is here to discuss her progress with her obesity treatment plan. She is keeping a food journal with 1500 to 1800 calories and 100 grams of protein and is following her eating plan approximately 80 % of the time. She states she is walking 30 minutes 5 times per week. Lindsey [name] is uncertain if she is maintaining her weight. She journals daily and feels like she has crossed over from being mindful to thinking about weight loss obsessively. She states that she has an obsessive personality overall. Lindsey [name] had an appointment with Dr. Dewaine CongerBarker, but has not followed up.  We were unable to weigh the patient today for this TeleHealth visit. She feels uncertain if she has maintained weight since her last visit. She has lost 85 lbs since starting treatment with us.  Vitamin D Deficiency Lindsey [name] has a diagnosis of vitamin D deficiency. She is currently stable on vit D. She asks for extra pills to be written on her prescription. Lindsey [name] denies nausea, vomiting, or muscle weakness.  ASSESSMENT AND PLAN:  Vitamin D deficiency - Plan: Vitamin D, Ergocalciferol, (DRISDOL) 1.25 MG (50000 UT) CAPS capsule  Class 2 severe obesity with serious comorbidity and body mass index (BMI) of 38.0 to 38.9 in adult, unspecified obesity type (HCC)  PLAN:  Vitamin D Deficiency Lindsey [name] that low vitamin D levels contribute to fatigue and are associated with obesity, breast, and colon cancer. Lindsey [name] agrees to continue to take prescription Vit D @50 ,000 IU every week #6 per her request with no refills and will follow up for routine testing of vitamin D, at  least 2-3 times per year. She was informed of the risk of over-replacement of vitamin D and agrees to not increase her dose unless she discusses this with us first. Lindsey [name] agrees to follow up in 4 weeks as directed.  I spent > than 50% of the 25 minute visit on counseling as documented in the note.  Obesity Lindsey [name] is currently in the action stage of change. As such, her goal is to continue with weight loss efforts. She has agreed to keep a food journal with 1500 to 1800 calories and 90+ grams of protein.  Lindsey [name] has been instructed to work up to a goal of 150 minutes of combined cardio and strengthening exercise per week for weight loss and overall health benefits. We discussed the following Behavioral Modification Strategies today: work on meal planning and easy cooking plans,keeping healthy foods in the home, emotional eating strategies, and ways to avoid boredom eating. Lindsey [name] was advised to not weigh at home and to concentrate on non-scale related results, such as increased energy, better sleep, increased exercise and endurance, etc.  Lindsey [name] has agreed to follow up with our clinic in 4 weeks. She was informed of the importance of frequent follow up visits to maximize her success with intensive lifestyle modifications for her multiple health conditions.  ALLERGIES: Allergies  Allergen Reactions  . Lactose Intolerance (Gi) Other (See Comments)    Cramping  . Sulfa Antibiotics   . Amoxicillin Rash    MEDICATIONS: Current Outpatient Medications on File Prior to Visit  Medication Sig Dispense  Refill  . Multiple Vitamins-Minerals (WOMENS MULTIVITAMIN PLUS PO) Take 1 tablet by mouth daily.    Marland Kitchen nystatin (MYCOSTATIN/NYSTOP) powder Apply topically 2 (two) times daily. 60 g 0  . Vitamin D, Ergocalciferol, (DRISDOL) 1.25 MG (50000 UT) CAPS capsule Take 1 capsule (50,000 Units total) by mouth every 7 (seven) days. 4 capsule 0   No current facility-administered medications on file prior to visit.      PAST MEDICAL HISTORY: Past Medical History:  Diagnosis Date  . Dry skin   . H/O seasonal allergies   . Hair loss   . Knee pain   . Multiple stiff joints   . Nasal congestion   . Skin tag   . Snoring   . Urgency of urination     PAST SURGICAL HISTORY: Past Surgical History:  Procedure Laterality Date  . FOOT SURGERY    . TONSILLECTOMY      SOCIAL HISTORY: Social History   Tobacco Use  . Smoking status: Former Smoker    Packs/day: 1.00    Years: 15.00    Pack years: 15.00    Types: Cigarettes  . Smokeless tobacco: Never Used  . Tobacco comment: quit 10 years ago  Substance Use Topics  . Alcohol use: Yes    Comment: 1-2 drinks a month or less  . Drug use: No    FAMILY HISTORY: Family History  Problem Relation Age of Onset  . High blood pressure Mother   . Depression Mother   . Obesity Mother   . Diabetes Father   . Heart disease Father   . Cancer Father   . Sleep apnea Father   . Obesity Father     ROS: Review of Systems  Gastrointestinal: Negative for nausea and vomiting.  Musculoskeletal:       Negative for muscle weakness.    PHYSICAL EXAM: Pt in no acute distress  RECENT LABS AND TESTS: BMET    Component Value Date/Time   NA 141 03/09/2018 0922   K 4.7 03/09/2018 0922   CL 102 03/09/2018 0922   CO2 26 03/09/2018 0922   GLUCOSE 92 03/09/2018 0922   BUN 18 03/09/2018 0922   CREATININE 0.75 03/09/2018 0922   CALCIUM 9.7 03/09/2018 0922   GFRNONAA 86 03/09/2018 0922   GFRAA 99 03/09/2018 0922   Lab Results  Component Value Date   HGBA1C 5.4 03/09/2018   HGBA1C 5.5 10/12/2017   HGBA1C 5.7 (H) 05/25/2017   Lab Results  Component Value Date   INSULIN 10.0 03/09/2018   INSULIN 19.1 10/12/2017   INSULIN 44.2 (H) 05/25/2017   CBC    Component Value Date/Time   WBC 6.0 05/25/2017 1147   RBC 4.46 05/25/2017 1147   HGB 13.6 05/25/2017 1147   HCT 40.7 05/25/2017 1147   MCV 91 05/25/2017 1147   MCH 30.5 05/25/2017 1147   MCHC 33.4  05/25/2017 1147   RDW 13.9 05/25/2017 1147   LYMPHSABS 1.8 05/25/2017 1147   EOSABS 0.2 05/25/2017 1147   BASOSABS 0.0 05/25/2017 1147   Iron/TIBC/Ferritin/ %Sat No results found for: IRON, TIBC, FERRITIN, IRONPCTSAT Lipid Panel     Component Value Date/Time   CHOL 162 03/09/2018 0922   TRIG 61 03/09/2018 0922   HDL 65 03/09/2018 0922   LDLCALC 85 03/09/2018 0922   Hepatic Function Panel     Component Value Date/Time   PROT 6.6 03/09/2018 0922   ALBUMIN 4.4 03/09/2018 0922   AST 17 03/09/2018 0922   ALT 20 03/09/2018 1610  ALKPHOS 79 03/09/2018 0922   BILITOT 0.8 03/09/2018 0922      Component Value Date/Time   TSH 2.070 05/25/2017 1147   Results for ALIANNY, SILVERSMITH (MRN [id number]) as of 07/24/2018 15:34  Ref. Range 03/09/2018 09:22  Vitamin D, 25-Hydroxy Latest Ref Range: 30.0 - 100.0 ng/mL 43.0     I, Kirke Corin, CMA, am acting as transcriptionist for Wilder Glade, MD I have reviewed the above documentation for accuracy and completeness, and I agree with the above. -Quillian Quince, MD

## 2018-08-15 ENCOUNTER — Encounter (INDEPENDENT_AMBULATORY_CARE_PROVIDER_SITE_OTHER): Payer: Self-pay | Admitting: Family Medicine

## 2018-08-15 NOTE — Telephone Encounter (Signed)
Please advise 

## 2018-08-21 ENCOUNTER — Ambulatory Visit (INDEPENDENT_AMBULATORY_CARE_PROVIDER_SITE_OTHER): Payer: Self-pay | Admitting: Family Medicine

## 2018-08-25 ENCOUNTER — Other Ambulatory Visit (INDEPENDENT_AMBULATORY_CARE_PROVIDER_SITE_OTHER): Payer: Self-pay | Admitting: Family Medicine

## 2018-08-25 DIAGNOSIS — E559 Vitamin D deficiency, unspecified: Secondary | ICD-10-CM

## 2018-08-30 ENCOUNTER — Encounter (INDEPENDENT_AMBULATORY_CARE_PROVIDER_SITE_OTHER): Payer: Self-pay | Admitting: Family Medicine

## 2018-08-31 NOTE — Telephone Encounter (Signed)
Please advise her to change to OTC vit D 5k per day until we can check her vit D levels as she is at risk of being over replaced

## 2018-08-31 NOTE — Telephone Encounter (Signed)
Please advise,  emailed form about r/f on 08/30/2018.

## 2018-09-04 ENCOUNTER — Encounter (INDEPENDENT_AMBULATORY_CARE_PROVIDER_SITE_OTHER): Payer: Self-pay

## 2018-09-14 ENCOUNTER — Other Ambulatory Visit (HOSPITAL_COMMUNITY)
Admission: RE | Admit: 2018-09-14 | Discharge: 2018-09-14 | Disposition: A | Discharge status: Home / Self Care | Payer: BC Managed Care – PPO | Source: Ambulatory Visit | Attending: Family Medicine | Admitting: Family Medicine

## 2018-09-14 ENCOUNTER — Other Ambulatory Visit: Payer: Self-pay | Admitting: Family Medicine

## 2018-09-14 DIAGNOSIS — Z124 Encounter for screening for malignant neoplasm of cervix: Secondary | ICD-10-CM | POA: Insufficient documentation

## 2018-09-19 LAB — CYTOLOGY - PAP
Diagnosis: NEGATIVE
HPV: NOT DETECTED

## 2018-09-26 ENCOUNTER — Ambulatory Visit (INDEPENDENT_AMBULATORY_CARE_PROVIDER_SITE_OTHER): Payer: BC Managed Care – PPO | Admitting: Family Medicine

## 2018-09-28 ENCOUNTER — Telehealth (INDEPENDENT_AMBULATORY_CARE_PROVIDER_SITE_OTHER): Payer: BC Managed Care – PPO | Admitting: Family Medicine

## 2018-09-28 ENCOUNTER — Other Ambulatory Visit: Payer: Self-pay

## 2018-09-28 ENCOUNTER — Encounter (INDEPENDENT_AMBULATORY_CARE_PROVIDER_SITE_OTHER): Payer: Self-pay | Admitting: Family Medicine

## 2018-09-28 DIAGNOSIS — E559 Vitamin D deficiency, unspecified: Secondary | ICD-10-CM

## 2018-09-28 DIAGNOSIS — Z6838 Body mass index (BMI) 38.0-38.9, adult: Secondary | ICD-10-CM

## 2018-10-02 NOTE — Progress Notes (Signed)
Office: [phone]  /  Fax: [phone] TeleHealth Visit:  Lindsey [name] has verbally consented to this TeleHealth visit today. The patient is located at home, the provider is located at the [facility] office. The participants in this visit include the listed provider and patient. Lindsey [name] was unable to use realtime audiovisual technology today and the telehealth visit was conducted via telephone.  HPI:   Chief Complaint: OBESITY Lindsey [name] is here to discuss her progress with her obesity treatment plan. She is on the  keep a food journal with 1500-1800 calories and 100g of protein daily and is following her eating plan approximately 80 % of the time. She states she is exercising by walking for 30 minutes 6-7 times per week. Lindsey [name] has lost 25 lbs this year in the last 27 weeks. She is disappointed in this even though she is doing well meeting our goals. She is very focused on the number on the scale and her goal is to get below 194 lbs which was her college weight. She has been advised to see our bariatric psychologist more than once but has not done so yet.  We were unable to weigh the patient today for this TeleHealth visit. She feels as if she has lost weight since her last visit. She has lost 85 lbs since starting treatment with [place].  Vitamin D deficiency Lindsey [name] has a diagnosis of vitamin D deficiency. She is currently taking vit D and denies nausea, vomiting or muscle weakness.  ASSESSMENT AND PLAN:  Vitamin D deficiency  Class 2 severe obesity with serious comorbidity and body mass index (BMI) of 38.0 to 38.9 in adult, unspecified obesity type (Lindsey [name])  PLAN: Vitamin D Deficiency Lindsey [name] that low vitamin D levels contributes to fatigue and are associated with obesity, breast, and colon cancer. She agrees to continue to take over-the-counter vit D until next in office visit and will follow up for routine testing of vitamin D, at least 2-3 times per year. She was informed  of the risk of over-replacement of vitamin D and agrees to not increase her dose unless she discusses this with [place] first.  I spent > than 50% of the 25 minute visit on counseling as documented in the note.  Obesity Lindsey [name] is currently in the action stage of change. As such, her goal is to continue with weight loss efforts She has agreed to keep a food journal with 1500-1800 calories and 100g of protein daily.  [place] has been instructed to work up to a goal of 150 minutes of combined cardio and strengthening exercise per week for weight loss and overall health benefits. We discussed the following Behavioral Modification Stratagies today: no skipping meals,  work on meal planning and easy cooking plans and emotional eating strategies   Lindsey [name] has agreed to follow up with our clinic in 6 weeks. She was informed of the importance of frequent follow up visits to maximize her success with intensive lifestyle modifications for her multiple health conditions.  ALLERGIES: Allergies  Allergen Reactions  . Lactose Intolerance (Gi) Other (See Comments)    Cramping  . Sulfa Antibiotics   . Amoxicillin Rash    MEDICATIONS: Current Outpatient Medications on File Prior to Visit  Medication Sig Dispense Refill  . Cholecalciferol (VITAMIN D3) 125 MCG (5000 UT) CAPS Take 1 capsule by mouth daily.    . Multiple Vitamins-Minerals (WOMENS MULTIVITAMIN PLUS PO) Take 1 tablet by mouth daily.    Marland Kitchen nystatin (MYCOSTATIN/NYSTOP) powder Apply  topically 2 (two) times daily. 60 g 0   No current facility-administered medications on file prior to visit.     PAST MEDICAL HISTORY: Past Medical History:  Diagnosis Date  . Dry skin   . H/O seasonal allergies   . Hair loss   . Knee pain   . Multiple stiff joints   . Nasal congestion   . Skin tag   . Snoring   . Urgency of urination     PAST SURGICAL HISTORY: Past Surgical History:  Procedure Laterality Date  . FOOT SURGERY    . TONSILLECTOMY      SOCIAL  HISTORY: Social History   Tobacco Use  . Smoking status: Former Smoker    Packs/day: 1.00    Years: 15.00    Pack years: 15.00    Types: Cigarettes  . Smokeless tobacco: Never Used  . Tobacco comment: quit 10 years ago  Substance Use Topics  . Alcohol use: Yes    Comment: 1-2 drinks a month or less  . Drug use: No    FAMILY HISTORY: Family History  Problem Relation Age of Onset  . High blood pressure Mother   . Depression Mother   . Obesity Mother   . Diabetes Father   . Heart disease Father   . Cancer Father   . Sleep apnea Father   . Obesity Father     ROS: Review of Systems  Gastrointestinal: Negative for nausea and vomiting.  Musculoskeletal:       Negative for muscle weakness    PHYSICAL EXAM: Pt in no acute distress  RECENT LABS AND TESTS: BMET    Component Value Date/Time   NA 141 03/09/2018 0922   K 4.7 03/09/2018 0922   CL 102 03/09/2018 0922   CO2 26 03/09/2018 0922   GLUCOSE 92 03/09/2018 0922   BUN 18 03/09/2018 0922   CREATININE 0.75 03/09/2018 0922   CALCIUM 9.7 03/09/2018 0922   GFRNONAA 86 03/09/2018 0922   GFRAA 99 03/09/2018 0922   Lab Results  Component Value Date   HGBA1C 5.4 03/09/2018   HGBA1C 5.5 10/12/2017   HGBA1C 5.7 (H) 05/25/2017   Lab Results  Component Value Date   INSULIN 10.0 03/09/2018   INSULIN 19.1 10/12/2017   INSULIN 44.2 (H) 05/25/2017   CBC    Component Value Date/Time   WBC 6.0 05/25/2017 1147   RBC 4.46 05/25/2017 1147   HGB 13.6 05/25/2017 1147   HCT 40.7 05/25/2017 1147   MCV 91 05/25/2017 1147   MCH 30.5 05/25/2017 1147   MCHC 33.4 05/25/2017 1147   RDW 13.9 05/25/2017 1147   LYMPHSABS 1.8 05/25/2017 1147   EOSABS 0.2 05/25/2017 1147   BASOSABS 0.0 05/25/2017 1147   Iron/TIBC/Ferritin/ %Sat No results found for: IRON, TIBC, FERRITIN, IRONPCTSAT Lipid Panel     Component Value Date/Time   CHOL 162 03/09/2018 0922   TRIG 61 03/09/2018 0922   HDL 65 03/09/2018 0922   LDLCALC 85  03/09/2018 0922   Hepatic Function Panel     Component Value Date/Time   PROT 6.6 03/09/2018 0922   ALBUMIN 4.4 03/09/2018 0922   AST 17 03/09/2018 0922   ALT 20 03/09/2018 0922   ALKPHOS 79 03/09/2018 0922   BILITOT 0.8 03/09/2018 0922      Component Value Date/Time   TSH 2.070 05/25/2017 1147     Ref. Range 03/09/2018 09:22  Vitamin D, 25-Hydroxy Latest Ref Range: 30.0 - 100.0 ng/mL 43.0  I, Renee Ramus, am acting as transcriptionist for Dennard Nip, MD  I have reviewed the above documentation for accuracy and completeness, and I agree with the above. -Dennard Nip, MD

## 2018-11-07 ENCOUNTER — Encounter (INDEPENDENT_AMBULATORY_CARE_PROVIDER_SITE_OTHER): Payer: Self-pay | Admitting: Family Medicine

## 2018-11-07 ENCOUNTER — Ambulatory Visit (INDEPENDENT_AMBULATORY_CARE_PROVIDER_SITE_OTHER): Payer: BC Managed Care – PPO | Admitting: Family Medicine

## 2018-11-07 ENCOUNTER — Other Ambulatory Visit: Payer: Self-pay

## 2018-11-07 VITALS — BP 121/72 | HR 70 | Temp 97.9°F | Ht 66.0 in | Wt 207.0 lb

## 2018-11-07 DIAGNOSIS — R7303 Prediabetes: Secondary | ICD-10-CM | POA: Diagnosis not present

## 2018-11-07 DIAGNOSIS — E669 Obesity, unspecified: Secondary | ICD-10-CM | POA: Diagnosis not present

## 2018-11-07 DIAGNOSIS — Z6833 Body mass index (BMI) 33.0-33.9, adult: Secondary | ICD-10-CM | POA: Diagnosis not present

## 2018-11-09 NOTE — Progress Notes (Signed)
Office: [phone]  /  Fax: [phone]   HPI:   Chief Complaint: OBESITY Lindsey [name] is here to discuss her progress with her obesity treatment plan. She is keeping a food journal with 1500-1800 calories and 100 grams of protein and is following her eating plan approximately 75-80% of the time. She states she is walking 45 minutes 6 times per week. Lindsey [name]'s last in-office visit was 8 months ago and she has done well with weight loss during this time. She is journaling on and off and notes struggles with some hunger and cravings especially in the late afternoons. She is both happy she has lost weight and upset it isn't more. She admits to being obsessed with the number on the scale. Her weight is 207 lb (93.9 kg) today and has had a weight loss of 33 pounds over a period of 8 months since her last in-office visit. She has lost 118 lbs since starting treatment with [place].  Pre-Diabetes Lindsey [name] has a diagnosis of prediabetes based on her elevated Hgb A1c and was informed this puts her at greater risk of developing diabetes. She struggles with polyphagia in the afternoons. She has a lot of questions about metabolic rate, IR and visceral fat, brown vs. white, etc.  ASSESSMENT AND PLAN:  Prediabetes  Class 1 obesity with serious comorbidity and body mass index (BMI) of 33.0 to 33.9 in adult, unspecified obesity type  PLAN:  Pre-Diabetes Lindsey [name] will continue to work on weight loss, exercise, and decreasing simple carbohydrates in her diet to help decrease the risk of diabetes. We dicussed metformin including benefits and risks. She was informed that eating too many simple carbohydrates or too many calories at one sitting increases the likelihood of GI side effects. Lindsey [name] asked many questions and we answered them all. Greater than 40 minutes was spent on counseling and education.  I spent > than 50% of the 25 minute visit on counseling as documented in the note.  Obesity Lindsey [name] is currently in the action  stage of change. As such, her goal is to continue with weight loss efforts. She has agreed to keep a food journal with 1500-1800 calories and 100 grams of protein. We discussed the chances she will develop an eating disorder and the importance of concentrating on her health and not just what the scale shows. Lindsey [name] has been instructed to work up to a goal of 150 minutes of combined cardio and strengthening exercise per week for weight loss and overall health benefits. We discussed the following Behavioral Modification Strategies today: increasing lean protein intake, decreasing simple carbohydrates, and no skipping meals.  Lindsey [name] has agreed to follow-up with our clinic in 4-6 weeks. She was informed of the importance of frequent follow-up visits to maximize her success with intensive lifestyle modifications for her multiple health conditions.  ALLERGIES: Allergies  Allergen Reactions  . Lactose Intolerance (Gi) Other (See Comments)    Cramping  . Sulfa Antibiotics   . Amoxicillin Rash    MEDICATIONS: Current Outpatient Medications on File Prior to Visit  Medication Sig Dispense Refill  . Cholecalciferol (VITAMIN D3) 125 MCG (5000 UT) CAPS Take 1 capsule by mouth daily.    Marland Kitchen glucosamine-chondroitin 500-400 MG tablet Take 1 tablet by mouth 2 (two) times daily.    . Multiple Vitamins-Minerals (WOMENS MULTIVITAMIN PLUS PO) Take 1 tablet by mouth daily.    Marland Kitchen nystatin (MYCOSTATIN/NYSTOP) powder Apply topically 2 (two) times daily. 60 g 0   No current facility-administered medications on file prior to  visit.     PAST MEDICAL HISTORY: Past Medical History:  Diagnosis Date  . Dry skin   . H/O seasonal allergies   . Hair loss   . Knee pain   . Multiple stiff joints   . Nasal congestion   . Skin tag   . Snoring   . Urgency of urination     PAST SURGICAL HISTORY: Past Surgical History:  Procedure Laterality Date  . FOOT SURGERY    . TONSILLECTOMY      SOCIAL HISTORY: Social History    Tobacco Use  . Smoking status: Former Smoker    Packs/day: 1.00    Years: 15.00    Pack years: 15.00    Types: Cigarettes  . Smokeless tobacco: Never Used  . Tobacco comment: quit 10 years ago  Substance Use Topics  . Alcohol use: Yes    Comment: 1-2 drinks a month or less  . Drug use: No    FAMILY HISTORY: Family History  Problem Relation Age of Onset  . High blood pressure Mother   . Depression Mother   . Obesity Mother   . Diabetes Father   . Heart disease Father   . Cancer Father   . Sleep apnea Father   . Obesity Father    ROS: Review of Systems  Endo/Heme/Allergies:       Positive for afternoon polyphagia.   PHYSICAL EXAM: Blood pressure 121/72, pulse 70, temperature 97.9 F (36.6 C), temperature source Oral, height 5\' 6"  (1.676 m), weight 207 lb (93.9 kg), SpO2 98 %. Body mass index is 33.41 kg/m. Physical Exam Vitals signs reviewed.  Constitutional:      Appearance: Normal appearance. She is obese.  Cardiovascular:     Rate and Rhythm: Normal rate.     Pulses: Normal pulses.  Pulmonary:     Effort: Pulmonary effort is normal.     Breath sounds: Normal breath sounds.  Musculoskeletal: Normal range of motion.  Skin:    General: Skin is warm and dry.  Neurological:     Mental Status: She is alert and oriented to person, place, and time.  Psychiatric:        Behavior: Behavior normal.   RECENT LABS AND TESTS: BMET    Component Value Date/Time   NA 141 03/09/2018 0922   K 4.7 03/09/2018 0922   CL 102 03/09/2018 0922   CO2 26 03/09/2018 0922   GLUCOSE 92 03/09/2018 0922   BUN 18 03/09/2018 0922   CREATININE 0.75 03/09/2018 0922   CALCIUM 9.7 03/09/2018 0922   GFRNONAA 86 03/09/2018 0922   GFRAA 99 03/09/2018 0922   Lab Results  Component Value Date   HGBA1C 5.4 03/09/2018   HGBA1C 5.5 10/12/2017   HGBA1C 5.7 (H) 05/25/2017   Lab Results  Component Value Date   INSULIN 10.0 03/09/2018   INSULIN 19.1 10/12/2017   INSULIN 44.2 (H)  05/25/2017   CBC    Component Value Date/Time   WBC 6.0 05/25/2017 1147   RBC 4.46 05/25/2017 1147   HGB 13.6 05/25/2017 1147   HCT 40.7 05/25/2017 1147   MCV 91 05/25/2017 1147   MCH 30.5 05/25/2017 1147   MCHC 33.4 05/25/2017 1147   RDW 13.9 05/25/2017 1147   LYMPHSABS 1.8 05/25/2017 1147   EOSABS 0.2 05/25/2017 1147   BASOSABS 0.0 05/25/2017 1147   Iron/TIBC/Ferritin/ %Sat No results found for: IRON, TIBC, FERRITIN, IRONPCTSAT Lipid Panel     Component Value Date/Time   CHOL 162 03/09/2018 0922  TRIG 61 03/09/2018 0922   HDL 65 03/09/2018 0922   LDLCALC 85 03/09/2018 0922   Hepatic Function Panel     Component Value Date/Time   PROT 6.6 03/09/2018 0922   ALBUMIN 4.4 03/09/2018 0922   AST 17 03/09/2018 0922   ALT 20 03/09/2018 0922   ALKPHOS 79 03/09/2018 0922   BILITOT 0.8 03/09/2018 0922      Component Value Date/Time   TSH 2.070 05/25/2017 1147   Results for Georg RuddleHORNSBY, Kelda E (MRN [id number]) as of 11/09/2018 07:56  Ref. Range 03/09/2018 09:22  Vitamin D, 25-Hydroxy Latest Ref Range: 30.0 - 100.0 ng/mL 43.0   OBESITY BEHAVIORAL INTERVENTION VISIT  Today's visit was #16  Starting weight: 325 lbs Starting date: 05/25/2017 Today's weight: 207 lbs Today's date: 11/07/2018 Total lbs lost to date: 118    11/07/2018  Height 5\' 6"  (1.676 m)  Weight 207 lb (93.9 kg)  BMI (Calculated) 33.43  BLOOD PRESSURE - SYSTOLIC 121  BLOOD PRESSURE - DIASTOLIC 72   Body Fat % 46.2 %   ASK: We discussed the diagnosis of obesity with Georg RuddleJane E [name] today and Lindsey SquibbJane agreed to give us permission to discuss obesity behavioral modification therapy today.  ASSESS: Lindsey SquibbJane has the diagnosis of obesity and her BMI today is 33.5. Lindsey SquibbJane is in the action stage of change.   ADVISE: Lindsey SquibbJane was educated on the multiple health risks of obesity as well as the benefit of weight loss to improve her health. She was advised of the need for long term treatment and the importance of lifestyle  modifications to improve her current health and to decrease her risk of future health problems.  AGREE: Multiple dietary modification options and treatment options were discussed and  Lindsey SquibbJane agreed to follow the recommendations documented in the above note.  ARRANGE: Lindsey SquibbJane was educated on the importance of frequent visits to treat obesity as outlined per CMS and USPSTF guidelines and agreed to schedule her next follow up appointment today.  I, Marianna Paymentenise Haag, am acting as Energy managertranscriptionist for Quillian Quincearen Beasley, MD I have reviewed the above documentation for accuracy and completeness, and I agree with the above. -Quillian Quincearen Beasley, MD

## 2018-11-15 ENCOUNTER — Other Ambulatory Visit: Payer: Self-pay | Admitting: Radiology

## 2018-11-21 ENCOUNTER — Other Ambulatory Visit: Payer: Self-pay

## 2018-11-21 ENCOUNTER — Encounter: Payer: Self-pay | Admitting: *Deleted

## 2018-11-21 ENCOUNTER — Telehealth: Payer: Self-pay | Admitting: *Deleted

## 2018-11-21 ENCOUNTER — Inpatient Hospital Stay: Payer: BC Managed Care – PPO | Attending: Hematology and Oncology | Admitting: Hematology and Oncology

## 2018-11-21 DIAGNOSIS — D0512 Intraductal carcinoma in situ of left breast: Secondary | ICD-10-CM | POA: Diagnosis not present

## 2018-11-21 DIAGNOSIS — Z170 Estrogen receptor positive status [ER+]: Secondary | ICD-10-CM

## 2018-11-21 DIAGNOSIS — Z87891 Personal history of nicotine dependence: Secondary | ICD-10-CM | POA: Insufficient documentation

## 2018-11-21 NOTE — Research (Signed)
AFT-25 COMET Clinical Trial initial consent visit  Met with patient after receiving a referral for the AFT-25 COMET research study from Dr. Lindi Adie. Patient was given a copy of the Comet Informed Consent Form, Version 5, dated 07/31/18, along with the newly released COMET Patient Brochure and Patient Card. Patient was also given a copy of the [place] brochure and my business card. Patient is aware that this study involves a randomization between surgical treatment ("Operation") or Monitoring where her condition would be watched closely in lieu of having surgery up front. Explained that patient should be comfortable with being randomized to either group, however, she is aware that study participation is voluntary and that she is free to withdraw at any time. Patient is encouraged to continue follow-up participation in the event she is randomized and changes her mind regarding her assigned group. Patient states that she is scheduled to see Dr. Brantley Stage on Friday 11/24/2018 and requests a call from a research nurse on Tuesday 11/28/2018 for follow-up. Encouraged patient to read the consent form carefully and to make note of any questions she might have, prior to her appointment with the surgeon on Friday. Patient states that the best way to reach her is via cell phone [phone]. She is aware that research staff will be reviewing her records to confirm eligibility for the trial.  Cindy S. Brigitte Pulse BSN, RN, [place] 11/21/2018 3:40 PM

## 2018-11-21 NOTE — Assessment & Plan Note (Signed)
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 It is considered a precancerous lesion. DCIS is classified as a 0. It is generally detected through mammograms as calcifications. We discussed the significance of grades and its impact on prognosis. We also discussed the importance of ER and PR receptors and their implications to adjuvant treatment options. Prognosis of DCIS dependence on grade, comedo necrosis. It is anticipated that if not treated, 20-30% of DCIS can develop into invasive breast cancer.  Recommendation: 1. Breast conserving surgery versus participating in Comet clinical trial 2. Followed by adjuvant radiation therapy if she undergoes breast conserving surgery 3. Followed by antiestrogen therapy with tamoxifen 5 years  Tamoxifen counseling: We discussed the risks and benefits of tamoxifen. These include but not limited to insomnia, hot flashes, mood changes, vaginal dryness, and weight gain. Although rare, serious side effects including endometrial cancer, risk of blood clots were also discussed. We strongly believe that the benefits far outweigh the risks. Patient understands these risks and consented to starting treatment. Planned treatment duration is 5 years.  AFT 25 COMET Phase 3 clinical trial for low risk DCIS grade 1/2 PR positive, age greater than 40 randomized to surgery +/- radiation, +/- endocrine therapy versus active surveillance with +/- endocrine therapy surveillance with mammograms every 6 months for 5 years;patient's have option to decline elevated arm and still be followed on study   Patient is interested in participating in the clinical trial.  I will inform the research staff.

## 2018-11-21 NOTE — Telephone Encounter (Signed)
Called and spoke with patient regarding an appointment for today at 1:15pm.  Had a lengthy discussion with her about her diagnosis.  She had lots of questions. Gave her contact information and navigation resources.

## 2018-11-21 NOTE — Progress Notes (Signed)
[facility] CONSULT NOTE  Patient Care Team: Aliene Beams, MD as PCP - General (Family Medicine)  CHIEF COMPLAINTS/PURPOSE OF CONSULTATION:  Newly diagnosed left breast DCIS  HISTORY OF PRESENTING ILLNESS:  Lindsey [name] 63 y.o. female is here because of recent diagnosis of left breast DCIS.  Patient had routine screening mammogram that detected calcifications in the left breast.  This was biopsied and was found to have low-grade DCIS that was ER PR positive.  She is going to be seen by Dr. Luisa Hart and she is seeing me today to discuss the pathology report.  She was informed about the comet clinical trial.  She is very interested in that.  She is not 100% certain about what she wants to do.  She would be willing to participate in the clinical trial and see what the randomization decision is.  I reviewed her records extensively and collaborated the history with the patient.  SUMMARY OF ONCOLOGIC HISTORY: Oncology History   No history exists.     MEDICAL HISTORY:  Past Medical History:  Diagnosis Date  . Dry skin   . H/O seasonal allergies   . Hair loss   . Knee pain   . Multiple stiff joints   . Nasal congestion   . Skin tag   . Snoring   . Urgency of urination     SURGICAL HISTORY: Past Surgical History:  Procedure Laterality Date  . FOOT SURGERY    . TONSILLECTOMY      SOCIAL HISTORY: Social History   Socioeconomic History  . Marital status: Single    Spouse name: Not on file  . Number of children: 0  . Years of education: Not on file  . Highest education level: Not on file  Occupational History  . Occupation: [employer]  Social Needs  . Financial resource strain: Not on file  . Food insecurity    Worry: Not on file    Inability: Not on file  . Transportation needs    Medical: Not on file    Non-medical: Not on file  Tobacco Use  . Smoking status: Former Smoker    Packs/day: 1.00    Years: 15.00    Pack years: 15.00    Types:  Cigarettes  . Smokeless tobacco: Never Used  . Tobacco comment: quit 10 years ago  Substance and Sexual Activity  . Alcohol use: Yes    Comment: 1-2 drinks a month or less  . Drug use: No  . Sexual activity: Not on file  Lifestyle  . Physical activity    Days per week: Not on file    Minutes per session: Not on file  . Stress: Not on file  Relationships  . Social Musician on phone: Not on file    Gets together: Not on file    Attends religious service: Not on file    Active member of club or organization: Not on file    Attends meetings of clubs or organizations: Not on file    Relationship status: Not on file  . Intimate partner violence    Fear of current or ex partner: Not on file    Emotionally abused: Not on file    Physically abused: Not on file    Forced sexual activity: Not on file  Other Topics Concern  . Not on file  Social History Narrative  . Not on file    FAMILY HISTORY: Family History  Problem Relation Age  of Onset  . High blood pressure Mother   . Depression Mother   . Obesity Mother   . Diabetes Father   . Heart disease Father   . Cancer Father   . Sleep apnea Father   . Obesity Father     ALLERGIES:  is allergic to lactose intolerance (gi); sulfa antibiotics; and amoxicillin.  MEDICATIONS:  Current Outpatient Medications  Medication Sig Dispense Refill  . Cholecalciferol (VITAMIN D3) 125 MCG (5000 UT) CAPS Take 1 capsule by mouth daily.    Marland Kitchen glucosamine-chondroitin 500-400 MG tablet Take 1 tablet by mouth 2 (two) times daily.    . Multiple Vitamins-Minerals (WOMENS MULTIVITAMIN PLUS PO) Take 1 tablet by mouth daily.    Marland Kitchen nystatin (MYCOSTATIN/NYSTOP) powder Apply topically 2 (two) times daily. 60 g 0   No current facility-administered medications for this visit.     REVIEW OF SYSTEMS:   Constitutional: Denies fevers, chills or abnormal night sweats Eyes: Denies blurriness of vision, double vision or watery eyes Ears, nose,  mouth, throat, and face: Denies mucositis or sore throat Respiratory: Denies cough, dyspnea or wheezes Cardiovascular: Denies palpitation, chest discomfort or lower extremity swelling Gastrointestinal:  Denies nausea, heartburn or change in bowel habits Skin: Denies abnormal skin rashes Lymphatics: Denies new lymphadenopathy or easy bruising Neurological:Denies numbness, tingling or new weaknesses Behavioral/Psych: Mood is stable, no new changes  Breast:  Denies any palpable lumps or discharge All other systems were reviewed with the patient and are negative.  PHYSICAL EXAMINATION: ECOG PERFORMANCE STATUS: 1 - Symptomatic but completely ambulatory  Vitals:   11/21/18 1331  BP: 121/71  Pulse: 76  Resp: 17  Temp: 98.9 F (37.2 C)  SpO2: 98%   Filed Weights   11/21/18 1331  Weight: 212 lb 8 oz (96.4 kg)    GENERAL:alert, no distress and comfortable SKIN: skin color, texture, turgor are normal, no rashes or significant lesions EYES: normal, conjunctiva are pink and non-injected, sclera clear OROPHARYNX:no exudate, no erythema and lips, buccal mucosa, and tongue normal  NECK: supple, thyroid normal size, non-tender, without nodularity LYMPH:  no palpable lymphadenopathy in the cervical, axillary or inguinal LUNGS: clear to auscultation and percussion with normal breathing effort HEART: regular rate & rhythm and no murmurs and no lower extremity edema ABDOMEN:abdomen soft, non-tender and normal bowel sounds Musculoskeletal:no cyanosis of digits and no clubbing  PSYCH: alert & oriented x 3 with fluent speech NEURO: no focal motor/sensory deficits BREAST: No palpable nodules in breast. No palpable axillary or supraclavicular lymphadenopathy (exam performed in the presence of a chaperone)   LABORATORY DATA:  I have reviewed the data as listed Lab Results  Component Value Date   WBC 6.0 05/25/2017   HGB 13.6 05/25/2017   HCT 40.7 05/25/2017   MCV 91 05/25/2017   Lab Results   Component Value Date   NA 141 03/09/2018   K 4.7 03/09/2018   CL 102 03/09/2018   CO2 26 03/09/2018    RADIOGRAPHIC STUDIES: I have personally reviewed the radiological reports and agreed with the findings in the report.  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 It is considered a precancerous lesion. DCIS is classified as a 0. It is generally detected through mammograms as calcifications. We discussed the significance of grades and its impact on  prognosis. We also discussed the importance of ER and PR receptors and their implications to adjuvant treatment options. Prognosis of DCIS dependence on grade, comedo necrosis. It is anticipated that if not treated, 20-30% of DCIS can develop into invasive breast cancer.  Recommendation: 1. Breast conserving surgery versus participating in Comet clinical trial 2. Followed by adjuvant radiation therapy if she undergoes breast conserving surgery 3. Followed by antiestrogen therapy with tamoxifen 5 years  Tamoxifen counseling: We discussed the risks and benefits of tamoxifen. These include but not limited to insomnia, hot flashes, mood changes, vaginal dryness, and weight gain. Although rare, serious side effects including endometrial cancer, risk of blood clots were also discussed. We strongly believe that the benefits far outweigh the risks. Patient understands these risks and consented to starting treatment. Planned treatment duration is 5 years.  AFT 25 COMET Phase 3 clinical trial for low risk DCIS grade 1/2 PR positive, age greater than 40 randomized to surgery +/- radiation, +/- endocrine therapy versus active surveillance with +/- endocrine therapy surveillance with mammograms every 6 months for 5  years;patient's have option to decline elevated arm and still be followed on study   Patient is interested in participating in the clinical trial.  I will inform the research staff.   Return to clinic based upon clinical trial participation.  All questions were answered. The patient knows to call the clinic with any problems, questions or concerns.    Tamsen MeekViinay K Zhaniya Swallows, MD 11/21/18

## 2018-11-22 ENCOUNTER — Ambulatory Visit: Payer: BC Managed Care – PPO | Admitting: Hematology and Oncology

## 2018-11-28 ENCOUNTER — Telehealth: Payer: Self-pay | Admitting: Medical Oncology

## 2018-11-28 NOTE — Telephone Encounter (Signed)
COMET: Referral Follow-up call to patient regarding her referral, by Dr. Lindi Adie, last week for COMET. I called patient to introduce myself and to inquire whether she had any questions about the study or the information provided to her by research nurse, Tyrell Antonio at patient's 9/1st office visit . Patient states she did not have any questions at this time and was not able to talk at the time of this call. Patient states she will call me by the end of the week or sometime next week, regarding her interest in study. Patient was thanked for her time and interest in study.  Maxwell Marion, RN, BSN, [facility] Clinical Research 11/28/2018 1:07 PM

## 2018-12-05 ENCOUNTER — Telehealth: Payer: Self-pay | Admitting: Medical Oncology

## 2018-12-05 NOTE — Telephone Encounter (Addendum)
COMET: Referral Spoke with patient regarding study. Patient was provided a consent form for review by Tyrell Antonio, on September 1st for her review. I inquired with patient if she had any questions. Patient stated she does not remember receiving anything regarding the study to review. I gave patient a brief overview of the study, including the assessments and questionnaires. Patient inquired as to charges related to the study, I informed patient that the study does not pay for study visits or procedures,  that the study is randomized to either standard of care or active surveillance. Patient knows participation in study is voluntary. Patient was provided with information on randomization process and I inquired as to whether she was comfortable to being randomized to either arm accepting of the result and patient stated that she is. Patient expressed interest in participating and stated she could come in for consent review and signing. I informed the patient that this visit would take approximately an hour, should be interested in participating, with signing of consent and baseline questionnaires. Patient gave her verbal understanding. Patient stated she was available to come in on October 1st. Consent appointment set for October 1st at 2:30. I informed patient that I will e-mail her a copy of the the study consent for review, that this copy is just for review and not for her to sign. Patients e-mail confirmed. Patient thanked for her time and interest in study and was encouraged to call Dr. Lindi Adie or myself with questions.  Maxwell Marion, RN, BSN, [facility] Clinical Research 12/05/2018 4:50 PM

## 2018-12-11 ENCOUNTER — Encounter (INDEPENDENT_AMBULATORY_CARE_PROVIDER_SITE_OTHER): Payer: Self-pay

## 2018-12-12 ENCOUNTER — Other Ambulatory Visit: Payer: Self-pay

## 2018-12-12 ENCOUNTER — Encounter (INDEPENDENT_AMBULATORY_CARE_PROVIDER_SITE_OTHER): Payer: Self-pay | Admitting: Family Medicine

## 2018-12-12 ENCOUNTER — Ambulatory Visit (INDEPENDENT_AMBULATORY_CARE_PROVIDER_SITE_OTHER): Payer: BC Managed Care – PPO | Admitting: Family Medicine

## 2018-12-12 VITALS — BP 97/61 | HR 71 | Temp 97.9°F | Ht 66.0 in | Wt 203.0 lb

## 2018-12-12 DIAGNOSIS — Z6832 Body mass index (BMI) 32.0-32.9, adult: Secondary | ICD-10-CM

## 2018-12-12 DIAGNOSIS — F3289 Other specified depressive episodes: Secondary | ICD-10-CM

## 2018-12-12 DIAGNOSIS — R7303 Prediabetes: Secondary | ICD-10-CM

## 2018-12-12 DIAGNOSIS — E669 Obesity, unspecified: Secondary | ICD-10-CM

## 2018-12-13 NOTE — Progress Notes (Signed)
Office: [phone]  /  Fax: [phone]   HPI:   Chief Complaint: OBESITY Lindsey [name] is here to discuss her progress with her obesity treatment plan. She is keeping a food journal with 1500-1800 calories and 100 grams of protein and is following her eating plan approximately 85-90% of the time. She states she is walking 50-60 minutes 6 times per week. Lindsey [name] has done very well with weight loss, but is always disappointed that she didn't lose more weight. She states she is struggling with hunger and then tends to binge eat. Her weight is 203 lb (92.1 kg) today and has had a weight loss of 4 pounds over a period of 5 weeks since her last visit. She has lost 122 lbs since starting treatment with [place].  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 She describes some binge eating episodes and this is distressing to her. She has an obsessive relationship with food and weight. She shows no sign of suicidal or homicidal ideations.  Depression screen [facility] 2/9 12/28/2017 11/30/2017 11/16/2017 11/02/2017 05/25/2017  Decreased Interest 0 1 0 0 1  Down, Depressed, Hopeless 1 1 0 0 2  PHQ - 2 Score 1 2 0 0 3  Altered sleeping 0 1 0 0 0  Tired, decreased energy 0 0 1 0 3  Change in appetite 1 0 0 1 1  Feeling bad or failure about yourself  1 1 0 1 2  Trouble concentrating 0 0 0 1 2  Moving slowly or fidgety/restless 0 0 1 0 3  Suicidal thoughts 0 0 0 0 1  PHQ-9 Score 3 4 2 3 15   Difficult doing work/chores - - - - Somewhat difficult   Pre-Diabetes Lindsey [name] has a diagnosis of prediabetes based on her elevated Hgb A1c and was informed this puts her at greater risk of developing diabetes. She is working on diet and exercise to help prevent diabetes mellitus. She does report polyphagia but wants to avoid metformin.  ASSESSMENT AND PLAN:  Prediabetes  Other depression  Class 1 obesity with serious comorbidity and body mass index (BMI) of 32.0 to 32.9 in adult,  unspecified obesity type  PLAN:  Depression, Other  We discussed behavior modification techniques today to help Lindsey [name] deal with her emotional eating and depression. Lindsey [name] is referred back to Dr. Mallie Mussel, our bariatric psychologist, for evaluation.  Pre-Diabetes Lindsey [name] will continue to work on weight loss, exercise, and decreasing simple carbohydrates in her diet to help decrease the risk of diabetes. We dicussed metformin including benefits and risks. She was informed that eating too many simple carbohydrates or too many calories at one sitting increases the likelihood of GI side effects. Lindsey [name] will continue diet and exercise and follow-up with [place] as directed to monitor her progress.  I spent > than 50% of the 25 minute visit on counseling as documented in the note.  Obesity Lindsey [name] is currently in the action stage of change. As such, her goal is to continue with weight loss efforts.  She has agreed to keep a food journal with 1500-1800 calories and 100 grams of protein.  Lindsey [name] has been instructed to work up to a goal of 150 minutes of combined cardio and strengthening exercise per week for weight loss and overall health benefits. We discussed the following Behavioral Modification Strategies today: keeping healthy foods in the home and emotional eating strategies.  Lindsey [name] will start Contrave 2 PO BID #120 with 0 refills and slowly titrate  up. She will follow-up as directed to monitor her progress.  Lindsey [name] has agreed to follow-up with our clinic in 5-6 weeks. She was informed of the importance of frequent follow-up visits to maximize her success with intensive lifestyle modifications for her multiple health conditions.  ALLERGIES: Allergies  Allergen Reactions  . Lactose Intolerance (Gi) Other (See Comments)    Cramping  . Sulfa Antibiotics   . Amoxicillin Rash    MEDICATIONS: Current Outpatient Medications on File Prior to Visit  Medication Sig Dispense Refill  . Cholecalciferol (VITAMIN D3) 125  MCG (5000 UT) CAPS Take 1 capsule by mouth daily.    Lindsey [name] Kitchen glucosamine-chondroitin 500-400 MG tablet Take 1 tablet by mouth 2 (two) times daily.    . Multiple Vitamins-Minerals (WOMENS MULTIVITAMIN PLUS PO) Take 1 tablet by mouth daily.    . Turmeric 500 MG CAPS Take 1 capsule by mouth daily.     No current facility-administered medications on file prior to visit.     PAST MEDICAL HISTORY: Past Medical History:  Diagnosis Date  . Dry skin   . H/O seasonal allergies   . Hair loss   . Knee pain   . Multiple stiff joints   . Nasal congestion   . Skin tag   . Snoring   . Urgency of urination     PAST SURGICAL HISTORY: Past Surgical History:  Procedure Laterality Date  . FOOT SURGERY    . TONSILLECTOMY      SOCIAL HISTORY: Social History   Tobacco Use  . Smoking status: Former Smoker    Packs/day: 1.00    Years: 15.00    Pack years: 15.00    Types: Cigarettes  . Smokeless tobacco: Never Used  . Tobacco comment: quit 10 years ago  Substance Use Topics  . Alcohol use: Yes    Comment: 1-2 drinks a month or less  . Drug use: No    FAMILY HISTORY: Family History  Problem Relation Age of Onset  . High blood pressure Mother   . Depression Mother   . Obesity Mother   . Diabetes Father   . Heart disease Father   . Cancer Father   . Sleep apnea Father   . Obesity Father    ROS: Review of Systems  Endo/Heme/Allergies:       Positive for polyphagia.  Psychiatric/Behavioral: Positive for depression. Negative for suicidal ideas.       Negative for homicidal ideas.   PHYSICAL EXAM: Blood pressure 97/61, pulse 71, temperature 97.9 F (36.6 C), temperature source Oral, height 5\' 6"  (1.676 m), weight 203 lb (92.1 kg), SpO2 97 %. Body mass index is 32.77 kg/m. Physical Exam Vitals signs reviewed.  Constitutional:      Appearance: Normal appearance. She is obese.  Cardiovascular:     Rate and Rhythm: Normal rate.     Pulses: Normal pulses.  Pulmonary:     Effort:  Pulmonary effort is normal.     Breath sounds: Normal breath sounds.  Musculoskeletal: Normal range of motion.  Skin:    General: Skin is warm and dry.  Neurological:     Mental Status: She is alert and oriented to person, place, and time.  Psychiatric:        Behavior: Behavior normal.   RECENT LABS AND TESTS: BMET    Component Value Date/Time   NA 141 03/09/2018 0922   K 4.7 03/09/2018 0922   CL 102 03/09/2018 0922   CO2 26 03/09/2018 0922   GLUCOSE 92  03/09/2018 0922   BUN 18 03/09/2018 0922   CREATININE 0.75 03/09/2018 0922   CALCIUM 9.7 03/09/2018 0922   GFRNONAA 86 03/09/2018 0922   GFRAA 99 03/09/2018 0922   Lab Results  Component Value Date   HGBA1C 5.4 03/09/2018   HGBA1C 5.5 10/12/2017   HGBA1C 5.7 (H) 05/25/2017   Lab Results  Component Value Date   INSULIN 10.0 03/09/2018   INSULIN 19.1 10/12/2017   INSULIN 44.2 (H) 05/25/2017   CBC    Component Value Date/Time   WBC 6.0 05/25/2017 1147   RBC 4.46 05/25/2017 1147   HGB 13.6 05/25/2017 1147   HCT 40.7 05/25/2017 1147   MCV 91 05/25/2017 1147   MCH 30.5 05/25/2017 1147   MCHC 33.4 05/25/2017 1147   RDW 13.9 05/25/2017 1147   LYMPHSABS 1.8 05/25/2017 1147   EOSABS 0.2 05/25/2017 1147   BASOSABS 0.0 05/25/2017 1147   Iron/TIBC/Ferritin/ %Sat No results found for: IRON, TIBC, FERRITIN, IRONPCTSAT Lipid Panel     Component Value Date/Time   CHOL 162 03/09/2018 0922   TRIG 61 03/09/2018 0922   HDL 65 03/09/2018 0922   LDLCALC 85 03/09/2018 0922   Hepatic Function Panel     Component Value Date/Time   PROT 6.6 03/09/2018 0922   ALBUMIN 4.4 03/09/2018 0922   AST 17 03/09/2018 0922   ALT 20 03/09/2018 0922   ALKPHOS 79 03/09/2018 0922   BILITOT 0.8 03/09/2018 0922      Component Value Date/Time   TSH 2.070 05/25/2017 1147   Results for Lindsey, [name] (MRN [id number]) as of 12/13/2018 15:39  Ref. Range 03/09/2018 09:22  Vitamin D, 25-Hydroxy Latest Ref Range: 30.0 - 100.0 ng/mL 43.0    OBESITY BEHAVIORAL INTERVENTION VISIT  Today's visit was #17   Starting weight: 325 lbs Starting date: 05/25/2017 Today's weight: 203 lbs  Today's date: 12/12/2018 Total lbs lost to date: 122    12/12/2018  Height 5\' 6"  (1.676 m)  Weight 203 lb (92.1 kg)  BMI (Calculated) 32.78  BLOOD PRESSURE - SYSTOLIC 97  BLOOD PRESSURE - DIASTOLIC 61   Body Fat % 45.2 %  Total Body Water (lbs) 90 lbs   ASK: We discussed the diagnosis of obesity with Lindsey [name] today and Lindsey [name] agreed to give [place] permission to discuss obesity behavioral modification therapy today.  ASSESS: Lindsey [name] has the diagnosis of obesity and her BMI today is 32.8. Lindsey [name] is in the action stage of change.   ADVISE: Lindsey [name] was educated on the multiple health risks of obesity as well as the benefit of weight loss to improve her health. She was advised of the need for long term treatment and the importance of lifestyle modifications to improve her current health and to decrease her risk of future health problems.  AGREE: Multiple dietary modification options and treatment options were discussed and  Lindsey [name] agreed to follow the recommendations documented in the above note.  ARRANGE: Lindsey [name] was educated on the importance of frequent visits to treat obesity as outlined per CMS and USPSTF guidelines and agreed to schedule her next follow up appointment today.  I, Marianna Payment, am acting as Energy manager for Quillian Quince, MD I have reviewed the above documentation for accuracy and completeness, and I agree with the above. -Quillian Quince, MD

## 2018-12-14 MED ORDER — CONTRAVE 8-90 MG PO TB12: 8.0000 mg | ORAL_TABLET | ORAL | 0 refills | Status: DC

## 2018-12-20 ENCOUNTER — Telehealth: Payer: Self-pay | Admitting: Medical Oncology

## 2018-12-20 NOTE — Telephone Encounter (Signed)
COMET: Consent appt. I spoke with patient this afternoon and confirmed her appointment with me tomorrow at 2:30. Patient was asked to arrive 15 minutes early to allow for check-in time. Patient denies having any questions at this time. Patient provided with information as to where to arrive for appointment. Patient thanked for her time.

## 2018-12-21 ENCOUNTER — Inpatient Hospital Stay: Payer: BC Managed Care – PPO | Attending: Hematology and Oncology | Admitting: Medical Oncology

## 2018-12-21 ENCOUNTER — Encounter: Payer: Self-pay | Admitting: Medical Oncology

## 2018-12-21 DIAGNOSIS — D0512 Intraductal carcinoma in situ of left breast: Secondary | ICD-10-CM

## 2018-12-21 NOTE — Research (Signed)
COMET: Consent I met with patient, here alone, for reviewing and signing of study consent form. I inquired with patient as to whether she had an opportunity to review the consent form that was emailed to her and patient stated she read over it, except for the last three pages. I inquired with patient as to whether she had any questions I could answer for her before we get started. Patient informed me that she read in the consent form that should be randomized to the standard of care arm, she would need to have surgery within 60 days from randomization. Patient states she was hoping not to have surgery until next year, in the new calendar year for insurance purposes. I gave patient my understanding. It was confirmed with study, that it was ok to consent the patient today and then wait till November to register her. Patient was informed that she will need to have a visit with Dr. Lindi Adie again for a history and physical, because it has to be within thirty days or less before registering her to the study, patient gave her verbal understanding to this. Once patient's questions were answered, patient and I proceeded to review the study consent form, with imbedded authorization, page by page. Patient confirms understanding that this is a randomized study and states she is comfortable to being randomized to either group; surgery or active monitoring. Patient understands the purpose of the study, that her participation is voluntary, the usual treatment for DCIS, length of study, what is expected of her, the questionnaires/survey's involved and schedule of subsequent study surveys, the study procedures with active monitoring and the surgical groups, blood and tissue samples involved, possible risks and or benefits to her participation, any costs that may be involved with her participation, how her medical information will be kept private, used/shared, where she can obtain more information about the study, and the optional  component of this study. After all of patient's questions were answered, patient proceeded to sign and date the consent form, where indicated. Patient provided consent for the Optional Component of the Study. Patient was thanked for her time and interest in study and was encouraged to call Dr. Lindi Adie or myself with any questions/concerns she may have. Patient was provided with my contact information.and was provided with a copy of her signed consent form for her records.  Patient completed the baseline questionnaires, after consent signing, today.  Dr. Lindi Adie informed of patient's wishes to not be randomized for study until mid-November.  Approximately one hour was spent with patient answering her questions and consenting her to study.  Maxwell Marion, RN, BSN, [facility] Clinical Research 12/21/2018 3:56 PM

## 2018-12-22 ENCOUNTER — Encounter: Payer: Self-pay | Admitting: *Deleted

## 2018-12-25 NOTE — Progress Notes (Signed)
Office: [phone]  /  Fax: [phone]    Date: December 26, 2018  Time Seen: 3:00pm Duration: 65 minutes Provider: Lawerance Cruel, PsyD Type of Session: Intake for Individual Therapy  Type of Contact: Face-to-face  Informed Consent for In-Person Services During COVID-19:  During today's appointment, information about the decision to initiate in-person services in light of the COVID-19 public health crisis was discussed. Lindsey [name] and this provider agreed to meet in person for some or all future appointments. If there is a resurgence of the pandemic or other health concerns arise, telepsychological services may be initiated and any related concerns will be discussed and an attempt to address them will be made. Lindsey [name] verbally acknowledged understanding that if necessary, this provider may determine there is a need to initiate telepsychological services for everyone's well-being. Lindsey [name] expressed understanding she may request to initiate telepsychological services, and that request will be respected as long as it is feasible and clinically appropriate. Regarding telepsychological services, Lindsey [name] acknowledged she is ultimately responsible for understanding her insurance benefits as it relates to reimbursement of telepsychological services. Moreover, the risks for opting for in-person services was discussed. Lindsey [name] verbally acknowledged understanding that by coming to the office, she is assuming the risk of exposure to the coronavirus or other public risk, and the risk may increase if Lindsey [name] travels by public transportation, cab, or [employer]. To obtain in-person services, Lindsey [name] verbally agreed to taking certain precautions (e.g., screening prior to appointment; universal masking; social distancing of 6 feet; proper hand hygiene; no visitors) set forth by [facility] to keep everyone safe from exposure, sickness, and possible death. This information was shared by front desk staff either at the time of  scheduling and/or during the check-in process. Lindsey [name] expressed understanding that should she not adhere to these safeguards, it may result in starting/returning to a telepsychological service arrangement and/or the exploration of other options for treatment. Lindsey [name] acknowledged understanding that [facility] will follow the protocol set forth by [facility] should a patient present with a fever or other symptoms or disclose recent exposure, which will include rescheduling the appointment. Furthermore, Lindsey [name] acknowledged understanding that precautions may change if additional local, state or federal orders or guidelines are published. This provider also shared that if Lindsey [name] tests positive for the coronavirus, this provider may be required to notify local health authorities that Lindsey [name] was in the [facility] clinic. Only minimum information necessary for data collection will be disclosed. This provider will follow [facility]'s disclosure policy should this provider or staff test positive for the coronavirus. To avoid handling of paper/writing instruments and increasing likelihood of touching, verbal consent was obtained by Erskine Squibb during today's appointment prior to proceeding. Lindsey [name] provided verbal consent to proceed, and acknowledged understanding that by verbally consenting to proceed, she is agreeable to all information noted above.   Informed Consent: The provider's role was explained to Lindsey [name]. The provider reviewed and discussed issues of confidentiality, privacy, and limits therein (e.g., reporting obligations). In addition to verbal informed consent, written informed consent for psychological services was obtained from Lindsey [name] prior to the initial intake interview. Written consent included information concerning the practice, financial arrangements, and confidentiality and patients' rights. Since the clinic is not a 24/7 crisis center, mental health emergency resources were shared via  a handout, and the provider explained MyChart, e-mail, voicemail, and/or other messaging systems should be utilized only for non-emergency reasons. This provider also explained that information obtained during appointments will be  placed in Zeba's medical record in a confidential manner and relevant information will be shared with other providers at [facility] that she meets with for coordination of care. Lindsey [name] verbally acknowledged understanding of the aforementioned, and agreed to use mental health emergency resources discussed if needed. Moreover, Lindsey [name] agreed information may be shared with other [facility] providers as needed for coordination of care. By signing the service agreement document, Lindsey [name] provided written consent for coordination of care. Of note, Lindsey [name] declined to provide an emergency contact on the service agreement.   Chief Complaint/HPI: Lindsey [name] was referred by Dr. Quillian Quince due to depression, other. Per the note for the visit with Dr. 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 She describes some binge eating episodes and this is distressing to her. She has an obsessive relationship with food and weight. She shows no sign of suicidal or homicidal ideations." During the initial appointment with Dr. Quillian Quince at [facility] on May 25, 2017, Lindsey [name] reported experiencing the following: significant food cravings issues , snacking frequently in the evenings, frequently drinking liquids with calories, frequently making poor food choices, frequently eating larger portions than normal , binge eating behaviors, struggling with emotional eating, skipping meals frequently and waking up frquently in the middle of the night to eat. Lindsey [name] was previously referred to this provider and per the note for the initial appointment with this provider on November 02, 2017, "Lindsey [name] was referred by Dr. Quillian Quince due to emotional eating . Per the note for the last visit with Dr. Dalbert [name] on October 12, 2017, "Lindsey [name] frustrated that she is still giving into temptations frequently, especially in social situations." Lindsey [name] shared she does not want to know about her weight loss at this time, as she has a history of "being obsessive" about the number. Lindsey [name] expressed concern about "falling into the same pattern." She described feeling like she is "waiting for the other shoe to drop." She noted, "I let failure depress me."  During today's appointment, Lindsey [name] shared she began experiencing an increase in hunger and cravings approximately three weeks ago. She shared she believes cravings are triggered by physical hunger resulting in "stupid decisions." In the past week, Lindsey [name] noted, "I went off the rails multiple days in a row." She also described engaging in all or nothing thinking. Darnesha further shared eating "twice as much" for several days. She was verbally administered a questionnaire assessing various behaviors related to emotional eating. Nalla endorsed the following: experience food cravings on a regular basis, eat certain foods when you are anxious, stressed, depressed, or your feelings are hurt and not worry about what you eat when you are in a good mood. She shared she craves carbohydrates. Lumen believes the onset of emotional eating was likely during her early adolescence and shared her mother would often focus on her weight. She described the current frequency of emotional eating as "every day." In addition, Tearah described eating larger portions and recalled recently consuming 6 donuts in one sitting. Moreover, Gaynelle was unable to identify what triggers emotional eating; however, she reported having company makes emotional eating better. During today's appointment, Tameya further reported concern about needing "a break" from her eating goals established with Dr. Dalbert [name] and also described feeling "freigtened" to lose a "part  of [her] identity" as she continues to lose weight. She also indicated focusing on the number on the scale resulting in  her setting goals for weight loss. Furthermore, Mariem endorsed other problems of concern. More specifically, she indicated she is unsure about which treatment option to pick as it relates to cancer.    Mental Status Examination:  Appearance: neat Behavior: cooperative Mood: euthymic Affect: mood congruent Speech: normal in rate, volume, and tone Eye Contact: appropriate Psychomotor Activity: appropriate Thought Process: linear, logical, and goal directed  Content/Perceptual Disturbances: denies suicidal and homicidal ideation, plan, and intent and no hallucinations, delusions, bizarre thinking or behavior reported or observed Orientation: time, person, place and purpose of appointment Cognition/Sensorium: memory, attention, language, and fund of knowledge intact  Insight: fair Judgment: fair  Family & Psychosocial History: Kikue reported she is not in a relationship. 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 Additionally, Tiki shared her highest level of education obtained is two bachelor's degrees. Currently, Zlata's social support system consists of her mother and dog. Moreover, Teale stated she resides with her dog.   Medical History:  Past Medical History:  Diagnosis Date   Dry skin    H/O seasonal allergies    Hair loss    Knee pain    Multiple stiff joints    Nasal congestion    Skin tag    Snoring    Urgency of urination    Past Surgical History:  Procedure Laterality Date   FOOT SURGERY     TONSILLECTOMY     Current Outpatient Medications on File Prior to Visit  Medication Sig Dispense Refill   Cholecalciferol (VITAMIN D3) 125 MCG (5000 UT) CAPS Take 1 capsule by mouth daily.     glucosamine-chondroitin 500-400 MG tablet Take 1 tablet by mouth 2 (two) times daily.     Multiple Vitamins-Minerals (WOMENS MULTIVITAMIN PLUS PO)  Take 1 tablet by mouth daily.     Naltrexone-buPROPion HCl ER (CONTRAVE) 8-90 MG TB12 Take 8-90 mg by mouth as directed. Take two tabs by mouth twice daily 120 tablet 0   Turmeric 500 MG CAPS Take 1 capsule by mouth daily.     No current facility-administered medications on file prior to visit.   Per this provider's note for the initial visit with Erskine Squibb on November 02, 2017, "Kelly denied a history of head injuries, and indicated she has "fainted lots of times." She described fainting due to becoming overheated and being "squimish" when it comes to blood." She indicated during today's appointment that she fell during the spring of this year after twisting her ankle on a gum ball from a tree resulting in bruising. She denied LOC and did not seek medical attention.   Mental Health History: Per this provider's note for the initial visit with Erskine Squibb on November 02, 2017, "Josselyn first received therapy as a teenager due to social anxiety. She indicated it was encouraged by her mother, but she was very "resistant." She attended therapy in college when she was "struggling academically." Malayah reported she was in therapy on and off during the 1990s. The last time she was in therapy was before 1995, which included individual and group therapy. Shahara denied hospitalization for psychiatric concerns and has never seen a psychiatrist; however, she indicated she was previously prescribed Paxil. She could not recall who prescribed it. Kidada reported her mother "struggles with depression." Kamelia denied a history of trauma, including sexual, physical, and psychological abuse, as well as neglect. " During today's appointment, Elzora denied receiving therapeutic services since the last appointment with this provider. In addition, Skya denied hospitalization for psychiatric concerns and  meeting with a psychiatrist since the last appointment with this provider. Arriah noted she was prescribed Contrave by Dr. Leafy Ro, but she has not started the  medication. Lashandra continues to deny a history of trauma, including sexual, physical, and psychological abuse, as well as neglect.   Aside from concerns noted above and endorsed on the PHQ-9 and GAD-7, Dariah reported experiencing worry thoughts about her health, mother's well-being, and the well-being of her niece's baby, as the baby is not doing well. She also reported worry about having "less opportunities" as she gets older. Since the last appointment with this provider, Sedona reported a decrease in anxiety about finances. Aylene denied current alcohol use. She denied tobacco use. She denied illicit/recreational substance use. Regarding caffeine intake, Cortny reported consuming approximately 4 cups of tea daily and coffee occassionally. She believes there has been in increase in caffeine intake recently. Furthermore, Nabilah denied experiencing the following: hopelessness, hallucinations and delusions, paranoia, symptoms of mania (e.g., expansive mood, flighty ideas, decreased need for sleep, engagement in risky behaviors), crying spells, panic attacks and decreased motivation. She also denied current suicidal ideation, plan, and intent; history of and current homicidal ideation, plan, and intent; and history of and current engagement in self-harm. Notably, Eulah endorsed item 9 (i.e., "Do you feel that your weight problem is so hopeless that sometimes life doesn't seem worth living?") on the modified PHQ-9 during her initial appointment with Dr. Dennard Nip on May 25, 2017. She noted, "I don't feel that way" and explained that if she did endorse that item in the past it was due to hopelessness about weight and not due to suicidal ideation.  Per this provider's note for the initial visit with Opal Sidles on November 02, 2017, "Regarding suicidal ideation, Gwenyth indicated experiencing ideation, but added, "If I did it, I would be dead and would not experience any benefit from it." She denied experiencing plan and intent. Makena  indicated she last experienced ideation "couple months ago." She clarified, it is in th[e] form of "If I was dead, I wouldn't have to worry any more." Marylee denied current suicidal ideation, plan, and intent. Donalyn further added, "I'm far too self-absorbed to do anything."  She identified the following coping skills that could be utilized when feeling overwhelmed: go to sleep, read, watch TV, pet her dog, and find ways to distract herself. Shaquia's confidence in utilizing emergency resources provided should thoughts intensify was assessed on a scale of one to ten, where one is not confident and ten is extremely confident. She reported her confidence as a 10." During today's appointment, Skyelyn denied experiencing suicidal ideation, plan, and intent since the aforementioned. She continues to express confidence in her ability to stay safe and utilize shared emergency resources. She added, "It's never gotten that bad."  The following strengths were reported by Opal Sidles: clever, perceptive, sense of humor, wisdom, and experience. The following strengths were observed by this provider: ability to express thoughts and feelings during the therapeutic session, ability to establish and benefit from a therapeutic relationship, ability to learn and practice coping skills, willingness to work toward established goal(s) with the clinic and ability to engage in reciprocal conversation.  Legal History: Halle denied a history of legal involvement.   Structured Assessment Results: The Patient Health Questionnaire-9 (PHQ-9) is a self-report measure that assesses symptoms and severity of depression over the course of the last two weeks. Henryetta obtained a score of 2 suggesting minimal depression. Ugochi finds the endorsed symptoms to be not difficult  at all. Little interest or pleasure in doing things 0  Feeling down, depressed, or hopeless 0  Trouble falling or staying asleep, or sleeping too much 0  Feeling tired or having little energy 0   Poor appetite or overeating 2  Feeling bad about yourself --- or that you are a failure or have let yourself or your family down 0  Trouble concentrating on things, such as reading the newspaper or watching television 0  Moving or speaking so slowly that other people could have noticed? Or the opposite --- being so fidgety or restless that you have been moving around a lot more than usual 0  Thoughts that you would be better off dead or hurting yourself in some way 0  PHQ-9 Score 2    The Generalized Anxiety Disorder-7 (GAD-7) is a brief self-report measure that assesses symptoms of anxiety over the course of the last two weeks. Erskine SquibbJane obtained a score of 0. Feeling nervous, anxious, on edge 0  Not being able to stop or control worrying 0  Worrying too much about different things 0  Trouble relaxing 0  Being so restless that it's hard to sit still 0  Becoming easily annoyed or irritable 0  Feeling afraid as if something awful might happen 0  GAD-7 Score 0   Interventions: A chart review was conducted prior to the clinical intake interview. The PHQ-9 and GAD-7 were verbally administered and a clinical intake interview was completed. In addition, Erskine SquibbJane was verbally administered a Mood and Food questionnaire again to assess various behaviors related to emotional eating. Throughout session, empathic reflections and validation was provided. Continuing treatment with this provider was discussed and a treatment goal was established. This provider recommended focusing on increasing water intake as Erskine SquibbJane noted a recent reduction. Moreover, psychoeducation regarding emotional versus physical hunger was provided again. Erskine SquibbJane was given a handout to utilize between now and the next appointment to increase awareness of hunger patterns and subsequent eating.   Provisional DSM-5 Diagnosis: 300.09 (F41.8) Other Specified Anxiety Disorder, Generalized anxiety not occurring more days than not and emotional eating  behaviors  Plan: Erskine SquibbJane appears able and willing to participate as evidenced by collaboration on a treatment goal, engagement in reciprocal conversation, and asking questions as needed for clarification. Based on Shelanda's request of a late afternoon in-person appointment and appointment availability, the next appointment will be scheduled in approximately one month. The following treatment goal was established: decrease emotional eating. For the aforementioned goal, Erskine SquibbJane can benefit from individual therapy sessions that are brief in duration for approximately four to six sessions. The treatment modality will be individual therapeutic services, including an eclectic therapeutic approach utilizing techniques from Cognitive Behavioral Therapy, Patient Centered Therapy, Dialectical Behavior Therapy, Acceptance and Commitment Therapy, Interpersonal Therapy, and Cognitive Restructuring. Therapeutic approach will include various interventions as appropriate, such as validation, support, mindfulness, thought defusion, reframing, psychoeducation, values assessment, and role playing. This provider will regularly review the treatment plan and medical chart to keep informed of status changes. Erskine SquibbJane expressed understanding and agreement with the initial treatment plan of care.

## 2018-12-26 ENCOUNTER — Ambulatory Visit (INDEPENDENT_AMBULATORY_CARE_PROVIDER_SITE_OTHER): Payer: BC Managed Care – PPO | Admitting: Psychology

## 2018-12-26 ENCOUNTER — Other Ambulatory Visit: Payer: Self-pay

## 2018-12-26 DIAGNOSIS — F3289 Other specified depressive episodes: Secondary | ICD-10-CM

## 2018-12-26 DIAGNOSIS — F418 Other specified anxiety disorders: Secondary | ICD-10-CM

## 2019-01-11 ENCOUNTER — Other Ambulatory Visit: Payer: Self-pay

## 2019-01-11 DIAGNOSIS — Z20822 Contact with and (suspected) exposure to covid-19: Secondary | ICD-10-CM

## 2019-01-13 LAB — NOVEL CORONAVIRUS, NAA: SARS-CoV-2, NAA: NOT DETECTED

## 2019-01-15 ENCOUNTER — Ambulatory Visit (INDEPENDENT_AMBULATORY_CARE_PROVIDER_SITE_OTHER): Payer: BC Managed Care – PPO | Admitting: Family Medicine

## 2019-01-18 NOTE — Progress Notes (Signed)
Office: [phone]  /  Fax: [phone]    Date: January 25, 2019   Time Seen: 2:37pm Duration: 31 minutes Provider: Glennie Isle, Psy.D. Type of Session: Individual Therapy  Type of Contact: Face-to-face  Session Content: Lindsey [name] is a 63 y.o. female presenting for a follow-up appointment to address the previously established treatment goal of decreasing emotional eating. The session was initiated a brief check-in. Lindsey [name] stated, "I've been struggling with the eating," but described "being on track" for a bit. She further shared she is able to distinguish physical from emotional hunger. In addition, Lindsey [name] reported thinking about what it would feel like when she is "done" with her weight loss journey and discussed sorting through clothes that are too big. This provider validated Lindsey [name] and recommended she focus on the here and now versus what if scenarios. This provider also explored Lindsey [name]'s eating habits/schedule yesterday, as Lindsey [name] described engaging in emotional eating yesterday evening. It was reflected Lindsey [name] was going long periods without eating resulting in increased physical hunger. Thus, psychoeducation regarding the hunger and satisfaction scale was provided, and a handout was given to Lindsey [name]. It was also recommended she snack mid-afternoon per her typical eating schedule as it is likely she is experiencing physical hunger; however, Lindsey [name] shared she did not feel that was necessary. Nevertheless, she was encouraged to utilize the hunger and satisfaction scale to help increase awareness of when she is experiencing physical hunger throughout the day. Moreover, psychoeducation regarding mindfulness was provided. A handout was provided to Lindsey [name] with further information regarding mindfulness, including exercises. This provider also explained the benefit of mindfulness as it relates to emotional eating. Lindsey [name] was encouraged to engage in the provided exercises between now and the next appointment with this  provider. Lindsey [name] agreed. Due to Lindsey [name]'s self-report of frustration regarding weight loss and current concerns, this provider recommended longer-term therapeutic services. However, Lindsey [name] declined at this time. During today's appointment, Lindsey [name] was led through a mindfulness exercise involving her senses.   Mental Status Examination:  Appearance: neat Behavior: cooperative Mood: euthymic Affect: mood congruent Speech: normal in rate, volume, and tone Eye Contact: appropriate Psychomotor Activity: appropriate Thought Process: linear, logical, and goal directed  Content/Perceptual Disturbances: denies suicidal and homicidal ideation, plan, and intent and no hallucinations, delusions, bizarre thinking or behavior reported or observed Orientation: time, person, place and purpose of appointment Cognition/Sensorium: memory, attention, language, and fund of knowledge intact  Insight: fair Judgment: fair  Interventions:  Conducted a brief chart review Provided empathic reflections and validation Reviewed content from the previous session Psychoeducation provided regarding mindfulness Engaged patient in a mindfulness exercise Employed motivational interviewing skills to assess patient's willingness/desire to adhere to recommended medical treatments and assignments Discussed option for a referral for longer-term therapeutic services Psychoeducation provided regarding the hunger and satisfaction scale Employed supportive psychotherapy interventions to facilitate reduced distress, and to improve coping skills with identified stressors Employed acceptance and commitment interventions to emphasize mindfulness and acceptance without struggle  DSM-5 Diagnosis: 300.09 (F41.8) Other Specified Anxiety Disorder, Generalized anxiety not occurring more days than not and emotional eating behaviors  Treatment Goal & Progress: During the initial appointment with this provider, the following treatment goal was  established: decrease emotional eating. Lindsey [name] has demonstrated some progress in her goal as evidenced by increased awareness of hunger patterns.   Plan: [name] continues to appear able and willing to participate as evidenced by engagement in reciprocal conversation, and asking questions for clarification as appropriate. The next appointment will be scheduled in 2-3  weeks. The next session will focus on reviewing learned skills, and working towards the established treatment goal.

## 2019-01-25 ENCOUNTER — Other Ambulatory Visit: Payer: Self-pay

## 2019-01-25 ENCOUNTER — Ambulatory Visit (INDEPENDENT_AMBULATORY_CARE_PROVIDER_SITE_OTHER): Payer: BC Managed Care – PPO | Admitting: Psychology

## 2019-01-25 DIAGNOSIS — F418 Other specified anxiety disorders: Secondary | ICD-10-CM

## 2019-01-29 ENCOUNTER — Other Ambulatory Visit: Payer: Self-pay

## 2019-01-29 ENCOUNTER — Ambulatory Visit (INDEPENDENT_AMBULATORY_CARE_PROVIDER_SITE_OTHER): Payer: BC Managed Care – PPO | Admitting: Family Medicine

## 2019-01-29 ENCOUNTER — Encounter (INDEPENDENT_AMBULATORY_CARE_PROVIDER_SITE_OTHER): Payer: Self-pay | Admitting: Family Medicine

## 2019-01-29 ENCOUNTER — Telehealth: Payer: Self-pay | Admitting: Medical Oncology

## 2019-01-29 VITALS — BP 143/80 | HR 74 | Temp 98.4°F | Ht 66.0 in | Wt 201.0 lb

## 2019-01-29 DIAGNOSIS — R030 Elevated blood-pressure reading, without diagnosis of hypertension: Secondary | ICD-10-CM

## 2019-01-29 DIAGNOSIS — K5909 Other constipation: Secondary | ICD-10-CM | POA: Diagnosis not present

## 2019-01-29 DIAGNOSIS — Z6832 Body mass index (BMI) 32.0-32.9, adult: Secondary | ICD-10-CM | POA: Diagnosis not present

## 2019-01-29 DIAGNOSIS — E669 Obesity, unspecified: Secondary | ICD-10-CM

## 2019-01-29 MED ORDER — CONTRAVE 8-90 MG PO TB12: 8.0000 mg | ORAL_TABLET | ORAL | 0 refills | Status: DC

## 2019-01-29 NOTE — Telephone Encounter (Signed)
COMET: Referral Follow up call to patient as she and I had planned in early October. I spoke with patient this afternoon regarding the study and her request to wait till November to be randomized. Patient stated that she was ready and told me to go ahead and randomize her. I informed patient that she would need to see Dr. Lindi Adie prior to randomization, due to study requirement of MD visit to be within 30 days of randomization. I reminded patient that we discussed this at her consent signing when she informed me about wanting to wait. Patient stated that she did recall [place] discussing this. I asked patient if she wanted me to make an appointment to see Dr. Lindi Adie, patient asked that if I could call her tomorrow and she will have a better idea. I gave patient my understanding.  All patient's question's answered to her satisfaction. Patient thanked and encouraged to call me in the meantime.  Maxwell Marion, RN, BSN, [facility] Clinical Research 01/29/2019 4:59 PM

## 2019-01-29 NOTE — Progress Notes (Signed)
Office: [phone]  /  Fax: [phone]   HPI:   Chief Complaint: OBESITY Lindsey [name] is here to discuss her progress with her obesity treatment plan. She is on the keep a food journal with 1500-1800 calories and 100 grams of protein daily and is following her eating plan approximately 65 % of the time. She states she is walking for 45 minutes 6 times per week. Lindsey [name] continues to do well with weight loss. She is disappointed that she only lost 2 lbs in the last 7 weeks. She didn't get her Contrave due to pharmacy error.  Her weight is 201 lb (91.2 kg) today and has had a weight loss of 2 pounds over a period of 7 weeks since her last visit. She has lost 124 lbs since starting treatment with [place].  Constipation Lindsey [name] notes constipation, and she notes lower left quadrant to pelvic pain. She has questions about symptoms of ovarian cancer. She states her pain is intermittent cramping pain and decreased BM frequency. She states she is drinking 80 oz of water and eating high fiber foods. She denies hematochezia or melena.  Elevated Blood Pressure Lindsey [name]'s blood pressure is elevated without a history of hypertension. She seems especially worried today, and may be related to anxiety. She does not have a blood cuff at home.  ASSESSMENT AND PLAN:  Other constipation  Elevated blood pressure reading  Class 1 obesity with serious comorbidity and body mass index (BMI) of 32.0 to 32.9 in adult, unspecified obesity type - Plan: Naltrexone-buPROPion HCl ER (CONTRAVE) 8-90 MG TB12  PLAN:  Constipation Lindsey [name] decrease bowel movement frequency is normal while losing weight, but stools should not be hard or painful. She was advised to increase her H20 intake and work on increasing her fiber intake. High fiber foods were discussed today. Lindsey [name] was advised to use OTC miralax as needed, and she is to make an appointment with her primary care physician to discuss her ovarian cancer fear. Lindsey [name] agrees to follow  up with our clinic in 4 weeks.  Elevated Blood Pressure Lindsey [name] agrees to follow up with our clinic in 4 weeks, and we will recheck her blood pressure at that time.  I spent > than 50% of the 25 minute visit on counseling as documented in the note.  Obesity Lindsey [name] is currently in the action stage of change. As such, her goal is to continue with weight loss efforts She has agreed to keep a food journal with 1500-1800 calories and 100 grams of protein daily Lindsey [name] has been instructed to work up to a goal of 150 minutes of combined cardio and strengthening exercise per week for weight loss and overall health benefits. We discussed the following Behavioral Modification Strategies today: increasing fiber rich foods, increase H20 intake, and keep a strict food journal We discussed various medication options to help Lindsey [name] with her weight loss efforts and we both agreed to start Contrave 8-90 mg 2 tablets PO BID #120 with no refills.   Lindsey [name] has agreed to follow up with our clinic in 4 weeks. She was informed of the importance of frequent follow up visits to maximize her success with intensive lifestyle modifications for her multiple health conditions.  ALLERGIES: Allergies  Allergen Reactions  . Lactose Intolerance (Gi) Other (See Comments)    Cramping  . Sulfa Antibiotics   . Amoxicillin Rash    MEDICATIONS: Current Outpatient Medications on File Prior to Visit  Medication Sig Dispense Refill  . Cholecalciferol (VITAMIN D3) 125  MCG (5000 UT) CAPS Take 1 capsule by mouth daily.    . Multiple Vitamins-Minerals (WOMENS MULTIVITAMIN PLUS PO) Take 1 tablet by mouth daily.    . Turmeric 500 MG CAPS Take 1 capsule by mouth daily.     No current facility-administered medications on file prior to visit.     PAST MEDICAL HISTORY: Past Medical History:  Diagnosis Date  . Dry skin   . H/O seasonal allergies   . Hair loss   . Knee pain   . Multiple stiff joints   . Nasal congestion   . Skin tag    . Snoring   . Urgency of urination     PAST SURGICAL HISTORY: Past Surgical History:  Procedure Laterality Date  . FOOT SURGERY    . TONSILLECTOMY      SOCIAL HISTORY: Social History   Tobacco Use  . Smoking status: Former Smoker    Packs/day: 1.00    Years: 15.00    Pack years: 15.00    Types: Cigarettes  . Smokeless tobacco: Never Used  . Tobacco comment: quit 10 years ago  Substance Use Topics  . Alcohol use: Yes    Comment: 1-2 drinks a month or less  . Drug use: No    FAMILY HISTORY: Family History  Problem Relation Age of Onset  . High blood pressure Mother   . Depression Mother   . Obesity Mother   . Diabetes Father   . Heart disease Father   . Cancer Father   . Sleep apnea Father   . Obesity Father     ROS: Review of Systems  Constitutional: Positive for weight loss.  Gastrointestinal: Positive for abdominal pain and constipation. Negative for melena.       Negative hematochezia    PHYSICAL EXAM: Blood pressure (!) 143/80, pulse 74, temperature 98.4 F (36.9 C), temperature source Oral, height 5\' 6"  (1.676 m), weight 201 lb (91.2 kg), SpO2 97 %. Body mass index is 32.44 kg/m. Physical Exam Vitals signs reviewed.  Constitutional:      Appearance: Normal appearance. She is obese.  Cardiovascular:     Rate and Rhythm: Normal rate.     Pulses: Normal pulses.  Pulmonary:     Effort: Pulmonary effort is normal.     Breath sounds: Normal breath sounds.  Musculoskeletal: Normal range of motion.  Skin:    General: Skin is warm and dry.  Neurological:     Mental Status: She is alert and oriented to person, place, and time.  Psychiatric:        Mood and Affect: Mood normal.        Behavior: Behavior normal.     RECENT LABS AND TESTS: BMET    Component Value Date/Time   NA 141 03/09/2018 0922   K 4.7 03/09/2018 0922   CL 102 03/09/2018 0922   CO2 26 03/09/2018 0922   GLUCOSE 92 03/09/2018 0922   BUN 18 03/09/2018 0922   CREATININE 0.75  03/09/2018 0922   CALCIUM 9.7 03/09/2018 0922   GFRNONAA 86 03/09/2018 0922   GFRAA 99 03/09/2018 0922   Lab Results  Component Value Date   HGBA1C 5.4 03/09/2018   HGBA1C 5.5 10/12/2017   HGBA1C 5.7 (H) 05/25/2017   Lab Results  Component Value Date   INSULIN 10.0 03/09/2018   INSULIN 19.1 10/12/2017   INSULIN 44.2 (H) 05/25/2017   CBC    Component Value Date/Time   WBC 6.0 05/25/2017 1147   RBC 4.46 05/25/2017  1147   HGB 13.6 05/25/2017 1147   HCT 40.7 05/25/2017 1147   MCV 91 05/25/2017 1147   MCH 30.5 05/25/2017 1147   MCHC 33.4 05/25/2017 1147   RDW 13.9 05/25/2017 1147   LYMPHSABS 1.8 05/25/2017 1147   EOSABS 0.2 05/25/2017 1147   BASOSABS 0.0 05/25/2017 1147   Iron/TIBC/Ferritin/ %Sat No results found for: IRON, TIBC, FERRITIN, IRONPCTSAT Lipid Panel     Component Value Date/Time   CHOL 162 03/09/2018 0922   TRIG 61 03/09/2018 0922   HDL 65 03/09/2018 0922   LDLCALC 85 03/09/2018 0922   Hepatic Function Panel     Component Value Date/Time   PROT 6.6 03/09/2018 0922   ALBUMIN 4.4 03/09/2018 0922   AST 17 03/09/2018 0922   ALT 20 03/09/2018 0922   ALKPHOS 79 03/09/2018 0922   BILITOT 0.8 03/09/2018 0922      Component Value Date/Time   TSH 2.070 05/25/2017 1147      OBESITY BEHAVIORAL INTERVENTION VISIT  Today's visit was # 18   Starting weight: 325 lbs Starting date: 05/25/17 Today's weight : 201 lbs Today's date: 01/29/2019 Total lbs lost to date: 124    ASK: We discussed the diagnosis of obesity with Lindsey [name] today and Erskine SquibbJane agreed to give Lindsey [name] permission to discuss obesity behavioral modification therapy today.  ASSESS: Erskine SquibbJane has the diagnosis of obesity and her BMI today is 32.46 Erskine SquibbJane is in the action stage of change   ADVISE: Erskine SquibbJane was educated on the multiple health risks of obesity as well as the benefit of weight loss to improve her health. She was advised of the need for long term treatment and the importance of lifestyle  modifications to improve her current health and to decrease her risk of future health problems.  AGREE: Multiple dietary modification options and treatment options were discussed and  Erskine SquibbJane agreed to follow the recommendations documented in the above note.  ARRANGE: Erskine SquibbJane was educated on the importance of frequent visits to treat obesity as outlined per CMS and USPSTF guidelines and agreed to schedule her next follow up appointment today.  I, Burt KnackSharon Martin, am acting as transcriptionist for Quillian Quincearen , MD  I have reviewed the above documentation for accuracy and completeness, and I agree with the above. -Quillian Quincearen , MD

## 2019-01-30 ENCOUNTER — Encounter (INDEPENDENT_AMBULATORY_CARE_PROVIDER_SITE_OTHER): Payer: Self-pay | Admitting: Family Medicine

## 2019-01-30 NOTE — Telephone Encounter (Signed)
I think she is talking about the Contrave.  I will start the prior authorization unless you want to change medication.

## 2019-01-30 NOTE — Telephone Encounter (Signed)
Ok, thanks, nope, no change.

## 2019-01-31 ENCOUNTER — Telehealth: Payer: Self-pay | Admitting: Medical Oncology

## 2019-01-31 NOTE — Telephone Encounter (Signed)
COMET LVMOM with patient regarding scheduling an office visit with Dr. Lindi Adie for study enrollment. Patient was asked to return call at earliest convenience. Return number provided.

## 2019-01-31 NOTE — Progress Notes (Signed)
  Office: [phone]  /  Fax: [phone]    Date: February 13, 2019   Time Seen: 12:37pm Duration: 34 minutes Provider: Glennie Isle, Psy.D. Type of Session: Individual Therapy  Type of Contact: Face-to-face  Session Content: Lindsey [name] is a 63 y.o. female presenting for a follow-up appointment to address the previously established treatment goal of decreasing emotional eating. The session was initiated with a brief check-in. Lindsey [name] stated a reduction in emotional eating and discussed using the 5-4-3-2-1 strategy, but noted, "It didn't help." She further shared about recent eating habits, and it was reflected Lindsey [name] is following Dr. Migdalia Dk recommendations. As session progressed, Lindsey [name] described experiencing "rebellion" and feeling as though she is a "three year old." More specifically, Lindsey [name] expressed desire to "be normal" and not have to worry about what she is eating. Associated thoughts and feelings were processed. Further psychoeducation regarding mindfulness was provided to assist with coping. The handout previously shared was provided again to Lindsey [name] that included exercises, and she was encouraged to engage in one a day independent of eating related concerns to assist with dropping out of automatic pilot. This provider also discussed the rationale for engaging in mindfulness exercises regularly. Lindsey [name] was receptive to today's session as evidenced by openness to sharing, responsiveness to feedback, and willingness to engage in mindfulness exercises daily.   Mental Status Examination:  Appearance: neat Behavior: cooperative Mood: euthymic Affect: mood congruent Speech: normal in rate, volume, and tone Eye Contact: appropriate Psychomotor Activity: appropriate Thought Process: linear, logical, and goal directed  Content/Perceptual Disturbances: no hallucinations, delusions, bizarre thinking or behavior reported or observed and no evidence of suicidal and homicidal ideation, plan, and intent  Orientation: time, person, place, purpose of appointment and none Cognition/Sensorium: memory, attention, language, and fund of knowledge intact  Insight: good Judgment: good  Interventions:  Conducted a brief chart review Provided empathic reflections and validation Reviewed content from the previous session Processed thoughts and feelings Employed motivational interviewing skills to assess patient's willingness/desire to adhere to recommended medical treatments and assignments Employed supportive psychotherapy interventions to facilitate reduced distress, and to improve coping skills with identified stressors Further psychoeducation provided regarding mindfulness  DSM-5 Diagnosis: 300.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 Lindsey [name] has demonstrated progress in her goal as evidenced by increased awareness of hunger patterns and triggers for emotional eating. [name] also reported a reduction in emotional eating and demonstrates willingness to engage in mindfulness exercises.  Plan: Lindsey [name] continues to appear able and willing to participate as evidenced by engagement in reciprocal conversation, and asking questions for clarification as appropriate. Lindsey [name] requested the next appointment be scheduled in January of 2021; however, this provider recommended an appointment be scheduled sooner. As such, the next appointment will be scheduled in three weeks. The next session will focus further on mindfulness.

## 2019-02-05 ENCOUNTER — Telehealth: Payer: Self-pay | Admitting: Medical Oncology

## 2019-02-05 ENCOUNTER — Telehealth: Payer: Self-pay | Admitting: Hematology and Oncology

## 2019-02-05 NOTE — Telephone Encounter (Signed)
Added f/u 11/25 per 11/16 schedule message. Date/time/patient aware per schedule message.

## 2019-02-05 NOTE — Telephone Encounter (Signed)
COMET: Referral Call to patient regarding setting an appointment to see Dr. Lindi Adie for H&P for COMET study. Patient had signed consent October 1st 2020 and asked to wait on being randomized until November. At that time, patient was informed she would need to see MD for another H&P because the study requires one within 30 days of randomization, patient gave her verbal understanding to this requirement for eligibility for enrollment. Today patient informed me of her availability and we have her to see Dr. Lindi Adie November 25th @ 2 PM. Patient thanked for her time and encouraged to call Dr. Lindi Adie or myself with questions/concerns. Message sent to scheduling for appointment. Maxwell Marion, RN, BSN, [facility] Clinical Research 02/05/2019 1:43 PM

## 2019-02-07 ENCOUNTER — Encounter (INDEPENDENT_AMBULATORY_CARE_PROVIDER_SITE_OTHER): Payer: Self-pay

## 2019-02-13 ENCOUNTER — Other Ambulatory Visit: Payer: Self-pay

## 2019-02-13 ENCOUNTER — Ambulatory Visit (INDEPENDENT_AMBULATORY_CARE_PROVIDER_SITE_OTHER): Payer: BC Managed Care – PPO | Admitting: Psychology

## 2019-02-13 DIAGNOSIS — F418 Other specified anxiety disorders: Secondary | ICD-10-CM

## 2019-02-13 NOTE — Progress Notes (Signed)
[facility]cer Center CONSULT NOTE  Patient Care Team: Aliene BeamsHagler, Rachel, MD as PCP - General (Family Medicine)  CHIEF COMPLAINTS  DCIS, Comet clinical trial enrollment  HISTORY OF PRESENTING ILLNESS:  Lindsey [name] 63 y.o. female is here because of diagnosed with DCIS in September 2020.  She had a routine screening mammogram which detected calcifications which on biopsy showed low-grade DCIS that was ER/PR positive.  She is interested in Comet clinical trial and is here today to be randomized. She does not complain of any lumps or nodules in the breast.  She has had occasional discomfort in the breast but nothing constant.  Denies any bone pain or any other symptoms elsewhere.  I reviewed her records extensively and collaborated the history with the patient.  SUMMARY OF ONCOLOGIC HISTORY: Oncology History  Ductal carcinoma in situ (DCIS) of left breast  11/21/2018 Initial Diagnosis   Routine screening mammogram detected calcifications in the left breast. Biopsy showed low-grade DCIS, ER+ 100%, PR+ 100%.       MEDICAL HISTORY:  Past Medical History:  Diagnosis Date  . Dry skin   . H/O seasonal allergies   . Hair loss   . Knee pain   . Multiple stiff joints   . Nasal congestion   . Skin tag   . Snoring   . Urgency of urination     SURGICAL HISTORY: Past Surgical History:  Procedure Laterality Date  . FOOT SURGERY    . TONSILLECTOMY      SOCIAL HISTORY: Social History   Socioeconomic History  . Marital status: Single    Spouse name: Not on file  . Number of children: 0  . Years of education: Not on file  . Highest education level: Not on file  Occupational History  . Occupation: [employer]  Social Needs  . Financial resource strain: Not on file  . Food insecurity    Worry: Not on file    Inability: Not on file  . Transportation needs    Medical: Not on file    Non-medical: Not on file  Tobacco Use  . Smoking status: Former Smoker    Packs/day:  1.00    Years: 15.00    Pack years: 15.00    Types: Cigarettes  . Smokeless tobacco: Never Used  . Tobacco comment: quit 10 years ago  Substance and Sexual Activity  . Alcohol use: Yes    Comment: 1-2 drinks a month or less  . Drug use: No  . Sexual activity: Not on file  Lifestyle  . Physical activity    Days per week: Not on file    Minutes per session: Not on file  . Stress: Not on file  Relationships  . Social Musicianconnections    Talks on phone: Not on file    Gets together: Not on file    Attends religious service: Not on file    Active member of club or organization: Not on file    Attends meetings of clubs or organizations: Not on file    Relationship status: Not on file  . Intimate partner violence    Fear of current or ex partner: Not on file    Emotionally abused: Not on file    Physically abused: Not on file    Forced sexual activity: Not on file  Other Topics Concern  . Not on file  Social History Narrative  . Not on file    FAMILY HISTORY: Family History  Problem Relation Age  of Onset  . High blood pressure Mother   . Depression Mother   . Obesity Mother   . Diabetes Father   . Heart disease Father   . Cancer Father   . Sleep apnea Father   . Obesity Father     ALLERGIES:  is allergic to lactose intolerance (gi); sulfa antibiotics; and amoxicillin.  MEDICATIONS:  Current Outpatient Medications  Medication Sig Dispense Refill  . Cholecalciferol (VITAMIN D3) 125 MCG (5000 UT) CAPS Take 1 capsule by mouth daily.    . Multiple Vitamins-Minerals (WOMENS MULTIVITAMIN PLUS PO) Take 1 tablet by mouth daily.    . Naltrexone-buPROPion HCl ER (CONTRAVE) 8-90 MG TB12 Take 8-90 mg by mouth as directed. Take two tabs by mouth twice daily 120 tablet 0  . Turmeric 500 MG CAPS Take 1 capsule by mouth daily.     No current facility-administered medications for this visit.     REVIEW OF SYSTEMS:   Constitutional: Denies fevers, chills or abnormal night sweats Eyes:  Denies blurriness of vision, double vision or watery eyes Ears, nose, mouth, throat, and face: Denies mucositis or sore throat Respiratory: Denies cough, dyspnea or wheezes Cardiovascular: Denies palpitation, chest discomfort or lower extremity swelling Gastrointestinal:  Denies nausea, heartburn or change in bowel habits Skin: Denies abnormal skin rashes Lymphatics: Denies new lymphadenopathy or easy bruising Neurological:Denies numbness, tingling or new weaknesses Behavioral/Psych: Mood is stable, no new changes  Breast:  Denies any palpable lumps or discharge All other systems were reviewed with the patient and are negative.  PHYSICAL EXAMINATION: ECOG PERFORMANCE STATUS: 1 - Symptomatic but completely ambulatory  Vitals:   02/14/19 1420  BP: (!) 144/86  Pulse: 63  Resp: 18  Temp: 98.3 F (36.8 C)  SpO2: 100%   Filed Weights   02/14/19 1420  Weight: 202 lb 8 oz (91.9 kg)    GENERAL:alert, no distress and comfortable SKIN: skin color, texture, turgor are normal, no rashes or significant lesions EYES: normal, conjunctiva are pink and non-injected, sclera clear OROPHARYNX:no exudate, no erythema and lips, buccal mucosa, and tongue normal  NECK: supple, thyroid normal size, non-tender, without nodularity LYMPH:  no palpable lymphadenopathy in the cervical, axillary or inguinal LUNGS: clear to auscultation and percussion with normal breathing effort HEART: regular rate & rhythm and no murmurs and no lower extremity edema ABDOMEN:abdomen soft, non-tender and normal bowel sounds Musculoskeletal:no cyanosis of digits and no clubbing  PSYCH: alert & oriented x 3 with fluent speech NEURO: no focal motor/sensory deficits BREAST: No palpable nodules in breast. No palpable axillary or supraclavicular lymphadenopathy (exam performed in the presence of a chaperone)   LABORATORY DATA:  I have reviewed the data as listed Lab Results  Component Value Date   WBC 6.0 05/25/2017   HGB  13.6 05/25/2017   HCT 40.7 05/25/2017   MCV 91 05/25/2017   Lab Results  Component Value Date   NA 141 03/09/2018   K 4.7 03/09/2018   CL 102 03/09/2018   CO2 26 03/09/2018    RADIOGRAPHIC STUDIES: I have personally reviewed the radiological reports and agreed with the findings in the report.  ASSESSMENT AND PLAN:  Ductal carcinoma in situ (DCIS) of left breast 11/21/2018: Screening detected left breast mammogram revealing calcifications: Biopsy revealed low-grade DCIS ER 100%, PR 100% Tis NX stage 0  Treatment plan: Comet clinical trial participation. AFT 25 COMET Phase 3 clinical trial for low risk DCIS grade 1/2 PR positive, age greater than 40 randomized to surgery +/- radiation, +/-  endocrine therapy versus active surveillance with +/- endocrine therapy surveillance with mammograms every 6 months for 5 years;patient's have option to decline elevated arm and still be followed on study   Once we receive the randomization, patient can be started on tamoxifen if she is randomized to active surveillance arm. Patient was not keen on taking antiestrogen therapy. I discussed with her about the new clinical studies showing that at 5 mg tamoxifen can still be effective. She will think about this and get back to me whether she wants to take the 5 mg dosage of tamoxifen or not. If she gets randomized to surgery then she wants to surgery in the new year.  Return to clinic based upon randomization.   All questions were answered. The patient knows to call the clinic with any problems, questions or concerns.    Tamsen Meek, MD 02/14/19

## 2019-02-14 ENCOUNTER — Inpatient Hospital Stay: Payer: BC Managed Care – PPO | Attending: Hematology and Oncology | Admitting: Hematology and Oncology

## 2019-02-14 ENCOUNTER — Telehealth: Payer: Self-pay | Admitting: Medical Oncology

## 2019-02-14 ENCOUNTER — Encounter: Payer: Self-pay | Admitting: Medical Oncology

## 2019-02-14 ENCOUNTER — Other Ambulatory Visit: Payer: Self-pay

## 2019-02-14 DIAGNOSIS — Z006 Encounter for examination for normal comparison and control in clinical research program: Secondary | ICD-10-CM

## 2019-02-14 DIAGNOSIS — D0512 Intraductal carcinoma in situ of left breast: Secondary | ICD-10-CM | POA: Diagnosis not present

## 2019-02-14 DIAGNOSIS — Z170 Estrogen receptor positive status [ER+]: Secondary | ICD-10-CM | POA: Insufficient documentation

## 2019-02-14 MED ORDER — FLUTICASONE PROPIONATE 50 MCG/ACT NA SUSP: 1.0000 | Freq: Every day | NASAL | 2 refills | Status: DC

## 2019-02-14 NOTE — Progress Notes (Signed)
COMET:  Patient in clinic today for H&P for study randomization.  I met with patient prior to her visit with Dr. Lindi Adie and spoke to her about the study randomization process and inquired with her if she had any questions. After all of patient's questions were answered, I reviewed with patient her baseline solicited events for the study. Dr. Lindi Adie assessed patient today (see MD encounter notes) and patient with no changes since her last visit.  Patient had signed consent on 12/21/2018 and she had asked to wait to be randomized at that time, should she be randomized to the treatment arm, she did not wish to have surgery until 2021. Per Dr. Justice Rocher,  with study, this was ok to do (email confirmation received).  Per Dr. Geralyn Flash assessment today, patient is eligible for study and can proceed to randomization. Second nurse verification by research nurse, Yolande Jolly.  I informed patient that I will call her before the end of the day to inform her of the randomization result. All patient's questions answered to her satisfaction. Patient thanked for her time and interest in study. Maxwell Marion, RN, BSN, [facility] Clinical Research 02/14/2019 3:00 PM  Lindsey [name] [id number]   Adverse Event Log  Study/Protocol: COMET Cycle: Baseline  Event Grade Onset Date Resolved Date Drug Name Attribution Treatment Comments  Hot flashes 1 Baseline ongoing N/A History of None   Hypertension 2 Baseline ongoing N/A History of None   Pain- bilateral knees 1 Baseline ongoing N/A History of Conmed

## 2019-02-14 NOTE — Telephone Encounter (Signed)
COMET: Randomized Call to patient to inform her that she has been randomized to the Active Monitoring Arm with the COMET study.  Patient states that she was fine with the results for now, and should she change her mind, she will let me know. I also informed patient her next visit would be for a mammogram of the affected breast and clinic visit with Dr. Lindi Adie and research, in approximately 6 months time and that I will call her one month prior to schedule for these appointments, patient gave verbal understanding to this. Patient was also reminded that she will be receiving the study questionnaires, sent to her in paper form, as she requested, just prior to the 6 month visit. I informed patient that there was a study research lab draw that we may need to be completed and that I would contact her next week with the details and patient gave verbal understanding to this. Patient was provided with my contact information for questions or concerns and was also encouraged to call Dr. Lindi Adie. Patient informed me that she has not made a decision as to whether she wishes to start tamoxifen and states she will have a better idea next week after she has had some time to think about it.  I thanked patient for her time and contribution to study and encouraged her to call with questions.

## 2019-02-14 NOTE — Assessment & Plan Note (Signed)
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 AFT 25 COMET Phase 3 clinical trial for low risk DCIS grade 1/2 PR positive, age greater than 40 randomized to surgery +/- radiation, +/- endocrine therapy versus active surveillance with +/- endocrine therapy surveillance with mammograms every 6 months for 5 years;patient's have option to decline elevated arm and still be followed on study   Once we receive the randomization, patient can be started on tamoxifen if she is randomized to active surveillance arm.

## 2019-02-20 ENCOUNTER — Telehealth: Payer: Self-pay | Admitting: Medical Oncology

## 2019-02-20 ENCOUNTER — Telehealth: Payer: Self-pay | Admitting: Hematology and Oncology

## 2019-02-20 ENCOUNTER — Other Ambulatory Visit: Payer: Self-pay | Admitting: Medical Oncology

## 2019-02-20 DIAGNOSIS — D0512 Intraductal carcinoma in situ of left breast: Secondary | ICD-10-CM

## 2019-02-20 NOTE — Telephone Encounter (Signed)
COMET: [place] with patient regarding appointment for collection of baseline research labs. Patient confirmed available to come to clinic tomorrow for lab appointment between 2 and 4 PM. Patient informed that scheduling will call her with lab appointment time. Patient thanked and encouraged to call with questions.  Maxwell Marion, RN, BSN, [facility] Clinical Research 02/20/2019 2:51 PM

## 2019-02-20 NOTE — Telephone Encounter (Signed)
Confirmed 12/2 appointment with patient.  °

## 2019-02-21 ENCOUNTER — Other Ambulatory Visit: Payer: Self-pay

## 2019-02-21 ENCOUNTER — Inpatient Hospital Stay: Payer: BC Managed Care – PPO | Attending: Hematology and Oncology

## 2019-02-21 DIAGNOSIS — Z006 Encounter for examination for normal comparison and control in clinical research program: Secondary | ICD-10-CM | POA: Insufficient documentation

## 2019-02-21 DIAGNOSIS — D0512 Intraductal carcinoma in situ of left breast: Secondary | ICD-10-CM | POA: Insufficient documentation

## 2019-02-21 LAB — RESEARCH LABS

## 2019-02-26 ENCOUNTER — Ambulatory Visit (INDEPENDENT_AMBULATORY_CARE_PROVIDER_SITE_OTHER): Payer: BC Managed Care – PPO | Admitting: Family Medicine

## 2019-02-26 ENCOUNTER — Encounter (INDEPENDENT_AMBULATORY_CARE_PROVIDER_SITE_OTHER): Payer: Self-pay | Admitting: Family Medicine

## 2019-02-26 ENCOUNTER — Other Ambulatory Visit: Payer: Self-pay

## 2019-02-26 VITALS — BP 119/65 | HR 69 | Temp 98.3°F | Ht 66.0 in | Wt 199.0 lb

## 2019-02-26 DIAGNOSIS — E559 Vitamin D deficiency, unspecified: Secondary | ICD-10-CM | POA: Diagnosis not present

## 2019-02-26 DIAGNOSIS — Z9189 Other specified personal risk factors, not elsewhere classified: Secondary | ICD-10-CM | POA: Diagnosis not present

## 2019-02-26 DIAGNOSIS — Z6832 Body mass index (BMI) 32.0-32.9, adult: Secondary | ICD-10-CM

## 2019-02-26 DIAGNOSIS — E669 Obesity, unspecified: Secondary | ICD-10-CM

## 2019-02-26 DIAGNOSIS — R7303 Prediabetes: Secondary | ICD-10-CM

## 2019-02-26 DIAGNOSIS — F3289 Other specified depressive episodes: Secondary | ICD-10-CM | POA: Diagnosis not present

## 2019-02-26 NOTE — Progress Notes (Signed)
  Office: [phone]  /  Fax: [phone]    Date: March 06, 2019   Time Seen: 10:34am Duration: 34 minutes Provider: Glennie Isle, Psy.D. Type of Session: Individual Therapy  Type of Contact: Face-to-face  Session Content: Lindsey [name] is a 63 y.o. female presenting for a follow-up appointment to address the previously established treatment goal of decreasing emotional eating. The session was initiated with a brief check-in. Lindsey [name] shared about her recent weight loss and described it as "distressing." She further noted "eating hasn't been that good" and discussed a reduction in walking due to work and weather. Lindsey [name] shared her labs and metabolism will be repeated in the beginning of the year. Today's appointment focused further on mindfulness. Lindsey [name] reported, "I'm trying to get out of my head." As such, further psychoeducation regarding mindfulness was provided, including psychoeducation regarding formal (e.g., setting aside a specific time daily to engage in an exercise) and informal (e.g., cultivating awareness in the present moment and taking a non-judgmental approach while engaging in day-to-day tasks) mindfulness. She was encouraged to incorporate discussed mindfulness exercises when walking. This provider also discussed the utilization of YouTube for mindfulness exercises (e.g., videos by Merri Ray). To further assist with Lindsey [name]'s mindfulness practice, this provider shared a handout for a mindfulness exercise focusing on the breath as an anchor. Overall, Lindsey [name] was receptive to today's appointment as evidenced by openness to sharing, responsiveness to feedback, and willingness to engage in mindfulness exercises to assist with coping.  Mental Status Examination:  Appearance: well groomed and appropriate hygiene  Behavior: appropriate to circumstances Mood: euthymic Affect: mood congruent Speech: normal in rate, volume, and tone Eye Contact: appropriate Psychomotor Activity: appropriate Gait:  normal Thought Process: linear, logical, and goal directed  Thought Content/Perception: no hallucinations, delusions, bizarre thinking or behavior reported or observed and no evidence of suicidal and homicidal ideation, plan, and intent Orientation: time, person, place and purpose of appointment Memory/Concentration: memory, attention, language, and fund of knowledge intact  Insight/Judgment: good  Interventions:  Conducted a brief chart review Provided empathic reflections and validation Reviewed content from the previous session Employed supportive psychotherapy interventions to facilitate reduced distress and to improve coping skills with identified stressors Employed motivational interviewing skills to assess patient's willingness/desire to adhere to recommended medical treatments and assignments Further psychoeducation provided regarding mindfulness  DSM-5 Diagnosis: 300.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 Lindsey [name] has demonstrated progress in her goal as evidenced by increased awareness of hunger patterns and increased awareness of triggers for emotional eating. Lindsey [name] also demonstrates willingness to engage in mindfulness exercises.  Plan: Due to the upcoming holidays and this provider being out of the office in the coming weeks, the next appointment will be scheduled in one month. The next session will focus on working towards the established treatment goal.

## 2019-02-27 ENCOUNTER — Encounter: Payer: Self-pay | Admitting: *Deleted

## 2019-02-27 NOTE — Progress Notes (Signed)
Office: [phone]  /  Fax: [phone]   HPI:   Chief Complaint: OBESITY Lindsey [name] is here to discuss her progress with her obesity treatment plan. She is on the keep a food journal with 1500-1800 calories and 100 grams of protein daily and is following her eating plan approximately 93 % of the time. She states she is walking for 45 minutes 6-7 times per week. Lindsey [name] is down 26 lbs total and is down 2 lbs today since her last visit. She is unhappy with the rate of weight loss.  Her weight is 199 lb (90.3 kg) today and has had a weight loss of 2 pounds over a period of 4 weeks since her last visit. She has lost 26 lbs since starting treatment with [place].  Pre-Diabetes Lindsey [name] has a diagnosis of pre-diabetes based on her elevated Hgb A1c and was informed this puts her at greater risk of developing diabetes. We reviewed previous labs today. She continues to work on diet and exercise to decrease risk of diabetes.  At risk for diabetes Lindsey [name] is at higher than average risk for developing diabetes due to her obesity and pre-diabetes.   Vitamin D Deficiency Lindsey [name] has a diagnosis of vitamin D deficiency. We reviewed previous labs today. She is currently taking Vit D supplementation and denies nausea, vomiting or muscle weakness.  Depression with Emotional Eating Behaviors [name] denies Contrave and she is working with Dr. Dewaine Conger. [name] struggles with emotional eating and using food for comfort to the extent that it is negatively impacting her health. She often snacks when she is not hungry. [name] sometimes feels she is out of control and then feels guilty that she made poor food choices. She has been working on behavior modification techniques to help reduce her emotional eating and has been somewhat successful. She shows no sign of suicidal or homicidal ideations.  Depression screen [facility] 2/9 12/28/2017 11/30/2017 11/16/2017 11/02/2017 05/25/2017  Decreased Interest 0 1 0 0 1  Down, Depressed, Hopeless 1 1 0 0 2  PHQ  - 2 Score 1 2 0 0 3  Altered sleeping 0 1 0 0 0  Tired, decreased energy 0 0 1 0 3  Change in appetite 1 0 0 1 1  Feeling bad or failure about yourself  1 1 0 1 2  Trouble concentrating 0 0 0 1 2  Moving slowly or fidgety/restless 0 0 1 0 3  Suicidal thoughts 0 0 0 0 1  PHQ-9 Score Difficult doing work/chores - - - - Somewhat difficult    ASSESSMENT AND PLAN:  Prediabetes  Vitamin D deficiency  Other depression, emotional eating  At risk for diabetes mellitus  Class 1 obesity with serious comorbidity and body mass index (BMI) of 32.0 to 32.9 in adult, unspecified obesity type  PLAN:  Pre-Diabetes [name] will continue to work on weight loss, exercise, and decreasing simple carbohydrates to help decrease the risk of diabetes. We will recheck labs at her next visit.  Diabetes risk counseling (~15 min) Lindsey [name] is a 63 y.o. female and has risk factors for diabetes including obesity and pre-diabetes. We discussed intensive lifestyle modifications today with an emphasis on weight loss as well as increasing exercise and decreasing simple carbohydrates in her diet.  Vitamin D Deficiency Low vitamin D level contributes to fatigue and are associated with obesity, breast, and colon cancer. Lindsey [name] agrees to continue taking Vit D and will follow up for routine testing of vitamin D, at  least 2-3 times per year to avoid over-replacement. We will recheck labs at her next visit. Lindsey [name] agrees to follow up with our clinic in 2 weeks.  Emotional Eating Behaviors (other depression) Behavior modification techniques were discussed today to help Lindsey [name] deal with her emotional/non-hunger eating behaviors. Lindsey [name] will continue her current treatment. We will continue to follow and monitor her progress.  Obesity Lindsey [name] is currently in the action stage of change. As such, her goal is to continue with weight loss efforts She has agreed to keep a food journal with 1500-1800 calories and 100 grams of protein  daily Lindsey [name] has been instructed to work up to a goal of 150 minutes of combined cardio and strengthening exercise per week for weight loss and overall health benefits. We discussed the following Behavioral Modification Strategies today: increasing lean protein intake, decreasing simple carbohydrates, increasing vegetables, increase H20 intake, work on meal planning and easy cooking plans and emotional eating strategies We will recheck IC at her next visit.  Lindsey [name] has agreed to follow up with our clinic in 2 weeks. She was informed of the importance of frequent follow up visits to maximize her success with intensive lifestyle modifications for her multiple health conditions.  ALLERGIES: Allergies  Allergen Reactions  . Lactose Intolerance (Gi) Other (See Comments)    Cramping  . Sulfa Antibiotics   . Amoxicillin Rash    MEDICATIONS: Current Outpatient Medications on File Prior to Visit  Medication Sig Dispense Refill  . Cholecalciferol (VITAMIN D3) 125 MCG (5000 UT) CAPS Take 1 capsule by mouth daily.    . fluticasone (FLONASE) 50 MCG/ACT nasal spray Place 1 spray into both nostrils daily.  2  . Glucosamine-Chondroitin (GLUCOSAMINE CHONDR COMPLEX PO) Take by mouth.    . Multiple Vitamins-Minerals (WOMENS MULTIVITAMIN PLUS PO) Take 1 tablet by mouth daily.    . Turmeric 500 MG CAPS Take 1 capsule by mouth daily.     No current facility-administered medications on file prior to visit.     PAST MEDICAL HISTORY: Past Medical History:  Diagnosis Date  . Dry skin   . H/O seasonal allergies   . Hair loss   . Knee pain   . Multiple stiff joints   . Nasal congestion   . Skin tag   . Snoring   . Urgency of urination     PAST SURGICAL HISTORY: Past Surgical History:  Procedure Laterality Date  . FOOT SURGERY    . TONSILLECTOMY      SOCIAL HISTORY: Social History   Tobacco Use  . Smoking status: Former Smoker    Packs/day: 1.00    Years: 15.00    Pack years: 15.00    Types:  Cigarettes  . Smokeless tobacco: Never Used  . Tobacco comment: quit 10 years ago  Substance Use Topics  . Alcohol use: Yes    Comment: 1-2 drinks a month or less  . Drug use: No    FAMILY HISTORY: Family History  Problem Relation Age of Onset  . High blood pressure Mother   . Depression Mother   . Obesity Mother   . Diabetes Father   . Heart disease Father   . Cancer Father   . Sleep apnea Father   . Obesity Father     ROS: Review of Systems  Constitutional: Positive for weight loss.  Gastrointestinal: Negative for nausea and vomiting.  Genitourinary: Negative for frequency.  Musculoskeletal:       Negative muscle weakness  Endo/Heme/Allergies: Negative for polydipsia.  Psychiatric/Behavioral: Positive for depression. Negative for suicidal ideas.    PHYSICAL EXAM: Blood pressure 119/65, pulse 69, temperature 98.3 F (36.8 C), temperature source Oral, height 5\' 6"  (1.676 m), weight 199 lb (90.3 kg), SpO2 99 %. Body mass index is 32.12 kg/m. Physical Exam Vitals signs reviewed.  Constitutional:      Appearance: Normal appearance. She is obese.  Cardiovascular:     Rate and Rhythm: Normal rate.     Pulses: Normal pulses.  Pulmonary:     Effort: Pulmonary effort is normal.     Breath sounds: Normal breath sounds.  Musculoskeletal: Normal range of motion.  Skin:    General: Skin is warm and dry.  Neurological:     Mental Status: She is alert and oriented to person, place, and time.  Psychiatric:        Mood and Affect: Mood normal.        Behavior: Behavior normal.     RECENT LABS AND TESTS: BMET    Component Value Date/Time   NA 141 03/09/2018 0922   K 4.7 03/09/2018 0922   CL 102 03/09/2018 0922   CO2 26 03/09/2018 0922   GLUCOSE 92 03/09/2018 0922   BUN 18 03/09/2018 0922   CREATININE 0.75 03/09/2018 0922   CALCIUM 9.7 03/09/2018 0922   GFRNONAA 86 03/09/2018 0922   GFRAA 99 03/09/2018 0922   Lab Results  Component Value Date   HGBA1C 5.4  03/09/2018   HGBA1C 5.5 10/12/2017   HGBA1C 5.7 (H) 05/25/2017   Lab Results  Component Value Date   INSULIN 10.0 03/09/2018   INSULIN 19.1 10/12/2017   INSULIN 44.2 (H) 05/25/2017   CBC    Component Value Date/Time   WBC 6.0 05/25/2017 1147   RBC 4.46 05/25/2017 1147   HGB 13.6 05/25/2017 1147   HCT 40.7 05/25/2017 1147   MCV 91 05/25/2017 1147   MCH 30.5 05/25/2017 1147   MCHC 33.4 05/25/2017 1147   RDW 13.9 05/25/2017 1147   LYMPHSABS 1.8 05/25/2017 1147   EOSABS 0.2 05/25/2017 1147   BASOSABS 0.0 05/25/2017 1147   Iron/TIBC/Ferritin/ %Sat No results found for: IRON, TIBC, FERRITIN, IRONPCTSAT Lipid Panel     Component Value Date/Time   CHOL 162 03/09/2018 0922   TRIG 61 03/09/2018 0922   HDL 65 03/09/2018 0922   LDLCALC 85 03/09/2018 0922   Hepatic Function Panel     Component Value Date/Time   PROT 6.6 03/09/2018 0922   ALBUMIN 4.4 03/09/2018 0922   AST 17 03/09/2018 0922   ALT 20 03/09/2018 0922   ALKPHOS 79 03/09/2018 0922   BILITOT 0.8 03/09/2018 0922      Component Value Date/Time   TSH 2.070 05/25/2017 1147      OBESITY BEHAVIORAL INTERVENTION VISIT  Today's visit was # 19   Starting weight: 325 lbs Starting date: 05/25/17 Today's weight : 199 lbs Today's date: 02/26/2019 Total lbs lost to date: 326    ASK: We discussed the diagnosis of obesity with Georg RuddleJane E Hattabaugh today and Erskine SquibbJane agreed to give us permission to discuss obesity behavioral modification therapy today.  ASSESS: Erskine SquibbJane has the diagnosis of obesity and her BMI today is 32.13 Erskine SquibbJane is in the action stage of change   ADVISE: Erskine SquibbJane was educated on the multiple health risks of obesity as well as the benefit of weight loss to improve her health. She was advised of the need for long term treatment and the importance of lifestyle modifications to improve her current  health and to decrease her risk of future health problems.  AGREE: Multiple dietary modification options and treatment  options were discussed and  Chamya agreed to follow the recommendations documented in the above note.  ARRANGE: Zahari was educated on the importance of frequent visits to treat obesity as outlined per CMS and USPSTF guidelines and agreed to schedule her next follow up appointment today.  Trude Mcburney, am acting as transcriptionist for Helane Rima, DO  I have reviewed the above documentation for accuracy and completeness, and I agree with the above. Helane Rima, DO

## 2019-02-28 ENCOUNTER — Encounter (INDEPENDENT_AMBULATORY_CARE_PROVIDER_SITE_OTHER): Payer: Self-pay | Admitting: Family Medicine

## 2019-03-06 ENCOUNTER — Other Ambulatory Visit: Payer: Self-pay

## 2019-03-06 ENCOUNTER — Ambulatory Visit (INDEPENDENT_AMBULATORY_CARE_PROVIDER_SITE_OTHER): Payer: BC Managed Care – PPO | Admitting: Psychology

## 2019-03-06 DIAGNOSIS — F418 Other specified anxiety disorders: Secondary | ICD-10-CM | POA: Diagnosis not present

## 2019-03-08 ENCOUNTER — Telehealth: Payer: Self-pay | Admitting: Medical Oncology

## 2019-03-08 NOTE — Telephone Encounter (Signed)
COMET Call to patient to verify her menopausal status. Patient confirms that she is postmenopausal. Patient thanked and encouraged to call with questions.  Maxwell Marion, RN, BSN, [facility] Clinical Research 03/08/2019 2:30 PM

## 2019-03-26 ENCOUNTER — Ambulatory Visit (INDEPENDENT_AMBULATORY_CARE_PROVIDER_SITE_OTHER): Payer: BC Managed Care – PPO | Admitting: Family Medicine

## 2019-03-26 ENCOUNTER — Encounter (INDEPENDENT_AMBULATORY_CARE_PROVIDER_SITE_OTHER): Payer: Self-pay | Admitting: Family Medicine

## 2019-03-26 ENCOUNTER — Other Ambulatory Visit: Payer: Self-pay

## 2019-03-26 VITALS — BP 104/52 | HR 68 | Temp 97.5°F | Ht 66.0 in | Wt 196.0 lb

## 2019-03-26 DIAGNOSIS — R0602 Shortness of breath: Secondary | ICD-10-CM

## 2019-03-26 DIAGNOSIS — E669 Obesity, unspecified: Secondary | ICD-10-CM

## 2019-03-26 DIAGNOSIS — E8881 Metabolic syndrome: Secondary | ICD-10-CM

## 2019-03-26 DIAGNOSIS — E559 Vitamin D deficiency, unspecified: Secondary | ICD-10-CM

## 2019-03-26 DIAGNOSIS — Z6831 Body mass index (BMI) 31.0-31.9, adult: Secondary | ICD-10-CM

## 2019-03-27 LAB — COMPREHENSIVE METABOLIC PANEL
ALT: 14 IU/L (ref 0–32)
AST: 17 IU/L (ref 0–40)
Albumin/Globulin Ratio: 2 (ref 1.2–2.2)
Albumin: 4.3 g/dL (ref 3.8–4.8)
Alkaline Phosphatase: 68 IU/L (ref 39–117)
BUN/Creatinine Ratio: 30 — ABNORMAL HIGH (ref 12–28)
BUN: 17 mg/dL (ref 8–27)
Bilirubin Total: 1.1 mg/dL (ref 0.0–1.2)
CO2: 25 mmol/L (ref 20–29)
Calcium: 9.7 mg/dL (ref 8.7–10.3)
Chloride: 107 mmol/L — ABNORMAL HIGH (ref 96–106)
Creatinine, Ser: 0.57 mg/dL (ref 0.57–1.00)
GFR calc Af Amer: 114 mL/min/{1.73_m2} (ref >59)
GFR calc non Af Amer: 99 mL/min/{1.73_m2} (ref >59)
Globulin, Total: 2.1 g/dL (ref 1.5–4.5)
Glucose: 81 mg/dL (ref 65–99)
Potassium: 4.1 mmol/L (ref 3.5–5.2)
Sodium: 145 mmol/L — ABNORMAL HIGH (ref 134–144)
Total Protein: 6.4 g/dL (ref 6.0–8.5)

## 2019-03-27 LAB — LIPID PANEL WITH LDL/HDL RATIO
Cholesterol, Total: 159 mg/dL (ref 100–199)
HDL: 66 mg/dL (ref >39)
LDL Chol Calc (NIH): 81 mg/dL (ref 0–99)
LDL/HDL Ratio: 1.2 ratio (ref 0.0–3.2)
Triglycerides: 59 mg/dL (ref 0–149)
VLDL Cholesterol Cal: 12 mg/dL (ref 5–40)

## 2019-03-27 LAB — HEMOGLOBIN A1C
Est. average glucose Bld gHb Est-mCnc: 103 mg/dL
Hgb A1c MFr Bld: 5.2 % (ref 4.8–5.6)

## 2019-03-27 LAB — INSULIN, RANDOM: INSULIN: 4.4 u[IU]/mL (ref 2.6–24.9)

## 2019-03-27 LAB — VITAMIN D 25 HYDROXY (VIT D DEFICIENCY, FRACTURES): Vit D, 25-Hydroxy: 56.6 ng/mL (ref 30.0–100.0)

## 2019-03-27 NOTE — Progress Notes (Signed)
  Office: [phone]  /  Fax: [phone]    Date: April 03, 2019   Time Seen:10:37am Duration: 34 minutes Provider: Lawerance Cruel, Psy.D. Type of Session: Individual Therapy  Type of Contact: Face-to-face  Session Content: Lindsey [name] is a 64 y.o. female presenting for a follow-up appointment to address the previously established treatment goal of decreasing emotional eating. The session was initiated a brief check-in. Lindsey [name] shared about recent events, including a couple days of eating additional calories. This was explored further and all-or-nothing thinking was reviewed. She also expressed concern about "weight loss coming to an end." Associated thoughts and feeling were processed. This provider also discussed the option of establishing care with another provider for longer-term therapeutic services to address the aforementioned. Lindsey [name] indicated a desire to think about it further, noting she was not opposed to the idea of additional therapeutic services. Session also focused on reflecting on Lindsey [name]'s progress to date and positive reinforcement was provided. Overall, Lindsey [name] was receptive to today's appointment as evidenced by openness to sharing and responsiveness to feedback.  Mental Status Examination:  Appearance: well groomed and appropriate hygiene  Behavior: appropriate to circumstances Mood: euthymic Affect: mood congruent Speech: normal in rate, volume, and tone Eye Contact: appropriate Psychomotor Activity: appropriate Gait: normal Thought Process: linear, logical, and goal directed  Thought Content/Perception: no hallucinations, delusions, bizarre thinking or behavior reported or observed and no evidence of suicidal and homicidal ideation, plan, and intent Orientation: time, person, place and purpose of appointment Memory/Concentration: memory, attention, language, and fund of knowledge intact  Insight/Judgment: good  Interventions:  Conducted a brief chart review Provided empathic  reflections and validation Provided positive reinforcement Employed supportive psychotherapy interventions to facilitate reduced distress and to improve coping skills with identified stressors Employed motivational interviewing skills to assess patient's willingness/desire to adhere to recommended medical treatments and assignments Reviewed all-or-nothing thinking  Discussed option for longer-term therapeutic services  DSM-5 Diagnosis: 300.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 Lindsey [name] demonstrated progress in her goal as evidenced by increased awareness of hunger patterns, increased awareness of triggers for emotional eating and reduction in emotional eating. Lindsey [name] also continues to demonstrate willingness to engage in learned skill(s).  Plan: Lindsey [name] declined future appointments with this provider. She was encouraged to continue engaging in mindfulness. She acknowledged understanding that she may request a follow-up appointment with this provider in the future as long as she is still established with the clinic. No further follow-up planned by this provider.

## 2019-03-27 NOTE — Progress Notes (Signed)
Chief Complaint: OBESITY Lindsey [name] is here to discuss her progress with her obesity treatment plan. Lindsey [name] is keeping a food journal and adhering to recommended goals of 1500-1800 calories and 100 grams of protein and states she is following her eating plan approximately 75% of the time. Lindsey [name] states she is walking 30 minutes 6 times per week.  Today's visit was #: 20  Starting weight: 325 lbs Starting date: 05/25/2017  Today's weight: 196 lbs  Today's date: 03/26/2019 Total lbs lost to date: 129  Total lbs lost since last in-office visit: 3   Subjective:   Interim History: Lindsey [name] continues to do very well with weight loss and has lost another 3 lbs over Christmas and New Year's. She has been frustrated her weight loss has slowed down but, in fact, she has been doing very well overall.  General review of systems is unchanged or negative.   Assessment/Plan:     ICD-10-CM   1. Shortness of breath on exertion  R06.02 Lipid Panel With LDL/HDL Ratio  2. Vitamin D deficiency  E55.9 Vitamin D (25 hydroxy)  3. Insulin resistance  E88.81 Comprehensive Metabolic Panel (CMET)    HgB A1c    Insulin, random    Lipid Panel With LDL/HDL Ratio  4. Class 1 obesity with serious comorbidity and body mass index (BMI) of 31.0 to 31.9 in adult, unspecified obesity type  E66.9    Z68.31    1. Shortness of breath on exertion. Lindsey [name] is walking most days of the week and her shortness of air with exercise is much improved from her first visit when she was greater than 100 lbs heavier.   Repeat IC today shows RMR lower than previous, which is expected, but higher than average.    2. Vitamin D deficiency. Lindsey [name]'s last Vitamin D level was almost at goal. She has lost a large amount of weight last year and is at risk of over replacement.  Lindsey [name] will have labs checked. She will continue OTC Vitamin D for now.    3. Insulin resistance. Lindsey [name] is working on diet and exercise. No hypoglycemia. She is due for  labs.  Lindsey [name] will have labs checked and will continue diet and exercise.   4. Class 1 obesity with serious comorbidity and body mass index (BMI) of 31.0 to 31.9 in adult, unspecified obesity type    TIME SPENT: 32 minutes  Lindsey [name] is currently in the action stage of change. As such, her goal is to continue with weight loss efforts. She has agreed to keep a food journal and adhere to recommended goals of 1500-1800 calories and 100+ grams of protein daily.   We discussed the following exercise goals today: For substantial health benefits, adults should do at least 150 minutes (2 hours and 30 minutes) a week of moderate-intensity, or 75 minutes (1 hour and 15 minutes) a week of vigorous-intensity aerobic physical activity, or an equivalent combination of moderate- and vigorous-intensity aerobic activity. Aerobic activity should be performed in episodes of at least 10 minutes, and preferably, it should be spread throughout the week. Adults should also include muscle-strengthening activities that involve all major muscle groups on 2 or more days a week.   We discussed the following behavioral modification strategies today: no skipping meals and meal planning and cooking strategies.  Lindsey [name] has agreed to follow-up with our clinic in 8 weeks. She was informed of the importance of frequent follow-up visits to maximize her success with intensive lifestyle  modifications for her multiple health conditions.  Objective:   Blood pressure (!) 104/52, pulse 68, temperature (!) 97.5 F (36.4 C), temperature source Oral, height 5\' 6"  (1.676 m), weight 196 lb (88.9 kg), SpO2 98 %. Body mass index is 31.64 kg/m.  General: Cooperative, alert, well developed, in no acute distress. HEENT: Conjunctivae and lids unremarkable. Neck: No thyromegaly.  Cardiovascular: Regular rhythm.  Lungs: Normal work of breathing. Extremities: No edema.  Neurologic: No focal deficits.   Lab Results  Component Value Date    CREATININE 0.57 03/26/2019   BUN 17 03/26/2019   NA 145 (H) 03/26/2019   K 4.1 03/26/2019   CL 107 (H) 03/26/2019   CO2 25 03/26/2019   Lab Results  Component Value Date   ALT 14 03/26/2019   AST 17 03/26/2019   ALKPHOS 68 03/26/2019   BILITOT 1.1 03/26/2019   Lab Results  Component Value Date   HGBA1C 5.2 03/26/2019   HGBA1C 5.4 03/09/2018   HGBA1C 5.5 10/12/2017   HGBA1C 5.7 (H) 05/25/2017   Lab Results  Component Value Date   INSULIN WILL FOLLOW 03/26/2019   INSULIN 10.0 03/09/2018   INSULIN 19.1 10/12/2017   INSULIN 44.2 (H) 05/25/2017   Lab Results  Component Value Date   TSH 2.070 05/25/2017   Lab Results  Component Value Date   CHOL 159 03/26/2019   HDL 66 03/26/2019   LDLCALC 81 03/26/2019   TRIG 59 03/26/2019   Lab Results  Component Value Date   WBC 6.0 05/25/2017   HGB 13.6 05/25/2017   HCT 40.7 05/25/2017   MCV 91 05/25/2017   No results found for: IRON, TIBC, FERRITIN Obesity Behavioral Intervention Visit Documentation for Insurance (15 Minutes):   ASK: We discussed the diagnosis of obesity with Lindsey [name] today and Lindsey [name] agreed to give [place] permission to discuss obesity behavioral modification therapy today.  ASSESS: Lindsey [name] has the diagnosis of obesity and her BMI today is 31.6. Lindsey [name] is in the action stage of change.   ADVISE: Lindsey [name] was educated on the multiple health risks of obesity as well as the benefit of weight loss to improve her health. She was advised of the need for long term treatment and the importance of lifestyle modifications to improve her current health and to decrease her risk of future health problems.  AGREE: Multiple dietary modification options and treatment options were discussed and Lindsey [name] agreed to follow the recommendations documented in the above note.  ARRANGE: Lindsey [name] was educated on the importance of frequent visits to treat obesity as outlined per CMS and USPSTF guidelines and agreed to schedule her next follow up  appointment today.  Attestation Statements:   Reviewed by clinician on day of visit: allergies, medications, problem list, medical history, surgical history, family history, social history and previous encounter notes.  This visit occurred during the SARS-CoV-2 public health emergency. Safety protocols were in place, including screening questions prior to the visit, additional usage of staff PPE, and extensive cleaning of exam room while observing appropriate contact time as indicated for disinfecting solutions. (CPT [id number])  I, Michaelene Song, am acting as transcriptionist for Dennard Nip, MD  I have reviewed the above documentation for accuracy and completeness, and I agree with the above. -  Dennard Nip, MD

## 2019-04-03 ENCOUNTER — Other Ambulatory Visit: Payer: Self-pay

## 2019-04-03 ENCOUNTER — Ambulatory Visit (INDEPENDENT_AMBULATORY_CARE_PROVIDER_SITE_OTHER): Payer: BC Managed Care – PPO | Admitting: Psychology

## 2019-04-03 DIAGNOSIS — F418 Other specified anxiety disorders: Secondary | ICD-10-CM | POA: Diagnosis not present

## 2019-05-28 ENCOUNTER — Ambulatory Visit (INDEPENDENT_AMBULATORY_CARE_PROVIDER_SITE_OTHER): Payer: BC Managed Care – PPO | Admitting: Family Medicine

## 2019-05-28 ENCOUNTER — Other Ambulatory Visit: Payer: Self-pay

## 2019-05-28 ENCOUNTER — Encounter (INDEPENDENT_AMBULATORY_CARE_PROVIDER_SITE_OTHER): Payer: Self-pay | Admitting: Family Medicine

## 2019-05-28 VITALS — BP 122/69 | HR 78 | Temp 98.1°F | Ht 66.0 in | Wt 189.0 lb

## 2019-05-28 DIAGNOSIS — E669 Obesity, unspecified: Secondary | ICD-10-CM

## 2019-05-28 DIAGNOSIS — Z9189 Other specified personal risk factors, not elsewhere classified: Secondary | ICD-10-CM

## 2019-05-28 DIAGNOSIS — Z6830 Body mass index (BMI) 30.0-30.9, adult: Secondary | ICD-10-CM

## 2019-05-28 DIAGNOSIS — E559 Vitamin D deficiency, unspecified: Secondary | ICD-10-CM | POA: Diagnosis not present

## 2019-05-28 NOTE — Progress Notes (Signed)
Chief Complaint:   OBESITY Lindsey [name] is here to discuss her progress with her obesity treatment plan along with follow-up of her obesity related diagnoses. Lindsey [name] is on keeping a food journal and adhering to recommended goals of 1500-1800 calories and 100+ grams of protein daily and states she is following her eating plan approximately 90% of the time. Lindsey [name] states she is walking for 30 minutes 6 times per week.  Today's visit was #: 21 Starting weight: 325 lbs Starting date: 06/04/2017 Today's weight: 189 lbs Today's date: 05/28/2019 Total lbs lost to date: 136 Total lbs lost since last in-office visit: 7  Interim History: Lindsey [name] continues to do well with weight loss and her BMI is close to being below 30. Her hunger is controlled and she os mostly meeting her protein goals, but she is getting bored with her food.  Subjective:   1. Vitamin D deficiency Lindsey [name] is stable on Vit D and her level is now at goal.  2. At risk for dehydration Lindsey [name] is at risk for dehydration due to elevated BUN and creatinine ratio, and weight loss.  Assessment/Plan:   1. Vitamin D deficiency Low Vitamin D level contributes to fatigue and are associated with obesity, breast, and colon cancer. Lindsey [name] agreed to continue taking OTC Vitamin D and will follow-up for routine testing of Vitamin D, at least 2-3 times per year to avoid over-replacement. We will recheck labs in the next 2 months.  2. At risk for dehydration Lindsey [name] was given approximately 15 minutes dehydration prevention counseling today. Lindsey [name] is at risk for dehydration due to weight loss and elevated BUN/creatinine ratio. She was encouraged to hydrate and monitor fluid status to avoid dehydration as well as weight loss plateaus.   3. Class 1 obesity with serious comorbidity and body mass index (BMI) of 30.0 to 30.9 in adult, unspecified obesity type Lindsey [name] is currently in the action stage of change. As such, her goal is to continue with weight loss efforts. She  has agreed to keeping a food journal and adhering to recommended goals of 1500-1800 calories and 100+ grams of protein daily.   Exercise goals: Lindsey [name] is to continue her current exercise regimen as is.  Behavioral modification strategies: increasing lean protein intake and meal planning and cooking strategies.  Lindsey [name] has agreed to follow-up with our clinic in 4 to 5 weeks. She was informed of the importance of frequent follow-up visits to maximize her success with intensive lifestyle modifications for her multiple health conditions.   Objective:   Blood pressure 122/69, pulse 78, temperature 98.1 F (36.7 C), temperature source Oral, height 5\' 6"  (1.676 m), weight 189 lb (85.7 kg), SpO2 98 %. Body mass index is 30.51 kg/m.  General: Cooperative, alert, well developed, in no acute distress. HEENT: Conjunctivae and lids unremarkable. Cardiovascular: Regular rhythm.  Lungs: Normal work of breathing. Neurologic: No focal deficits.   Lab Results  Component Value Date   CREATININE 0.57 03/26/2019   BUN 17 03/26/2019   NA 145 (H) 03/26/2019   K 4.1 03/26/2019   CL 107 (H) 03/26/2019   CO2 25 03/26/2019   Lab Results  Component Value Date   ALT 14 03/26/2019   AST 17 03/26/2019   ALKPHOS 68 03/26/2019   BILITOT 1.1 03/26/2019   Lab Results  Component Value Date   HGBA1C 5.2 03/26/2019   HGBA1C 5.4 03/09/2018   HGBA1C 5.5 10/12/2017   HGBA1C 5.7 (H) 05/25/2017   Lab Results  Component Value Date  INSULIN 4.4 03/26/2019   INSULIN 10.0 03/09/2018   INSULIN 19.1 10/12/2017   INSULIN 44.2 (H) 05/25/2017   Lab Results  Component Value Date   TSH 2.070 05/25/2017   Lab Results  Component Value Date   CHOL 159 03/26/2019   HDL 66 03/26/2019   LDLCALC 81 03/26/2019   TRIG 59 03/26/2019   Lab Results  Component Value Date   WBC 6.0 05/25/2017   HGB 13.6 05/25/2017   HCT 40.7 05/25/2017   MCV 91 05/25/2017   No results found for: IRON, TIBC, FERRITIN  Attestation  Statements:   Reviewed by clinician on day of visit: allergies, medications, problem list, medical history, surgical history, family history, social history, and previous encounter notes.   I, Burt Knack, am acting as transcriptionist for Quillian Quince, MD.  I have reviewed the above documentation for accuracy and completeness, and I agree with the above. -  Quillian Quince, MD

## 2019-06-26 ENCOUNTER — Encounter (INDEPENDENT_AMBULATORY_CARE_PROVIDER_SITE_OTHER): Payer: Self-pay | Admitting: Family Medicine

## 2019-06-26 ENCOUNTER — Other Ambulatory Visit: Payer: Self-pay

## 2019-06-26 ENCOUNTER — Telehealth: Payer: Self-pay | Admitting: Medical Oncology

## 2019-06-26 ENCOUNTER — Ambulatory Visit (INDEPENDENT_AMBULATORY_CARE_PROVIDER_SITE_OTHER): Payer: BC Managed Care – PPO | Admitting: Family Medicine

## 2019-06-26 VITALS — BP 144/82 | HR 78 | Temp 97.9°F | Ht 66.0 in | Wt 187.0 lb

## 2019-06-26 DIAGNOSIS — F418 Other specified anxiety disorders: Secondary | ICD-10-CM | POA: Diagnosis not present

## 2019-06-26 DIAGNOSIS — Z6830 Body mass index (BMI) 30.0-30.9, adult: Secondary | ICD-10-CM

## 2019-06-26 DIAGNOSIS — E66811 Obesity, class 1: Secondary | ICD-10-CM

## 2019-06-26 DIAGNOSIS — E559 Vitamin D deficiency, unspecified: Secondary | ICD-10-CM | POA: Diagnosis not present

## 2019-06-26 DIAGNOSIS — E669 Obesity, unspecified: Secondary | ICD-10-CM | POA: Diagnosis not present

## 2019-06-26 NOTE — Telephone Encounter (Signed)
COMET: Patient called inquiring when her next mammogram is scheduled for. Per study, patient isn't due for mammogram until middle of May, with patient complaining of having some discomfort to the affected breast, as she describes, that comes and goes, scheduling sooner. Per patient, she palpated the area and "doesn't feel anything there." I informed patient that since she is having discomfort, we will get her scheduled for a mammogram sooner. Patient states that she had her 1st COVID-19 vaccine on March 23 and her second shot is scheduled for April 13. Due to the possibility of the shot causing an inflammation to the lymph nodes, the suggestion was made to have her mammogram done the day before her second shot is due (4/12). Patient knows that she will be scheduled to see MD after her mammogram.  Patient was encouraged to call me if she has not heard back from [facility] by Thursday, April 8th, for an appointment. Patient was thanked and informed that a message will be sent to Dr. Pamelia Hoit for mammogram orders to be placed. Patient thanked and encouraged to call with questions in the meantime.  Rexene Edison, RN, BSN, [facility] Clinical [facility] 06/26/2019 4:33 PM

## 2019-06-26 NOTE — Progress Notes (Signed)
Chief Complaint:   OBESITY Lindsey [name] is here to discuss her progress with her obesity treatment plan along with follow-up of her obesity related diagnoses. Lindsey [name] is on keeping a food journal and adhering to recommended goals of 1500-1800 calories and 100+ grams of protein daily and states she is following her eating plan approximately 90% of the time. Lindsey [name] states she is walking for 30 minutes 6 times per week.  Today's visit was #: 22 Starting weight: 325 lbs Starting date: 06/04/2017 Today's weight: 187 lbs Today's date: 06/26/2019 Total lbs lost to date: 138  Total lbs lost since last in-office visit: 2  Interim History: Lindsey [name] notes if she deviates from the plan she will crave simple carbohydrate snacks. She will have her second COVID-19 vaccine on 07/03/2019. She will have her steroid injection in her right knee at the end of the month.  Subjective:   1. Vitamin D deficiency Thaila's Vit D level on 03/26/2019 was 56.6. She is currently on Vit D 5,000 IU q daily.  2. Other specified anxiety disorders Lindsey [name] is not on SSRI/SNRI. She reports increased life stressors from upcoming mammogram, knee pain, filing taxes, and her roof needing repairs. I encouraged her to call Oncology/mamnography center to schedule imaging.  Assessment/Plan:   1. Vitamin D deficiency Low Vitamin D level contributes to fatigue and are associated with obesity, breast, and colon cancer. Lindsey [name] will follow-up for routine testing of Vitamin D, at least 2-3 times per year to avoid over-replacement. We will check labs today.  - VITAMIN D 25 Hydroxy (Vit-D Deficiency, Fractures)  2. Other specified anxiety disorders Behavior modification techniques were discussed today to help Lindsey [name] deal with her anxiety. She declined appointment with Dr. Mallie Mussel, our Bariatric Psychologist. Orders and follow up as documented in patient record.   3. Class 1 obesity with serious comorbidity and body mass index (BMI) of 30.0 to 30.9 in adult,  unspecified obesity type Anmarie is currently in the action stage of change. As such, her goal is to continue with weight loss efforts. She has agreed to keeping a food journal and adhering to recommended goals of 1500-1800 calories and 100+ grams of protein daily.   Exercise goals: As is.  Behavioral modification strategies: decreasing simple carbohydrates and better snacking choices.  Lindsey [name] has agreed to follow-up with our clinic in 4 to 5 weeks. She was informed of the importance of frequent follow-up visits to maximize her success with intensive lifestyle modifications for her multiple health conditions.   Objective:   Blood pressure (!) 144/82, pulse 78, temperature 97.9 F (36.6 C), temperature source Oral, height 5\' 6"  (1.676 m), weight 187 lb (84.8 kg), SpO2 100 %. Body mass index is 30.18 kg/m.  General: Cooperative, alert, well developed, in no acute distress. HEENT: Conjunctivae and lids unremarkable. Cardiovascular: Regular rhythm.  Lungs: Normal work of breathing. Neurologic: No focal deficits.   Lab Results  Component Value Date   CREATININE 0.57 03/26/2019   BUN 17 03/26/2019   NA 145 (H) 03/26/2019   K 4.1 03/26/2019   CL 107 (H) 03/26/2019   CO2 25 03/26/2019   Lab Results  Component Value Date   ALT 14 03/26/2019   AST 17 03/26/2019   ALKPHOS 68 03/26/2019   BILITOT 1.1 03/26/2019   Lab Results  Component Value Date   HGBA1C 5.2 03/26/2019   HGBA1C 5.4 03/09/2018   HGBA1C 5.5 10/12/2017   HGBA1C 5.7 (H) 05/25/2017   Lab Results  Component Value Date  INSULIN 4.4 03/26/2019   INSULIN 10.0 03/09/2018   INSULIN 19.1 10/12/2017   INSULIN 44.2 (H) 05/25/2017   Lab Results  Component Value Date   TSH 2.070 05/25/2017   Lab Results  Component Value Date   CHOL 159 03/26/2019   HDL 66 03/26/2019   LDLCALC 81 03/26/2019   TRIG 59 03/26/2019   Lab Results  Component Value Date   WBC 6.0 05/25/2017   HGB 13.6 05/25/2017   HCT 40.7 05/25/2017    MCV 91 05/25/2017   No results found for: IRON, TIBC, FERRITIN  Attestation Statements:   Reviewed by clinician on day of visit: allergies, medications, problem list, medical history, surgical history, family history, social history, and previous encounter notes.   I, Burt Knack, am acting as transcriptionist for Quillian Quince, MD.  I have reviewed the above documentation for accuracy and completeness, and I agree with the above. -  Quillian Quince, MD

## 2019-06-27 LAB — VITAMIN D 25 HYDROXY (VIT D DEFICIENCY, FRACTURES): Vit D, 25-Hydroxy: 64.7 ng/mL (ref 30.0–100.0)

## 2019-07-02 ENCOUNTER — Telehealth: Payer: Self-pay | Admitting: Medical Oncology

## 2019-07-02 NOTE — Telephone Encounter (Signed)
COMET: Return call received from patient. Patient informed me she has a scheduled mammogram for tomorrow morning @ 0800. Patient to be scheduled to be seen by Dr. Pamelia Hoit for clinic visit on 07/04/19 at 1:30, patient confirms appointment.  Patient thanked and encouraged to call in the meantime.  Rexene Edison, RN, BSN, [facility] Clinical Research 07/02/2019 4:19 PM

## 2019-07-02 NOTE — Telephone Encounter (Signed)
LVMOM with patient inquiring as to whether she was able to schedule a mammogram, so that I can schedule an MD visit. Asked patient to return call. Call back information provided.

## 2019-07-03 NOTE — Progress Notes (Signed)
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 Biopsy showed low-grade DCIS, ER+ 100%, PR+ 100%.      CHIEF COMPLIANT: Follow-up of left breast DCIS  INTERVAL HISTORY: Lindsey [name] is a 64 y.o. with above-mentioned history of left breast DCIS. She is a participant in the COMET clinical trial and was randomized to the active surveillance arm. She had a mammogram done on 07/03/19 after reporting left breast discomfort. She presents to the clinic today to review her recent mammogram.  She had mild tenderness in the breast at one spot.  She thinks it could be in her mind.  Overall there is no pain or tenderness.  ALLERGIES:  is allergic to lactose intolerance (gi); sulfa antibiotics; and amoxicillin.  MEDICATIONS:  Current Outpatient Medications  Medication Sig Dispense Refill  . Cholecalciferol (VITAMIN D3) 125 MCG (5000 UT) CAPS Take 1 capsule by mouth daily.    . fluticasone (FLONASE) 50 MCG/ACT nasal spray Place 1 spray into both nostrils daily.  2  . Glucosamine-Chondroitin (GLUCOSAMINE CHONDR COMPLEX PO) Take by mouth.    . Multiple Vitamins-Minerals (WOMENS MULTIVITAMIN PLUS PO) Take 1 tablet by mouth daily.    . Turmeric 500 MG CAPS Take 1 capsule by mouth daily.     No current facility-administered medications for this visit.    PHYSICAL EXAMINATION: ECOG PERFORMANCE STATUS: 1 - Symptomatic but completely ambulatory  Vitals:   07/04/19 1359  BP: 115/76  Pulse: 62  Resp: 16  Temp: 99.1 F (37.3 C)  SpO2: 100%   Filed Weights   07/04/19 1359  Weight: 189 lb 12.8 oz (86.1 kg)    BREAST: No palpable masses or nodules in either right or left breasts. No  palpable axillary supraclavicular or infraclavicular adenopathy no breast tenderness or nipple discharge. (exam performed in the presence of a chaperone)  LABORATORY DATA:  I have reviewed the data as listed CMP Latest Ref Rng & Units 03/26/2019 03/09/2018 10/12/2017  Glucose 65 - 99 mg/dL 81 92 92  BUN 8 - 27 mg/dL 17 18 14   Creatinine 0.57 - 1.00 mg/dL 0.57 0.75 0.71  Sodium 134 - 144 mmol/L 145(H) 141 143  Potassium 3.5 - 5.2 mmol/L 4.1 4.7 4.3  Chloride 96 - 106 mmol/L 107(H) 102 100  CO2 20 - 29 mmol/L 25 26 25   Calcium 8.7 - 10.3 mg/dL 9.7 9.7 9.6  Total Protein 6.0 - 8.5 g/dL 6.4 6.6 6.7  Total Bilirubin 0.0 - 1.2 mg/dL 1.1 0.8 1.1  Alkaline Phos 39 - 117 IU/L 68 79 74  AST 0 - 40 IU/L 17 17 21   ALT 0 - 32 IU/L 14 20 29     Lab Results  Component Value Date   WBC 6.0 05/25/2017   HGB 13.6 05/25/2017   HCT 40.7 05/25/2017   MCV 91 05/25/2017   NEUTROABS 3.3 05/25/2017    ASSESSMENT & PLAN:  Ductal carcinoma in situ (DCIS) of left breast 11/21/2018: Screening detected left breast mammogram revealing calcifications: Biopsy revealed low-grade DCIS ER 100%, PR 100% Tis NX stage 0  Treatment plan: Comet clinical trial participation (active monitoring arm) Patient is here today complaining of breast discomfort. Mammogram  and ultrasound at [facility] 07/03/2019: No mammographic abnormality left breast to correspond with the pain.  Ultrasound is recommended.  3 mm area of grouped calcifications left breast consistent with DCIS unchanged.  Current treatment: Patient decided not to take tamoxifen therapy. We once again discussed the pros and cons of tamoxifen therapy. If she decides to take tamoxifen we will start her at 5 mg dose.  She will call us and inform us of her decision. She is very nervous about the fact that she is now taking tamoxifen.  Her relative is currently dying with metastatic breast cancer.  We decided to perform a bone density test and bilateral mammograms in 6 months  at [facility]. Return to clinic in 6 months after the mammograms for follow-up.  No orders of the defined types were placed in this encounter.  The patient has a good understanding of the overall plan. she agrees with it. she will call with any problems that may develop before the next visit here.  Total time spent: 20 mins including face to face time and time spent for planning, charting and coordination of care  Serena Croissant, MD 07/04/2019  I, Kirt Boys Dorshimer, am acting as scribe for Dr. Serena Croissant.  I have reviewed the above documentation for accuracy and completeness, and I agree with the above.

## 2019-07-04 ENCOUNTER — Encounter: Payer: Self-pay | Admitting: Medical Oncology

## 2019-07-04 ENCOUNTER — Other Ambulatory Visit: Payer: Self-pay

## 2019-07-04 ENCOUNTER — Inpatient Hospital Stay: Payer: BC Managed Care – PPO | Attending: Hematology and Oncology | Admitting: Hematology and Oncology

## 2019-07-04 VITALS — BP 115/76 | HR 62 | Temp 99.1°F | Resp 16 | Ht 66.0 in | Wt 189.8 lb

## 2019-07-04 DIAGNOSIS — D0512 Intraductal carcinoma in situ of left breast: Secondary | ICD-10-CM | POA: Diagnosis not present

## 2019-07-04 DIAGNOSIS — Z170 Estrogen receptor positive status [ER+]: Secondary | ICD-10-CM | POA: Diagnosis not present

## 2019-07-04 DIAGNOSIS — Z780 Asymptomatic menopausal state: Secondary | ICD-10-CM

## 2019-07-04 DIAGNOSIS — Z006 Encounter for examination for normal comparison and control in clinical research program: Secondary | ICD-10-CM

## 2019-07-04 DIAGNOSIS — Z79810 Long term (current) use of selective estrogen receptor modulators (SERMs): Secondary | ICD-10-CM

## 2019-07-04 NOTE — Assessment & Plan Note (Signed)
11/21/2018: Screening detected left breast mammogram revealing calcifications: Biopsy revealed low-grade DCIS ER 100%, PR 100% Tis NX stage 0  Treatment plan: Comet clinical trial participation (active monitoring arm) Patient is here today complaining of breast tenderness and discomfort. Because of recent Covid vaccine we decided to hold off on obtaining diagnostic mammogram immediately.  Current treatment: Patient decided not to take tamoxifen therapy.

## 2019-07-04 NOTE — Progress Notes (Signed)
COMET: Active Surveillance:  98-monthfollow-up I met with patient, here alone, in exam room for her 6 months on study follow-up. Patient had her mammogram yesterday (4/13) and is here for MD review and study assessments. Patient's mammogram was completed earlier than expected, by study date range, due to patient having some "discomfort" to the affected breast. Patient has decided not to be on any endocrine therapy (ET) since diagnosis. Patient had questions regarding ET today and Dr. GLindi Adiereviewed this with her again. Per patient, she will consider it and call office, should she decided to start any ET.  H&P: MD reviewed results of unilateral mammogram with patient. MD also answered any questions/concerns patient had.  Questionnaire: patient denies receiving any questionnaires as of today. Patient elected to receive questionnaires in paper form and I informed her that she should be receiving these shortly, to expect them, and to complete as soon as she can and place them in the mail using the postage paid envelope provided. Patient gave her verbal understanding to this.  Study Solicited Adverse Events: Patient denies having any new or worsening issues and no hospital admissions.  Plan: Patient informed that next study visit will be in approximately six months time (one year on study), after having a bilateral mammogram, to be seen in clinic. Patient thanked for her continued support of study and was encouraged to call with any questions or concerns she may have in the meantime.   MMaxwell Marion RN, BSN, [facility] Clinical Research 07/04/2019 3:00 PM  JANGELICA [id number]  Adverse Event Log  Study/Protocol: COMET Cycle: Baseline - 6 months  Event Grade Onset Date Resolved Date Drug Name Attribution Treatment Comments  Hot flashes 1 Baseline resolved N/A History of None   Hypertension 2 Baseline 07/04/19 N/A History of None   Pain- bilateral knees 1 Baseline ongoing N/A History of Conmed    alopecia 1 Baseline ongoing N/A History of  None    multiple stiff joints 1` Baseline ongoing N/A History of None

## 2019-07-10 ENCOUNTER — Telehealth: Payer: Self-pay | Admitting: Hematology and Oncology

## 2019-07-10 NOTE — Telephone Encounter (Signed)
Scheduled per 04/14 los, patient has been called and notified of upcoming appointments. 

## 2019-07-31 ENCOUNTER — Encounter (INDEPENDENT_AMBULATORY_CARE_PROVIDER_SITE_OTHER): Payer: Self-pay | Admitting: Family Medicine

## 2019-07-31 ENCOUNTER — Ambulatory Visit (INDEPENDENT_AMBULATORY_CARE_PROVIDER_SITE_OTHER): Payer: BC Managed Care – PPO | Admitting: Family Medicine

## 2019-07-31 ENCOUNTER — Other Ambulatory Visit: Payer: Self-pay

## 2019-07-31 VITALS — BP 114/78 | HR 77 | Temp 98.2°F | Ht 66.0 in | Wt 188.0 lb

## 2019-07-31 DIAGNOSIS — Z9189 Other specified personal risk factors, not elsewhere classified: Secondary | ICD-10-CM | POA: Diagnosis not present

## 2019-07-31 DIAGNOSIS — E669 Obesity, unspecified: Secondary | ICD-10-CM

## 2019-07-31 DIAGNOSIS — E559 Vitamin D deficiency, unspecified: Secondary | ICD-10-CM | POA: Diagnosis not present

## 2019-07-31 DIAGNOSIS — R5381 Other malaise: Secondary | ICD-10-CM

## 2019-07-31 DIAGNOSIS — I10 Essential (primary) hypertension: Secondary | ICD-10-CM

## 2019-07-31 DIAGNOSIS — Z6830 Body mass index (BMI) 30.0-30.9, adult: Secondary | ICD-10-CM

## 2019-07-31 NOTE — Progress Notes (Signed)
Chief Complaint:   OBESITY Lindsey [name] is here to discuss her progress with her obesity treatment plan along with follow-up of her obesity related diagnoses. Lindsey [name] is on keeping a food journal and adhering to recommended goals of 1500-1800 calories and 100+ grams of protein daily and states she is following her eating plan approximately 80% of the time. Lindsey [name] states she is walking for 45 minutes 6 times per week.  Today's visit was #: 23 Starting weight: 325 lbs Starting date: 06/04/2017 Today's weight: 188 lbs Today's date: 07/31/2019 Total lbs lost to date: 137 Total lbs lost since last in-office visit: 0  Interim History: Lindsey [name] reports several days with calorie consumption >2500 per day. She reports dramatic increase in carbohydrate cravings and emotional eating, despite denying outside influences to increase depression.  Subjective:   1. Vitamin D deficiency Lindsey [name]'s Vit D level on 06/26/2019 was 64.7. She is currently on OTC Vit D 5,000 IU q daily.  2. Essential hypertension Lindsey [name]'s blood pressure is well controlled at her office visit today.  3. Physical deconditioning Lindsey [name] is able to walk her dog, however she has been challenged to weight training and gaining muscle. She is agreeable to referral to physical therapy.  4. At risk for activity intolerance Lindsey [name] is at risk for exercise intolerance due to physical deconditioning.  Assessment/Plan:   1. Vitamin D deficiency Low Vitamin D level contributes to fatigue and are associated with obesity, breast, and colon cancer. Lindsey [name] will follow-up for routine testing of Vitamin D, at least 2-3 times per year to avoid over-replacement. We will recheck labs in July 2021.  2. Essential hypertension Lindsey [name] will continue to control with diet and regular walking, and will work on healthy weight loss and exercise to improve blood pressure control. We will watch for signs of hypotension as she continues her lifestyle modifications.  3. Physical  deconditioning We will refer to physical therapy, and will follow up.  4. At risk for activity intolerance Lindsey [name] was given approximately 30 minutes of exercise intolerance counseling today. She is 64 y.o. female and has risk factors exercise intolerance including obesity. We discussed intensive lifestyle modifications today with an emphasis on specific weight loss instructions and strategies. Lindsey [name] will slowly increase activity as tolerated.  Repetitive spaced learning was employed today to elicit superior memory formation and behavioral change.  5. Class 1 obesity with serious comorbidity and body mass index (BMI) of 30.0 to 30.9 in adult, unspecified obesity type Lindsey [name] is currently in the action stage of change. As such, her goal is to continue with weight loss efforts. She has agreed to keeping a food journal and adhering to recommended goals of 1500-1800 calories and 100+ grams of protein daily.   Exercise goals: As is.  Behavioral modification strategies: better snacking choices and emotional eating strategies.  Lindsey [name] has agreed to follow-up with our clinic in 3 weeks. She was informed of the importance of frequent follow-up visits to maximize her success with intensive lifestyle modifications for her multiple health conditions.   Objective:   Blood pressure 114/78, pulse 77, temperature 98.2 F (36.8 C), temperature source Oral, height 5\' 6"  (1.676 m), weight 188 lb (85.3 kg), SpO2 95 %. Body mass index is 30.34 kg/m.  General: Cooperative, alert, well developed, in no acute distress. HEENT: Conjunctivae and lids unremarkable. Cardiovascular: Regular rhythm.  Lungs: Normal work of breathing. Neurologic: No focal deficits.   Lab Results  Component Value Date   CREATININE 0.57 03/26/2019   BUN 17  03/26/2019   NA 145 (H) 03/26/2019   K 4.1 03/26/2019   CL 107 (H) 03/26/2019   CO2 25 03/26/2019   Lab Results  Component Value Date   ALT 14 03/26/2019   AST 17 03/26/2019    ALKPHOS 68 03/26/2019   BILITOT 1.1 03/26/2019   Lab Results  Component Value Date   HGBA1C 5.2 03/26/2019   HGBA1C 5.4 03/09/2018   HGBA1C 5.5 10/12/2017   HGBA1C 5.7 (H) 05/25/2017   Lab Results  Component Value Date   INSULIN 4.4 03/26/2019   INSULIN 10.0 03/09/2018   INSULIN 19.1 10/12/2017   INSULIN 44.2 (H) 05/25/2017   Lab Results  Component Value Date   TSH 2.070 05/25/2017   Lab Results  Component Value Date   CHOL 159 03/26/2019   HDL 66 03/26/2019   LDLCALC 81 03/26/2019   TRIG 59 03/26/2019   Lab Results  Component Value Date   WBC 6.0 05/25/2017   HGB 13.6 05/25/2017   HCT 40.7 05/25/2017   MCV 91 05/25/2017   No results found for: IRON, TIBC, FERRITIN  Attestation Statements:   Reviewed by clinician on day of visit: allergies, medications, problem list, medical history, surgical history, family history, social history, and previous encounter notes.   I, Trixie Dredge, am acting as transcriptionist for Dennard Nip, MD.  I have reviewed the above documentation for accuracy and completeness, and I agree with the above. -  Dennard Nip, MD

## 2019-08-27 ENCOUNTER — Encounter (INDEPENDENT_AMBULATORY_CARE_PROVIDER_SITE_OTHER): Payer: Self-pay | Admitting: Family Medicine

## 2019-08-27 ENCOUNTER — Ambulatory Visit (INDEPENDENT_AMBULATORY_CARE_PROVIDER_SITE_OTHER): Payer: BC Managed Care – PPO | Admitting: Family Medicine

## 2019-08-27 ENCOUNTER — Other Ambulatory Visit: Payer: Self-pay

## 2019-08-27 VITALS — BP 101/67 | HR 64 | Temp 98.0°F | Ht 66.0 in | Wt 191.0 lb

## 2019-08-27 DIAGNOSIS — F3289 Other specified depressive episodes: Secondary | ICD-10-CM

## 2019-08-27 DIAGNOSIS — Z6830 Body mass index (BMI) 30.0-30.9, adult: Secondary | ICD-10-CM

## 2019-08-27 DIAGNOSIS — E669 Obesity, unspecified: Secondary | ICD-10-CM | POA: Diagnosis not present

## 2019-08-27 MED ORDER — BUPROPION HCL ER (SR) 150 MG PO TB12: 150.0000 mg | ORAL_TABLET | Freq: Every day | ORAL | 0 refills | Status: DC

## 2019-08-27 NOTE — Progress Notes (Signed)
Chief Complaint:   OBESITY Lindsey [name] is here to discuss her progress with her obesity treatment plan along with follow-up of her obesity related diagnoses. Lindsey [name] is on keeping a food journal and adhering to recommended goals of 1500-1800 calories and 100+ grams of protein daily and states she is following her eating plan approximately 50% of the time. Lindsey [name] states she is walking for 30-40 minutes 6 times per week.  Today's visit was #: 24 Starting weight: 325 lbs Starting date: 06/04/2017 Today's weight: 191 lbs Today's date: 08/27/2019 Total lbs lost to date: 134  Total lbs lost since last in-office visit: 0  Interim History: Lindsey [name] has been struggling more with weight loss in the last few weeks. She notes increased PM hunger but she is still trying to keep a journal.  Subjective:   1. Other depression with emotional eating Lindsey [name] still struggles with emotional eating behaviors and she notes some binge eating episodes. She has seen Dr. Mallie Mussel in the past.  Assessment/Plan:   1. Other depression with emotional eating Behavior modification techniques were discussed today to help Lindsey [name] deal with her emotional/non-hunger eating behaviors. Lindsey [name] agreed to start Wellbutrin SR 150 mg q AM with no refills. She is to schedule a follow up with Dr. Mallie Mussel. Orders and follow up as documented in patient record.   - buPROPion (WELLBUTRIN SR) 150 MG 12 hr tablet; Take 1 tablet (150 mg total) by mouth daily.  Dispense: 30 tablet; Refill: 0  2. Class 1 obesity with serious comorbidity and body mass index (BMI) of 30.0 to 30.9 in adult, unspecified obesity type Lindsey [name] is currently in the action stage of change. As such, her goal is to continue with weight loss efforts. She has agreed to keeping a food journal and adhering to recommended goals of 1500-1800 calories and 100+ grams of protein daily.   Exercise goals: As is.  Behavioral modification strategies: increasing lean protein intake.  Lindsey [name] has agreed to  follow-up with our clinic in 3 to 4 weeks. She was informed of the importance of frequent follow-up visits to maximize her success with intensive lifestyle modifications for her multiple health conditions.   Objective:   Blood pressure 101/67, pulse 64, temperature 98 F (36.7 C), temperature source Oral, height 5\' 6"  (1.676 m), weight 191 lb (86.6 kg), SpO2 98 %. Body mass index is 30.83 kg/m.  General: Cooperative, alert, well developed, in no acute distress. HEENT: Conjunctivae and lids unremarkable. Cardiovascular: Regular rhythm.  Lungs: Normal work of breathing. Neurologic: No focal deficits.   Lab Results  Component Value Date   CREATININE 0.57 03/26/2019   BUN 17 03/26/2019   NA 145 (H) 03/26/2019   K 4.1 03/26/2019   CL 107 (H) 03/26/2019   CO2 25 03/26/2019   Lab Results  Component Value Date   ALT 14 03/26/2019   AST 17 03/26/2019   ALKPHOS 68 03/26/2019   BILITOT 1.1 03/26/2019   Lab Results  Component Value Date   HGBA1C 5.2 03/26/2019   HGBA1C 5.4 03/09/2018   HGBA1C 5.5 10/12/2017   HGBA1C 5.7 (H) 05/25/2017   Lab Results  Component Value Date   INSULIN 4.4 03/26/2019   INSULIN 10.0 03/09/2018   INSULIN 19.1 10/12/2017   INSULIN 44.2 (H) 05/25/2017   Lab Results  Component Value Date   TSH 2.070 05/25/2017   Lab Results  Component Value Date   CHOL 159 03/26/2019   HDL 66 03/26/2019   LDLCALC 81 03/26/2019   TRIG  59 03/26/2019   Lab Results  Component Value Date   WBC 6.0 05/25/2017   HGB 13.6 05/25/2017   HCT 40.7 05/25/2017   MCV 91 05/25/2017   No results found for: IRON, TIBC, FERRITIN  Attestation Statements:   Reviewed by clinician on day of visit: allergies, medications, problem list, medical history, surgical history, family history, social history, and previous encounter notes.  Time spent on visit including pre-visit chart review and post-visit care and charting was 32 minutes.    I, Burt Knack, am acting as  transcriptionist for Quillian Quince, MD.  I have reviewed the above documentation for accuracy and completeness, and I agree with the above. -  Quillian Quince, MD

## 2019-09-04 NOTE — Addendum Note (Signed)
Addended by: Drema Pry L on: 09/04/2019 03:28 PM   Modules accepted: Orders

## 2019-09-17 ENCOUNTER — Other Ambulatory Visit: Payer: Self-pay | Admitting: Family Medicine

## 2019-09-17 DIAGNOSIS — R102 Pelvic and perineal pain: Secondary | ICD-10-CM

## 2019-09-20 ENCOUNTER — Encounter: Payer: Self-pay | Admitting: Physical Therapy

## 2019-09-20 ENCOUNTER — Other Ambulatory Visit: Payer: Self-pay

## 2019-09-20 ENCOUNTER — Ambulatory Visit: Payer: BC Managed Care – PPO | Attending: Family Medicine | Admitting: Physical Therapy

## 2019-09-20 DIAGNOSIS — M6281 Muscle weakness (generalized): Secondary | ICD-10-CM

## 2019-09-20 NOTE — Therapy (Signed)
[facility] [address] [place], [place], [address] Phone: [phone]   Fax:  [phone]  Physical Therapy Evaluation  Patient Details  Name: Lindsey RuddleJane E [name] MRN: [id number] Date of Birth: 01/20/1956 Referring Provider (PT): Wilder GladeBeasley, Caren D, MD   Encounter Date: 09/20/2019   PT End of Session - 09/20/19 1716    Visit Number 1    Number of Visits 4    Date for PT Re-Evaluation 11/15/19    Authorization Type [employer]    PT Start Time 1400    PT Stop Time 1445    PT Time Calculation (min) 45 min    Activity Tolerance Patient tolerated treatment well    Behavior During Therapy [facility] for tasks assessed/performed           Past Medical History:  Diagnosis Date  . Dry skin   . H/O seasonal allergies   . Hair loss   . Knee pain   . Multiple stiff joints   . Nasal congestion   . Skin tag   . Snoring   . Urgency of urination     Past Surgical History:  Procedure Laterality Date  . FOOT SURGERY    . TONSILLECTOMY      There were no vitals filed for this visit.    Subjective Assessment - 09/20/19 1352    Subjective Patient reports she is trying to lose weight and also build muscle mass. She does report occasional right knee pain and previous injuries with the right ankle that causes it to pronate and she is not able to correct it. She does report limitation in her knee range of motion and diffiuclty with kneeling. Patient also reports severe episodes of cramping. Mainly she wants to work on getting stronger to help lose weight.    How long can you sit comfortably? No limitation    How long can you stand comfortably? No limitation    How long can you walk comfortably? No limitation    Diagnostic tests None    Patient Stated Goals Improve muscle mass to help her lose weight    Currently in Pain? No/denies              [facility] 09/20/19 0001      Assessment   Medical Diagnosis Physical deconditioning     Referring Provider (PT) Quillian QuinceBeasley, Caren D, MD    Onset Date/Surgical Date --   patient reports this has been going on for years   Next MD Visit 09/26/2019    Prior Therapy None      Precautions   Precautions None      Restrictions   Weight Bearing Restrictions No      Balance Screen   Has the patient fallen in the past 6 months No    Has the patient had a decrease in activity level because of a fear of falling?  No    Is the patient reluctant to leave their home because of a fear of falling?  No      Home [employer] managernvironment   Living Environment Private residence      Prior Function   Level of Independence Independent    Leisure Walking      Cognition   Overall Cognitive Status Within Functional Limits for tasks assessed      Observation/Other Assessments   Observations Patient appears in no apparent distress    Focus on Therapeutic Outcomes (FOTO)  NA      Coordination  Gross Motor Movements are Fluid and Coordinated Yes      Posture/Postural Control   Posture Comments Patient exhibits rounded shoulder posture      ROM / Strength   AROM / PROM / Strength AROM;Strength      AROM   Overall AROM Comments Patient exhibits AROM grossly WFL for UE and LE, slight deficit for right knee      Strength   Strength Assessment Site Shoulder;Elbow;Hip;Knee;Ankle    Right/Left Shoulder Right;Left    Right Shoulder Flexion 4/5    Right Shoulder ABduction 4/5    Right Shoulder Internal Rotation 4/5    Right Shoulder External Rotation 4/5    Left Shoulder Flexion 4/5    Left Shoulder ABduction 4/5    Left Shoulder Internal Rotation 4/5    Left Shoulder External Rotation 4/5    Right/Left Elbow Right;Left    Right Elbow Flexion 4+/5    Right Elbow Extension 4+/5    Left Elbow Flexion 4+/5    Left Elbow Extension 4+/5    Right/Left Hip Right;Left    Right Hip Flexion 4/5    Right Hip Extension 3+/5    Right Hip ABduction 3+/5    Left Hip Flexion 4/5    Left Hip Extension 3+/5     Left Hip ABduction 3+/5    Right/Left Knee Right;Left    Right Knee Flexion 4/5    Right Knee Extension 4+/5    Left Knee Flexion 4/5    Left Knee Extension 4+/5    Right/Left Ankle Right;Left    Right Ankle Dorsiflexion 5/5    Right Ankle Plantar Flexion 4/5    Left Ankle Dorsiflexion 5/5    Left Ankle Plantar Flexion 4/5      Palpation   Palpation comment Non-TTP      Special Tests   Other special tests Not assessed      Transfers   Transfers Independent with all Transfers                      Objective measurements completed on examination: See above findings.       [facility] PT Treatment/Exercise - 09/20/19 0001      Exercises   Exercises Knee/Hip;Shoulder   discussed [employer] gym equipment     Knee/Hip Exercises: Machines for Strengthening   Cybex Knee Extension 15# 3x10    Cybex Knee Flexion 20# 3x10    Cybex Leg Press 45# 3x10      Shoulder Exercises: ROM/Strengthening   Lat Pull Limitations 15# 3x10    Cybex Press Limitations 15# 3x10    Cybex Row Limitations 15# 3x10                  PT Education - 09/20/19 1715    Education Details POC, using weight machines at gym to add resistance in order to build strength and muscle mass    Person(s) Educated Patient    Methods Explanation;Demonstration;Verbal cues;Tactile cues;Handout    Comprehension Returned demonstration;Verbalized understanding;Verbal cues required;Tactile cues required;Need further instruction            PT Short Term Goals - 09/20/19 1720      PT SHORT TERM GOAL #1   Title STG=LTG             PT Long Term Goals - 09/20/19 1716      PT LONG TERM GOAL #1   Title Patient will report independence with gym machine based exercise program  to promote strength    Time 8    Period Weeks    Status New    Target Date 11/15/19      PT LONG TERM GOAL #2   Title Patient will exhibit improved bilateral knee strength = 5/5 MMT and bilateral hip strength >/= 4/5  MMT to improve ability to exercise and lose weight    Time 8    Period Weeks    Status New    Target Date 11/15/19      PT LONG TERM GOAL #3   Title Patient will exhibit gross bilateral shoulder strength >/= 4+/5 MMT to allow for improved lifting ability and postural endurance    Time 8    Period Weeks    Status New    Target Date 11/15/19                  Plan - 09/20/19 1721    Clinical Impression Statement Patient presents to PT with report of weakness and decreased muscle mass that limits her ability to continue weight loss program. Patient does exhibit gross strength deficit of bilateral UE and LE, and she does report right knee limitations, but overall is not limited by orthopedic conditions and is mainly looking to improve exercise ability. She does have a gym membership so she was instructed on various machine based exercises, including technique and proper set/reps with appropriate weight, and provided a handout that would assist her while at the gym. She was encouraged to go to the gym 3x/week and seek advice from gym staff on exercise equipment as this would be the most effective way to build muscle strength and mass to assist her in losing weight. Patient was instructed to follow-up with PT if she feels limited with exercise due to right knee and her POC will be open for 8 weeks in order to give her time to perform gym exercise consistently and create a routine.    Personal Factors and Comorbidities Fitness;Time since onset of injury/illness/exacerbation    Examination-Activity Limitations Lift;Squat    Examination-Participation Restrictions Other   Gym exercise   Stability/Clinical Decision Making Stable/Uncomplicated    Clinical Decision Making Low    Rehab Potential Excellent    PT Frequency Other (comment)   patient instructed to follow-up as needed in 8 week period   PT Duration 8 weeks    PT Treatment/Interventions Therapeutic exercise;ADLs/Self Care Home  Management;Neuromuscular re-education;Patient/family education;Dry needling;Passive range of motion;Manual techniques;Therapeutic activities    PT Next Visit Plan Cotninued right knee assessment, progress generalized LE and UE strength, work on gym based exercises so she can perform outside of clinic    PT Home Exercise Plan Patient instructed on following gym equipment: Leg press, knee extension machine, knee flexion machine, hip abduciton machine, row machine, lat pull down machine, chest press machine, bicep curl machine, tricep push down machine    Consulted and Agree with Plan of Care Patient           Patient will benefit from skilled therapeutic intervention in order to improve the following deficits and impairments:  Decreased strength, Decreased activity tolerance  Visit Diagnosis: Muscle weakness (generalized)     Problem List Patient Active Problem List   Diagnosis Date Noted  . 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 Other fatigue 05/25/2017  . Shortness of breath on exertion 05/25/2017  . Essential hypertension 05/25/2017  . Vitamin D deficiency 05/25/2017    Rosana Hoes, PT, DPT, LAT,  ATC 09/20/19  5:39 PM Phone: [phone] Fax: [phone]   [facility] [address] [place], [place], [address] Phone: [phone]   Fax:  [phone]  Name: Lindsey [name] MRN: [id number] Date of Birth: Oct 13, 1955

## 2019-09-20 NOTE — Patient Instructions (Signed)
  Knee flexion / curl machine   Knee extension   Leg press  Hip abduction machine   Row machine   Lat pull down machine   Chest press   Bicep curl  Tricep push down

## 2019-09-21 ENCOUNTER — Other Ambulatory Visit (INDEPENDENT_AMBULATORY_CARE_PROVIDER_SITE_OTHER): Payer: Self-pay | Admitting: Family Medicine

## 2019-09-21 DIAGNOSIS — F3289 Other specified depressive episodes: Secondary | ICD-10-CM

## 2019-09-26 ENCOUNTER — Ambulatory Visit (INDEPENDENT_AMBULATORY_CARE_PROVIDER_SITE_OTHER): Payer: BC Managed Care – PPO | Admitting: Family Medicine

## 2019-09-26 ENCOUNTER — Other Ambulatory Visit: Payer: Self-pay

## 2019-09-26 ENCOUNTER — Encounter (INDEPENDENT_AMBULATORY_CARE_PROVIDER_SITE_OTHER): Payer: Self-pay | Admitting: Family Medicine

## 2019-09-26 VITALS — BP 96/60 | HR 79 | Temp 98.1°F | Ht 66.0 in | Wt 183.0 lb

## 2019-09-26 DIAGNOSIS — Z9189 Other specified personal risk factors, not elsewhere classified: Secondary | ICD-10-CM | POA: Diagnosis not present

## 2019-09-26 DIAGNOSIS — F3289 Other specified depressive episodes: Secondary | ICD-10-CM | POA: Diagnosis not present

## 2019-09-26 DIAGNOSIS — E66811 Obesity, class 1: Secondary | ICD-10-CM

## 2019-09-26 DIAGNOSIS — Z6830 Body mass index (BMI) 30.0-30.9, adult: Secondary | ICD-10-CM

## 2019-09-26 DIAGNOSIS — E669 Obesity, unspecified: Secondary | ICD-10-CM

## 2019-09-26 MED ORDER — BUPROPION HCL ER (SR) 150 MG PO TB12: 150.0000 mg | ORAL_TABLET | Freq: Every day | ORAL | 0 refills | Status: DC

## 2019-09-27 ENCOUNTER — Ambulatory Visit
Admission: RE | Admit: 2019-09-27 | Discharge: 2019-09-27 | Disposition: A | Discharge status: Home / Self Care | Payer: BC Managed Care – PPO | Source: Ambulatory Visit | Attending: Family Medicine | Admitting: Family Medicine

## 2019-09-27 DIAGNOSIS — R102 Pelvic and perineal pain: Secondary | ICD-10-CM

## 2019-10-01 ENCOUNTER — Other Ambulatory Visit: Payer: Self-pay | Admitting: Family Medicine

## 2019-10-01 DIAGNOSIS — R1900 Intra-abdominal and pelvic swelling, mass and lump, unspecified site: Secondary | ICD-10-CM

## 2019-10-01 NOTE — Progress Notes (Signed)
Chief Complaint:   OBESITY Lindsey [name] is here to discuss her progress with her obesity treatment plan along with follow-up of her obesity related diagnoses. Lindsey [name] is on keeping a food journal and adhering to recommended goals of 1500-1800 calories and 100+ grams of protein daily and states she is following her eating plan approximately 85-90% of the time. [name] states she is walking for 40+ minutes 6 times per week.  Today's visit was #: 25 Starting weight: 325 lbs Starting date: 06/04/2017 Today's weight: 183 lbs Today's date: 09/26/2019 Total lbs lost to date: 142 Total lbs lost since last in-office visit: 8  Interim History: Lindsey [name] continues to do well with weight loss, but she notes having increased PM hunger mostly when she goes 8 hours without eating. She did 1 physical therapy appointment to help with generalized wellness but she was told to go to the gym instead.  Subjective:   1. Other depression with emotional eating Lance is stable on Wellbutrin, and she is uncertain if it is helping much but would like to give it a bit more time.  2. At risk for complication associated with hypotension The patient is at a higher than average risk of hypotension due to weight loss.  Assessment/Plan:   1. Other depression with emotional eating Behavior modification techniques were discussed today to help Lindsey [name] deal with her emotional/non-hunger eating behaviors. We will refill Wellbutrin SR 150 mg daily for 1 month. We will follow up in 1 month. Orders and follow up as documented in patient record.   - buPROPion (WELLBUTRIN SR) 150 MG 12 hr tablet; Take 1 tablet (150 mg total) by mouth daily.  Dispense: 30 tablet; Refill: 0  2. At risk for complication associated with hypotension Asuncion was given approximately 15 minutes of education and counseling today to help avoid hypotension. We discussed risks of hypotension with weight loss and signs of hypotension such as feeling lightheaded or  unsteady.  Repetitive spaced learning was employed today to elicit superior memory formation and behavioral change.  3. Class 1 obesity with serious comorbidity and body mass index (BMI) of 30.0 to 30.9 in adult, unspecified obesity type Lindsey [name] is currently in the action stage of change. As such, her goal is to continue with weight loss efforts. She has agreed to keeping a food journal and adhering to recommended goals of 1500-1800 calories and 100+ grams of protein daily.   Exercise goals: As is.  Behavioral modification strategies: increasing lean protein intake and meal planning and cooking strategies.  Lindsey [name] has agreed to follow-up with our clinic in 4 weeks. She was informed of the importance of frequent follow-up visits to maximize her success with intensive lifestyle modifications for her multiple health conditions.   Objective:   Blood pressure 96/60, pulse 79, temperature 98.1 F (36.7 C), temperature source Oral, height 5\' 6"  (1.676 m), weight 183 lb (83 kg), SpO2 98 %. Body mass index is 29.54 kg/m.  General: Cooperative, alert, well developed, in no acute distress. HEENT: Conjunctivae and lids unremarkable. Cardiovascular: Regular rhythm.  Lungs: Normal work of breathing. Neurologic: No focal deficits.   Lab Results  Component Value Date   CREATININE 0.57 03/26/2019   BUN 17 03/26/2019   NA 145 (H) 03/26/2019   K 4.1 03/26/2019   CL 107 (H) 03/26/2019   CO2 25 03/26/2019   Lab Results  Component Value Date   ALT 14 03/26/2019   AST 17 03/26/2019   ALKPHOS 68 03/26/2019   BILITOT 1.1  03/26/2019   Lab Results  Component Value Date   HGBA1C 5.2 03/26/2019   HGBA1C 5.4 03/09/2018   HGBA1C 5.5 10/12/2017   HGBA1C 5.7 (H) 05/25/2017   Lab Results  Component Value Date   INSULIN 4.4 03/26/2019   INSULIN 10.0 03/09/2018   INSULIN 19.1 10/12/2017   INSULIN 44.2 (H) 05/25/2017   Lab Results  Component Value Date   TSH 2.070 05/25/2017   Lab Results   Component Value Date   CHOL 159 03/26/2019   HDL 66 03/26/2019   LDLCALC 81 03/26/2019   TRIG 59 03/26/2019   Lab Results  Component Value Date   WBC 6.0 05/25/2017   HGB 13.6 05/25/2017   HCT 40.7 05/25/2017   MCV 91 05/25/2017   No results found for: IRON, TIBC, FERRITIN  Attestation Statements:   Reviewed by clinician on day of visit: allergies, medications, problem list, medical history, surgical history, family history, social history, and previous encounter notes.   I, Burt Knack, am acting as transcriptionist for Quillian Quince, MD.  I have reviewed the above documentation for accuracy and completeness, and I agree with the above. -  Quillian Quince, MD

## 2019-10-02 ENCOUNTER — Ambulatory Visit
Admission: RE | Admit: 2019-10-02 | Discharge: 2019-10-02 | Disposition: A | Discharge status: Home / Self Care | Payer: BC Managed Care – PPO | Source: Ambulatory Visit | Attending: Family Medicine | Admitting: Family Medicine

## 2019-10-02 DIAGNOSIS — R1900 Intra-abdominal and pelvic swelling, mass and lump, unspecified site: Secondary | ICD-10-CM

## 2019-10-02 MED ORDER — GADOBENATE DIMEGLUMINE 529 MG/ML IV SOLN
17.0000 mL | Freq: Once | INTRAVENOUS | Status: AC | PRN
  Administered 2019-10-02: 17 mL via INTRAVENOUS

## 2019-10-23 ENCOUNTER — Encounter (INDEPENDENT_AMBULATORY_CARE_PROVIDER_SITE_OTHER): Payer: Self-pay | Admitting: Family Medicine

## 2019-10-24 ENCOUNTER — Other Ambulatory Visit (INDEPENDENT_AMBULATORY_CARE_PROVIDER_SITE_OTHER): Payer: Self-pay | Admitting: Family Medicine

## 2019-10-24 ENCOUNTER — Ambulatory Visit (INDEPENDENT_AMBULATORY_CARE_PROVIDER_SITE_OTHER): Payer: BC Managed Care – PPO | Admitting: Family Medicine

## 2019-10-24 DIAGNOSIS — F3289 Other specified depressive episodes: Secondary | ICD-10-CM

## 2019-11-20 ENCOUNTER — Other Ambulatory Visit (INDEPENDENT_AMBULATORY_CARE_PROVIDER_SITE_OTHER): Payer: Self-pay | Admitting: Family Medicine

## 2019-11-20 DIAGNOSIS — F3289 Other specified depressive episodes: Secondary | ICD-10-CM

## 2019-11-22 ENCOUNTER — Encounter (INDEPENDENT_AMBULATORY_CARE_PROVIDER_SITE_OTHER): Payer: Self-pay | Admitting: Family Medicine

## 2019-11-22 ENCOUNTER — Other Ambulatory Visit: Payer: Self-pay

## 2019-11-22 ENCOUNTER — Ambulatory Visit (INDEPENDENT_AMBULATORY_CARE_PROVIDER_SITE_OTHER): Payer: BC Managed Care – PPO | Admitting: Family Medicine

## 2019-11-22 ENCOUNTER — Other Ambulatory Visit (INDEPENDENT_AMBULATORY_CARE_PROVIDER_SITE_OTHER): Payer: Self-pay | Admitting: Family Medicine

## 2019-11-22 VITALS — BP 132/73 | HR 67 | Temp 98.2°F | Ht 66.0 in | Wt 182.0 lb

## 2019-11-22 DIAGNOSIS — F3289 Other specified depressive episodes: Secondary | ICD-10-CM

## 2019-11-22 DIAGNOSIS — E669 Obesity, unspecified: Secondary | ICD-10-CM | POA: Diagnosis not present

## 2019-11-22 DIAGNOSIS — Z9189 Other specified personal risk factors, not elsewhere classified: Secondary | ICD-10-CM | POA: Diagnosis not present

## 2019-11-22 DIAGNOSIS — Z6830 Body mass index (BMI) 30.0-30.9, adult: Secondary | ICD-10-CM | POA: Diagnosis not present

## 2019-11-22 MED ORDER — BUPROPION HCL ER (SR) 200 MG PO TB12: 200.0000 mg | ORAL_TABLET | Freq: Every day | ORAL | 1 refills | Status: DC

## 2019-11-22 NOTE — Progress Notes (Signed)
Chief Complaint:   OBESITY Margorie is here to discuss her progress with her obesity treatment plan along with follow-up of her obesity related diagnoses. Lindsey [name] is on keeping a food journal and adhering to recommended goals of 1500-1800 calories and 100+ grams of protein daily and states she is following her eating plan approximately 70% of the time. Lindsey [name] states she is walking and at the gym for 40 minutes 3 times per week.  Today's visit was #: 26 Starting weight: 325 lbs Starting date: 06/04/2017 Today's weight: 182 lbs Today's date: 11/22/2019 Total lbs lost to date: 143 Total lbs lost since last in-office visit: 1  Interim History: Lindsey [name] notes increased stress due to decreased work. She is working on meeting her calorie goals but about 1 day per week she overeats up to 3,000 kcal per day. She is mostly meeting her protein goals.  Subjective:   1. Other depression with emotional eating Lindsey [name] has days when she overindulges mostly with higher calorie simple carbohydrates such as bread, donuts, and peanut butter.  2. At risk for impaired metabolic function Lindsey [name] is at increased risk for impaired metabolic function due to current nutrition and muscle mass.  Assessment/Plan:   1. Other depression with emotional eating Behavior modification techniques were discussed today to help Lindsey [name] deal with her emotional/non-hunger eating behaviors. [name] agreed to increase Wellbutrin SR to 200 mg q AM with no refills. Orders and follow up as documented in patient record.   - buPROPion (WELLBUTRIN SR) 200 MG 12 hr tablet; Take 1 tablet (200 mg total) by mouth daily.  Dispense: 30 tablet; Refill: 1  2. At risk for impaired metabolic function Lindsey [name] was given approximately 15 minutes of impaired  metabolic function prevention counseling today. We discussed intensive lifestyle modifications today with an emphasis on specific nutrition and exercise instructions and strategies.   Repetitive spaced learning was  employed today to elicit superior memory formation and behavioral change.  3. Class 1 obesity with serious comorbidity and body mass index (BMI) of 30.0 to 30.9 in adult, unspecified obesity type Lindsey [name] is currently in the action stage of change. As such, her goal is to continue with weight loss efforts. She has agreed to keeping a food journal and adhering to recommended goals of 1500-1800 calories and 100+ grams of protein daily.   Lindsey [name] was urged to identify what is the trigger on the days when she eats higher calorie foods.  Exercise goals: As is.  Behavioral modification strategies: emotional eating strategies.  Lindsey [name] has agreed to follow-up with our clinic in 8 weeks. She was informed of the importance of frequent follow-up visits to maximize her success with intensive lifestyle modifications for her multiple health conditions.   Objective:   Blood pressure 132/73, pulse 67, temperature 98.2 F (36.8 C), height 5\' 6"  (1.676 m), weight 182 lb (82.6 kg), SpO2 98 %. Body mass index is 29.38 kg/m.  General: Cooperative, alert, well developed, in no acute distress. HEENT: Conjunctivae and lids unremarkable. Cardiovascular: Regular rhythm.  Lungs: Normal work of breathing. Neurologic: No focal deficits.   Lab Results  Component Value Date   CREATININE 0.57 03/26/2019   BUN 17 03/26/2019   NA 145 (H) 03/26/2019   K 4.1 03/26/2019   CL 107 (H) 03/26/2019   CO2 25 03/26/2019   Lab Results  Component Value Date   ALT 14 03/26/2019   AST 17 03/26/2019   ALKPHOS 68 03/26/2019   BILITOT 1.1 03/26/2019   Lab Results  Component Value Date   HGBA1C 5.2 03/26/2019   HGBA1C 5.4 03/09/2018   HGBA1C 5.5 10/12/2017   HGBA1C 5.7 (H) 05/25/2017   Lab Results  Component Value Date   INSULIN 4.4 03/26/2019   INSULIN 10.0 03/09/2018   INSULIN 19.1 10/12/2017   INSULIN 44.2 (H) 05/25/2017   Lab Results  Component Value Date   TSH 2.070 05/25/2017   Lab Results  Component Value  Date   CHOL 159 03/26/2019   HDL 66 03/26/2019   LDLCALC 81 03/26/2019   TRIG 59 03/26/2019   Lab Results  Component Value Date   WBC 6.0 05/25/2017   HGB 13.6 05/25/2017   HCT 40.7 05/25/2017   MCV 91 05/25/2017   No results found for: IRON, TIBC, FERRITIN  Attestation Statements:   Reviewed by clinician on day of visit: allergies, medications, problem list, medical history, surgical history, family history, social history, and previous encounter notes.   I, Burt Knack, am acting as transcriptionist for Quillian Quince, MD.  I have reviewed the above documentation for accuracy and completeness, and I agree with the above. -  Quillian Quince, MD

## 2019-12-05 ENCOUNTER — Telehealth: Payer: Self-pay | Admitting: Medical Oncology

## 2019-12-05 NOTE — Telephone Encounter (Signed)
COMET Call to patient to check and see if she has a scheduled mammogram. Patient is due for her 53- month study mammogram and visit. Per patient, she just had her mammogram at [facility] yesterday. Patient was scheduled to see Dr. Pamelia Hoit 10/15th, informed patient that we will reschedule this appt to a sooner date. Per patient she is available next week and open on 9/22. Informed patient will schedule her for MD on 9/22 @ 2:30. Patient confirmed. Message sent to scheduling.  Patient also informed me that she has a unilateral mammogram scheduled for March 14th 2022. Patient thanked for her time and encouraged to call with questions in the meantime.  Copy of mammogram requested to be faxed to research for Dr. Pamelia Hoit to review.  Rexene Edison, RN, BSN, [facility] Clinical Research 12/05/2019 3:50 PM

## 2019-12-11 NOTE — Progress Notes (Signed)
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 Biopsy showed low-grade DCIS, ER+ 100%, PR+ 100%.      CHIEF COMPLIANT: Follow-up of left breast DCIS  INTERVAL HISTORY: Lindsey [name] is a 64 y.o. with above-mentioned history of left breast DCIS. She is a participant in the COMET clinical trial and was randomized to the active surveillance arm. She presents to the clinic today to review her recent mammogram.    ALLERGIES:  is allergic to lactose intolerance (gi), sulfa antibiotics, and amoxicillin.  MEDICATIONS:  Current Outpatient Medications  Medication Sig Dispense Refill  . buPROPion (WELLBUTRIN SR) 200 MG 12 hr tablet Take 1 tablet (200 mg total) by mouth daily. 30 tablet 1  . Cholecalciferol (VITAMIN D3) 125 MCG (5000 UT) CAPS Take 1 capsule by mouth daily.    . fluticasone (FLONASE) 50 MCG/ACT nasal spray Place 1 spray into both nostrils daily.  2  . Multiple Vitamins-Minerals (WOMENS MULTIVITAMIN PLUS PO) Take 1 tablet by mouth daily.    . Turmeric 500 MG CAPS Take 1 capsule by mouth daily.     No current facility-administered medications for this visit.    PHYSICAL EXAMINATION: ECOG PERFORMANCE STATUS: 1 - Symptomatic but completely ambulatory  Vitals:   12/12/19 1450  BP: 134/67  Pulse: 66  Resp: 16  Temp: (!) 97 F (36.1 C)  SpO2: 100%   Filed Weights   12/12/19 1450  Weight: 182 lb (82.6 kg)    BREAST: No palpable masses or nodules in either right or left breasts. No palpable axillary supraclavicular or infraclavicular adenopathy no breast tenderness or nipple discharge. (exam performed in the presence of a chaperone)  LABORATORY DATA:  I  have reviewed the data as listed CMP Latest Ref Rng & Units 03/26/2019 03/09/2018 10/12/2017  Glucose 65 - 99 mg/dL 81 92 92  BUN 8 - 27 mg/dL 17 18 14   Creatinine 0.57 - 1.00 mg/dL 1.19 1.47  Sodium 134 - 144 mmol/L 145(H) 141 143  Potassium 3.5 - 5.2 mmol/L 4.1 4.7 4.3  Chloride 96 - 106 mmol/L 107(H) 102 100  CO2 20 - 29 mmol/L 25 26 25   Calcium 8.7 - 10.3 mg/dL 9.7 9.7 9.6  Total Protein 6.0 - 8.5 g/dL 6.4 6.6 6.7  Total Bilirubin 0.0 - 1.2 mg/dL 1.1 0.8 1.1  Alkaline Phos 39 - 117 IU/L 68 79 74  AST 0 - 40 IU/L 17 17 21   ALT 0 - 32 IU/L 14 20 29     Lab Results  Component Value Date   WBC 6.0 05/25/2017   HGB 13.6 05/25/2017   HCT 40.7 05/25/2017   MCV 91 05/25/2017   NEUTROABS 3.3 05/25/2017    ASSESSMENT & PLAN:  Ductal carcinoma in situ (DCIS) of left breast 11/21/2018:Screening detected left breast mammogram revealing calcifications: Biopsy revealed low-grade DCIS ER 100%, PR 100%Tis NX stage 0  Treatment plan: Comet clinical trial participation (active monitoring arm)   Mammogram at [facility] 12/04/2019: Stable appearance of 0.3 cm group of amorphous calcifications in the left breast.  Current treatment: Patient decided not to take tamoxifen therapy.   Return to clinic in 6 months after the mammograms  for follow-up    No orders of the defined types were placed in this encounter.  The patient has a good understanding of the overall plan. she agrees with it. she will call with any problems that may develop before the next visit here.  Total time spent: 20 mins including face to face time and time spent for planning, charting and coordination of care  Serena Croissant, MD 12/12/2019  I, Kirt Boys Dorshimer, am acting as scribe for Dr. Serena Croissant.  I have reviewed the above documentation for accuracy and completeness, and I agree with the above.

## 2019-12-12 ENCOUNTER — Other Ambulatory Visit: Payer: Self-pay

## 2019-12-12 ENCOUNTER — Encounter: Payer: Self-pay | Admitting: Medical Oncology

## 2019-12-12 ENCOUNTER — Inpatient Hospital Stay: Payer: BC Managed Care – PPO | Attending: Hematology and Oncology | Admitting: Hematology and Oncology

## 2019-12-12 VITALS — BP 134/67 | HR 66 | Temp 97.0°F | Resp 16 | Ht 66.0 in | Wt 182.0 lb

## 2019-12-12 DIAGNOSIS — D0512 Intraductal carcinoma in situ of left breast: Secondary | ICD-10-CM

## 2019-12-12 DIAGNOSIS — Z006 Encounter for examination for normal comparison and control in clinical research program: Secondary | ICD-10-CM | POA: Insufficient documentation

## 2019-12-12 DIAGNOSIS — Z780 Asymptomatic menopausal state: Secondary | ICD-10-CM

## 2019-12-12 DIAGNOSIS — Z170 Estrogen receptor positive status [ER+]: Secondary | ICD-10-CM | POA: Diagnosis not present

## 2019-12-12 NOTE — Progress Notes (Signed)
COMET: Active Surveillance:  56-month follow-up I met with patient, here alone, in the exam room for her 12 months on study follow-up. Patient had her bilateral mammogram 12/04/19 and is here for MD review and study assessments. Patient's mammogram was completed earlier than expected, patient self-scheduled the appointment.  Patient continues with decision to not be on any endocrine therapy (ET) since diagnosis.   H&P: MD reviewed results of unilateral mammogram with patient. MD also answered any questions/concerns patient had.  Questionnaire: patient denies receiving the study questionnaires as of today. Patient elected to receive questionnaires in paper form and I informed her that she should be receiving these shortly, and to expect them, to complete as soon as she can and place them in the mail using the postage paid envelope provided. Patient gave her verbal understanding to this.  Study Solicited Adverse Events: Patient denies having any new or worsening issues and no hospital admissions.  Plan: Patient informed that next study visit will be in approximately six months time, after having a unilateral mammogram, to be seen in clinic. Patient thanked for her continued support of study and was encouraged to call with any questions or concerns she may have in the meantime.   Maxwell Marion, RN, BSN, [facility]  Clinical Research 12/12/2019 3:00 PM  KA[facility] P[place]ST[facility] [phone]   Adverse Event Log  Study/Protocol: COMET Cycle: Baseline - 6 months  Event Grade Onset Date Resolved Date Drug Name Attribution Treatment Comments  Hot flashes 1 Baseline resolved N/A History of None   Hypertension 2 Baseline 07/04/19 N/A History of None   Pain- bilateral knees 1 Baseline ongoing N/A History of Conmed   alopecia 1 Baseline ongoing N/A History of  None    multiple stiff joints- decreased range of motion 1 Baseline ongoing N/A History of None            Hypertension 1 12/12/2019 ongoing N/A H/O baseline  Gr 2 None   Hot flashes 1 ~ 07/2019 Ongoing  N/A H/O None Per pt. 1-2 small hot flashes in a day

## 2019-12-12 NOTE — Assessment & Plan Note (Addendum)
11/21/2018:Screening detected left breast mammogram revealing calcifications: Biopsy revealed low-grade DCIS ER 100%, PR 100%Tis NX stage 0  Treatment plan: Comet clinical trial participation (active monitoring arm)   Mammogram at [facility] 12/04/2019: Stable appearance of 0.3 cm group of amorphous calcifications in the left breast.  Current treatment: Patient decided not to take tamoxifen therapy.   Return to clinic in 6 months after the mammograms for follow-up

## 2019-12-13 ENCOUNTER — Encounter: Payer: Self-pay | Admitting: Hematology and Oncology

## 2019-12-14 ENCOUNTER — Telehealth: Payer: Self-pay | Admitting: Hematology and Oncology

## 2019-12-14 NOTE — Telephone Encounter (Signed)
Scheduled per 9/22 los. Called and spoke with pt, confirmed added appt

## 2020-01-04 ENCOUNTER — Ambulatory Visit: Payer: BC Managed Care – PPO | Admitting: Hematology and Oncology

## 2020-01-11 ENCOUNTER — Ambulatory Visit: Payer: BC Managed Care – PPO | Admitting: Hematology and Oncology

## 2020-01-17 ENCOUNTER — Other Ambulatory Visit: Payer: Self-pay

## 2020-01-17 ENCOUNTER — Encounter (INDEPENDENT_AMBULATORY_CARE_PROVIDER_SITE_OTHER): Payer: Self-pay | Admitting: Family Medicine

## 2020-01-17 ENCOUNTER — Ambulatory Visit (INDEPENDENT_AMBULATORY_CARE_PROVIDER_SITE_OTHER): Payer: BC Managed Care – PPO | Admitting: Family Medicine

## 2020-01-17 VITALS — BP 128/83 | HR 73 | Temp 98.0°F | Ht 66.0 in | Wt 176.0 lb

## 2020-01-17 DIAGNOSIS — F3289 Other specified depressive episodes: Secondary | ICD-10-CM | POA: Diagnosis not present

## 2020-01-17 DIAGNOSIS — Z6830 Body mass index (BMI) 30.0-30.9, adult: Secondary | ICD-10-CM | POA: Diagnosis not present

## 2020-01-17 DIAGNOSIS — E669 Obesity, unspecified: Secondary | ICD-10-CM | POA: Diagnosis not present

## 2020-01-17 MED ORDER — BUPROPION HCL ER (SR) 200 MG PO TB12: 200.0000 mg | ORAL_TABLET | Freq: Every day | ORAL | 0 refills | Status: DC

## 2020-01-21 NOTE — Progress Notes (Signed)
Chief Complaint:   OBESITY Lindsey [name] is here to discuss her progress with her obesity treatment plan along with follow-up of her obesity related diagnoses. Lindsey [name] is on keeping a food journal and adhering to recommended goals of 1500-1800 calories and 100+ grams of protein daily and states she is following her eating plan approximately 90% of the time. Lindsey [name] states she is walking (gym machine" for 40 minutes 3-4 times per week.  Today's visit was #: 27 Starting weight: 325 lbs Starting date: 06/04/2017 Today's weight: 173 lbs Today's date: 01/17/2020 Total lbs lost to date: 152 lbs Total lbs lost since last in-office visit: 9  Interim History: Lindsey [name] continues to do very well with weight loss on her Journaling plan. She is trying to keep her calories around 1500-1600, and she is doing well meeting her protein goals.  Subjective:   1. Other depression with emotional eating Lindsey [name] is stable on her medications. She noticed some increased xerostomia and constipation initially but this has improved.  Assessment/Plan:   1. Other depression with emotional eating Behavior modification techniques were discussed today to help Lindsey [name] deal with her emotional/non-hunger eating behaviors. We will refill Wellbutrin SR for 90 days, with no refill and will continue to follow closely. Orders and follow up as documented in patient record.   - buPROPion (WELLBUTRIN SR) 200 MG 12 hr tablet; Take 1 tablet (200 mg total) by mouth daily.  Dispense: 90 tablet; Refill: 0  2. Class 1 obesity with serious comorbidity and body mass index (BMI) of 30.0 to 30.9 in adult, unspecified obesity type Lindsey [name] is currently in the action stage of change. As such, her goal is to continue with weight loss efforts. She has agreed to keeping a food journal and adhering to recommended goals of 1500-1800 calories and 100+ grams of protein daily.   Exercise goals: As is.  Behavioral modification strategies: better snacking choices and  holiday eating strategies .  Lindsey [name] has agreed to follow-up with our clinic in 8 to 10 weeks. She was informed of the importance of frequent follow-up visits to maximize her success with intensive lifestyle modifications for her multiple health conditions.   Objective:   Blood pressure 128/83, pulse 73, temperature 98 F (36.7 C), height 5\' 6"  (1.676 m), weight 176 lb (79.8 kg), SpO2 99 %. Body mass index is 28.41 kg/m.  General: Cooperative, alert, well developed, in no acute distress. HEENT: Conjunctivae and lids unremarkable. Cardiovascular: Regular rhythm.  Lungs: Normal work of breathing. Neurologic: No focal deficits.   Lab Results  Component Value Date   CREATININE 0.57 03/26/2019   BUN 17 03/26/2019   NA 145 (H) 03/26/2019   K 4.1 03/26/2019   CL 107 (H) 03/26/2019   CO2 25 03/26/2019   Lab Results  Component Value Date   ALT 14 03/26/2019   AST 17 03/26/2019   ALKPHOS 68 03/26/2019   BILITOT 1.1 03/26/2019   Lab Results  Component Value Date   HGBA1C 5.2 03/26/2019   HGBA1C 5.4 03/09/2018   HGBA1C 5.5 10/12/2017   HGBA1C 5.7 (H) 05/25/2017   Lab Results  Component Value Date   INSULIN 4.4 03/26/2019   INSULIN 10.0 03/09/2018   INSULIN 19.1 10/12/2017   INSULIN 44.2 (H) 05/25/2017   Lab Results  Component Value Date   TSH 2.070 05/25/2017   Lab Results  Component Value Date   CHOL 159 03/26/2019   HDL 66 03/26/2019   LDLCALC 81 03/26/2019   TRIG 59 03/26/2019  Lab Results  Component Value Date   WBC 6.0 05/25/2017   HGB 13.6 05/25/2017   HCT 40.7 05/25/2017   MCV 91 05/25/2017   No results found for: IRON, TIBC, FERRITIN  Attestation Statements:   Reviewed by clinician on day of visit: allergies, medications, problem list, medical history, surgical history, family history, social history, and previous encounter notes.  Time spent on visit including pre-visit chart review and post-visit care and charting was 32 minutes.    I, Burt Knack, am acting as transcriptionist for Quillian Quince, MD.  I have reviewed the above documentation for accuracy and completeness, and I agree with the above. -  Quillian Quince, MD

## 2020-01-31 ENCOUNTER — Other Ambulatory Visit (INDEPENDENT_AMBULATORY_CARE_PROVIDER_SITE_OTHER): Payer: Self-pay | Admitting: Family Medicine

## 2020-01-31 DIAGNOSIS — F3289 Other specified depressive episodes: Secondary | ICD-10-CM

## 2020-03-24 ENCOUNTER — Encounter (INDEPENDENT_AMBULATORY_CARE_PROVIDER_SITE_OTHER): Payer: Self-pay | Admitting: Family Medicine

## 2020-03-24 ENCOUNTER — Ambulatory Visit (INDEPENDENT_AMBULATORY_CARE_PROVIDER_SITE_OTHER): Payer: BC Managed Care – PPO | Admitting: Family Medicine

## 2020-03-24 ENCOUNTER — Other Ambulatory Visit: Payer: Self-pay

## 2020-03-24 VITALS — BP 100/66 | HR 74 | Temp 97.7°F | Ht 66.0 in | Wt 171.0 lb

## 2020-03-24 DIAGNOSIS — E038 Other specified hypothyroidism: Secondary | ICD-10-CM

## 2020-03-24 DIAGNOSIS — E669 Obesity, unspecified: Secondary | ICD-10-CM | POA: Diagnosis not present

## 2020-03-24 DIAGNOSIS — Z6830 Body mass index (BMI) 30.0-30.9, adult: Secondary | ICD-10-CM | POA: Diagnosis not present

## 2020-03-24 DIAGNOSIS — Z9189 Other specified personal risk factors, not elsewhere classified: Secondary | ICD-10-CM | POA: Diagnosis not present

## 2020-03-24 DIAGNOSIS — I959 Hypotension, unspecified: Secondary | ICD-10-CM

## 2020-03-24 DIAGNOSIS — F3289 Other specified depressive episodes: Secondary | ICD-10-CM

## 2020-03-24 MED ORDER — BUPROPION HCL ER (SR) 200 MG PO TB12: 200.0000 mg | ORAL_TABLET | Freq: Every day | ORAL | 0 refills | Status: DC

## 2020-03-26 NOTE — Progress Notes (Signed)
Chief Complaint:   OBESITY Lindsey [name] is here to discuss her progress with her obesity treatment plan along with follow-up of her obesity related diagnoses. Lindsey [name] is on keeping a food journal and adhering to recommended goals of 1500-1800 calories and 100+ grams of protein daily and states she is following her eating plan approximately 50% of the time. Lindsey [name] states she is walking and at the gym for 40 minutes 6 times per week.  Today's visit was #: 28 Starting weight: 325 lbs Starting date: 06/04/2017 Today's weight: 171 lbs Today's date: 03/24/2020 Total lbs lost to date: 154 lbs Total lbs lost since last in-office visit: 5  Interim History: Lindsey [name] has done well avoiding weight gain over the holidays. She has lost a few more lbs in the last 3 months. She is still journaling regularly and working on increasing lean protein. She is exercising regularly but she is limited by some joint pain.  Subjective:   1. Other depression Lindsey [name]'s mood is stable on her medications. She is doing well dealing with emotional eating. She denies increase in blood pressure.  2. Hypotension Lindsey [name]'s has had 2-3 episodes of feeling dizzy in the last 3 months. She is not on blood pressure medications, and she has had this happen before. She feels she is hydrating well overall. Her blood pressure has fluctuated up and down in the past.  3. At risk for dehydration Lindsey [name] is at risk for dehydration due to weight loss.  Assessment/Plan:   1. Other depression Behavior modification techniques were discussed today to help Lindsey [name] deal with her emotional/non-hunger eating behaviors. We will refill Wellbutrin SR for 90 days with no refills. Orders and follow up as documented in patient record.   - buPROPion (WELLBUTRIN SR) 200 MG 12 hr tablet; Take 1 tablet (200 mg total) by mouth daily.  Dispense: 90 tablet; Refill: 0  2. Hypotension Lindsey [name] is working on healthy weight loss and exercise to improve blood pressure control. We will  watch for signs of hypotension as she continues her lifestyle modifications. Lindsey [name] was encouraged to continue to increase her water as weight loss frequently increases dehydration.   3. At risk for dehydration Lindsey [name] was given approximately 15 minutes dehydration prevention counseling today. Lindsey [name] is at risk for dehydration due to weight loss and current medication(s). She was encouraged to hydrate and monitor fluid status to avoid dehydration as well as weight loss plateaus.   4. Class 1 obesity with serious comorbidity and body mass index (BMI) of 30.0 to 30.9 in adult, unspecified obesity type Lindsey [name] is currently in the action stage of change. As such, her goal is to continue with weight loss efforts. She has agreed to keeping a food journal and adhering to recommended goals of 1600-1800 calories and 100+ grams of protein daily.   Exercise goals: As is.  Behavioral modification strategies: increasing water intake and keeping a strict food journal.  Lindsey [name] has agreed to follow-up with our clinic in 3 months. She was informed of the importance of frequent follow-up visits to maximize her success with intensive lifestyle modifications for her multiple health conditions.   Objective:   Blood pressure 100/66, pulse 74, temperature 97.7 F (36.5 C), height 5\' 6"  (1.676 m), weight 171 lb (77.6 kg), SpO2 99 %. Body mass index is 27.6 kg/m.  General: Cooperative, alert, well developed, in no acute distress. HEENT: Conjunctivae and lids unremarkable. Cardiovascular: Regular rhythm.  Lungs: Normal work of breathing. Neurologic: No focal deficits.   Lab Results  Component Value Date   CREATININE 0.57 03/26/2019   BUN 17 03/26/2019   NA 145 (H) 03/26/2019   K 4.1 03/26/2019   CL 107 (H) 03/26/2019   CO2 25 03/26/2019   Lab Results  Component Value Date   ALT 14 03/26/2019   AST 17 03/26/2019   ALKPHOS 68 03/26/2019   BILITOT 1.1 03/26/2019   Lab Results  Component Value Date   HGBA1C 5.2  03/26/2019   HGBA1C 5.4 03/09/2018   HGBA1C 5.5 10/12/2017   HGBA1C 5.7 (H) 05/25/2017   Lab Results  Component Value Date   INSULIN 4.4 03/26/2019   INSULIN 10.0 03/09/2018   INSULIN 19.1 10/12/2017   INSULIN 44.2 (H) 05/25/2017   Lab Results  Component Value Date   TSH 2.070 05/25/2017   Lab Results  Component Value Date   CHOL 159 03/26/2019   HDL 66 03/26/2019   LDLCALC 81 03/26/2019   TRIG 59 03/26/2019   Lab Results  Component Value Date   WBC 6.0 05/25/2017   HGB 13.6 05/25/2017   HCT 40.7 05/25/2017   MCV 91 05/25/2017   No results found for: IRON, TIBC, FERRITIN  Attestation Statements:   Reviewed by clinician on day of visit: allergies, medications, problem list, medical history, surgical history, family history, social history, and previous encounter notes.   I, Burt Knack, am acting as transcriptionist for Quillian Quince, MD.  I have reviewed the above documentation for accuracy and completeness, and I agree with the above. -  Quillian Quince, MD

## 2020-05-13 ENCOUNTER — Telehealth: Payer: Self-pay | Admitting: Medical Oncology

## 2020-05-13 NOTE — Telephone Encounter (Signed)
COMET: outgoing call Spoke with patient to confirm that she has a scheduled mammogram prior to her appt with Dr. Pamelia Hoit on 3/23. Patient confirms mammogram scheduled at [facility] on 3/14th. Patient also informed that she will be receiving an email for the study questionnaire, for her to complete, just prior to her MD appt. Patient denies any questions at this time. Patient thanked for her time and encouraged to call with questions.  Rexene Edison, RN, BSN, [facility] Clinical Research 05/13/2020 11:48 AM

## 2020-06-02 ENCOUNTER — Encounter: Payer: Self-pay | Admitting: Hematology and Oncology

## 2020-06-02 LAB — HM DEXA SCAN: HM Dexa Scan: NORMAL

## 2020-06-02 LAB — HM MAMMOGRAPHY

## 2020-06-10 NOTE — Progress Notes (Signed)
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 Biopsy showed low-grade DCIS, ER+ 100%, PR+ 100%.      CHIEF COMPLIANT: Follow-up of left breast DCIS  INTERVAL HISTORY: Lindsey [name] is a 65 y.o. with above-mentioned history of eft breast DCIS. She is a participant in the COMET clinical trial and was randomized to the active surveillance arm. She presents to the clinic todayfor follow-up.  Lately she has been battling stye of the left eyelid.  Other than that she is without any problems or concerns.  ALLERGIES:  is allergic to lactose intolerance (gi), sulfa antibiotics, and amoxicillin.  MEDICATIONS:  Current Outpatient Medications  Medication Sig Dispense Refill  . buPROPion (WELLBUTRIN SR) 200 MG 12 hr tablet Take 1 tablet (200 mg total) by mouth daily. 90 tablet 0  . Cholecalciferol (VITAMIN D3) 125 MCG (5000 UT) CAPS Take 1 capsule by mouth daily.    . fluticasone (FLONASE) 50 MCG/ACT nasal spray Place 1 spray into both nostrils daily.  2  . Multiple Vitamins-Minerals (WOMENS MULTIVITAMIN PLUS PO) Take 1 tablet by mouth daily.    . Turmeric 500 MG CAPS Take 1 capsule by mouth daily.     No current facility-administered medications for this visit.    PHYSICAL EXAMINATION: ECOG PERFORMANCE STATUS: 1 - Symptomatic but completely ambulatory  Vitals:   06/11/20 1444  BP: 138/68  Pulse: 63  Resp: 18  Temp: 97.9 F (36.6 C)  SpO2: 100%   Filed Weights   06/11/20 1444  Weight: 191 lb 3.2 oz (86.7 kg)     LABORATORY DATA:  I have reviewed the data as listed CMP Latest Ref Rng & Units 03/26/2019 03/09/2018 10/12/2017  Glucose 65 - 99 mg/dL 81 92 92  BUN 8 -  27 mg/dL 17 18 14   Creatinine 0.57 - 1.00 mg/dL 6.50 3.54  Sodium 134 - 144 mmol/L 145(H) 141 143  Potassium 3.5 - 5.2 mmol/L 4.1 4.7 4.3  Chloride 96 - 106 mmol/L 107(H) 102 100  CO2 20 - 29 mmol/L 25 26 25   Calcium 8.7 - 10.3 mg/dL 9.7 9.7 9.6  Total Protein 6.0 - 8.5 g/dL 6.4 6.6 6.7  Total Bilirubin 0.0 - 1.2 mg/dL 1.1 0.8 1.1  Alkaline Phos 39 - 117 IU/L 68 79 74  AST 0 - 40 IU/L 17 17 21   ALT 0 - 32 IU/L 14 20 29     Lab Results  Component Value Date   WBC 6.0 05/25/2017   HGB 13.6 05/25/2017   HCT 40.7 05/25/2017   MCV 91 05/25/2017   NEUTROABS 3.3 05/25/2017    ASSESSMENT & PLAN:  Ductal carcinoma in situ (DCIS) of left breast 11/21/2018:Screening detected left breast mammogram revealing calcifications: Biopsy revealed low-grade DCIS ER 100%, PR 100%Tis NX stage 0  Treatment plan: Comet clinical trial participation(active monitoring arm)  Breast cancer surveillance: Mammogram at [facility] 06/02/2020: Stable appearance of 0.2 cm group of amorphous calcifications in the left breast. No clinical or radiological evidence of progression of disease  Current treatment: Patient decided not to take tamoxifen therapy.  Stye on the left eyelid Return to clinic in 6 months for follow-up  No orders of the defined types were placed in this encounter.  The patient has a good understanding of the overall plan. she agrees with it. she will call with any problems that may develop before the next visit here.  Total time spent: 20 mins including face to face time and time spent for planning, charting and coordination of care  Sabas Sous, MD, MPH 06/11/2020  I, Kirt Boys Dorshimer, am acting as scribe for Dr. [name].  I have reviewed the above documentation for accuracy and completeness, and I agree with the above.

## 2020-06-11 ENCOUNTER — Other Ambulatory Visit: Payer: Self-pay

## 2020-06-11 ENCOUNTER — Inpatient Hospital Stay: Payer: BC Managed Care – PPO | Attending: Hematology and Oncology | Admitting: Hematology and Oncology

## 2020-06-11 ENCOUNTER — Encounter: Payer: Self-pay | Admitting: Medical Oncology

## 2020-06-11 DIAGNOSIS — H00016 Hordeolum externum left eye, unspecified eyelid: Secondary | ICD-10-CM

## 2020-06-11 DIAGNOSIS — Z006 Encounter for examination for normal comparison and control in clinical research program: Secondary | ICD-10-CM | POA: Insufficient documentation

## 2020-06-11 DIAGNOSIS — D0512 Intraductal carcinoma in situ of left breast: Secondary | ICD-10-CM

## 2020-06-11 DIAGNOSIS — Z170 Estrogen receptor positive status [ER+]: Secondary | ICD-10-CM | POA: Insufficient documentation

## 2020-06-11 NOTE — Progress Notes (Signed)
COMET: Active Surveillance:  74-monthfollow-up I met with patient, here alone, in the exam room for her 18 months on study follow-up. Patient states she is doing well and denies having any issues or concerns. Patient had her unilateral mammogram 06/02/2020 and is here for MD review and study assessments.Patient also had a bone density completed on same day as mammogram.  Patient continues with decision to not be on any endocrine therapy (ET) since diagnosis.   H&P: MD reviewed results of unilateral mammogram and bone density with patient. MD also answered any questions/concerns patient had.  Questionnaire: patient has not recieved the study questionnaires as of today. Patient elected to receive questionnaires in paper form and she was reminded that she should be receiving these shortly, and to expect them soon and to complete as she can, then place them in the mail using the postage paid envelope provided. Patient gave her verbal understanding to this.  Study Solicited Adverse Events: Patient denies having any new or worsening issues and no hospital admissions. No changes to adverse events at this visit.  Plan: Patient informed that next study visit will be in approximately six months time, after having a bilateral mammogram, to be seen in clinic. Patient thanked for her continued support of study and was encouraged to call with any questions or concerns she may have in the meantime.   MMaxwell Marion RN, BSN, [facility] 06/11/2020 3:00 PM  JVALICIA [id number]  Adverse Event Log  Study/Protocol: COMET Cycle: Baseline - 144-month Event Grade Onset Date Resolved Date Drug Name Attribution Treatment Comments  Hot flashes 1 Baseline resolved N/A History of None   Hypertension 2 Baseline 07/04/19 N/A History of None   Pain- bilateral knees 1 Baseline ongoing N/A History of Conmed   alopecia 1 Baseline ongoing N/A History of  None    multiple stiff joints- decreased range  of motion 1 Baseline ongoing N/A History of None            Hypertension 1 12/12/2019 ongoing N/A H/O baseline Gr 2 None   Hot flashes 1 ~ 07/2019 Ongoing  N/A H/O None Per pt. 1-2 small hot flashes in a day

## 2020-06-11 NOTE — Assessment & Plan Note (Signed)
11/21/2018:Screening detected left breast mammogram revealing calcifications: Biopsy revealed low-grade DCIS ER 100%, PR 100%Tis NX stage 0  Treatment plan: Comet clinical trial participation(active monitoring arm)  Breast cancer surveillance: 1.  Breast exam: 06/11/2020: Benign  2. Mammogram at [facility] 12/04/2019: Stable appearance of 0.3 cm group of amorphous calcifications in the left breast.  Current treatment: Patient decided not to take tamoxifen therapy.   Return to clinic in 6 months for follow-up

## 2020-06-23 ENCOUNTER — Ambulatory Visit (INDEPENDENT_AMBULATORY_CARE_PROVIDER_SITE_OTHER): Payer: BC Managed Care – PPO | Admitting: Family Medicine

## 2020-06-23 ENCOUNTER — Other Ambulatory Visit: Payer: Self-pay

## 2020-06-23 ENCOUNTER — Encounter (INDEPENDENT_AMBULATORY_CARE_PROVIDER_SITE_OTHER): Payer: Self-pay | Admitting: Family Medicine

## 2020-06-23 VITALS — BP 106/68 | HR 72 | Temp 98.1°F | Ht 66.0 in | Wt 181.0 lb

## 2020-06-23 DIAGNOSIS — Z6841 Body Mass Index (BMI) 40.0 and over, adult: Secondary | ICD-10-CM

## 2020-06-23 DIAGNOSIS — F3289 Other specified depressive episodes: Secondary | ICD-10-CM

## 2020-07-03 LAB — HM MAMMOGRAPHY

## 2020-07-04 ENCOUNTER — Other Ambulatory Visit (INDEPENDENT_AMBULATORY_CARE_PROVIDER_SITE_OTHER): Payer: Self-pay | Admitting: Family Medicine

## 2020-07-04 DIAGNOSIS — F3289 Other specified depressive episodes: Secondary | ICD-10-CM

## 2020-07-07 NOTE — Telephone Encounter (Signed)
Ok x 1

## 2020-07-07 NOTE — Telephone Encounter (Signed)
Would you like to refill? 

## 2020-07-08 NOTE — Progress Notes (Signed)
Chief Complaint:   OBESITY Lindsey [name] is here to discuss her progress with her obesity treatment plan along with follow-up of her obesity related diagnoses. Lindsey [name] is on keeping a food journal and adhering to recommended goals of 1600-1800 calories and 100+ grams of protein daily and states she is following her eating plan approximately 15-20% of the time. Lindsey [name] states she is walking for 40 minutes 7 times per week.  Today's visit was #: 29 Starting weight: 325 lbs Starting date: 06/04/2017 Today's weight: 181 lbs Today's date: 06/23/2020 Total lbs lost to date: 144 Total lbs lost since last in-office visit: 0  Interim History: Lindsey [name] has been struggling to maintain her weight in the last 3 months. Her motivation has waned down and she is struggling to get things back on track. She notes some binge eating episodes.  Subjective:   1. Other depression with emotional eating Lindsey [name] is stable on Wellbutrin, but she is struggling a bit more with non-hunger eating and indulging especially in the last month.  Assessment/Plan:   1. Other depression with emotional eating Emotional eating behaviors were discussed. Lindsey [name] is to continue to self-monitor and will continue Wellbutrin as is (no refill needed today). Orders and follow up as documented in patient record.   2. Obesity with current BMI of 29.2 Lindsey [name] is currently in the action stage of change. As such, her goal is to continue with weight loss efforts. She has agreed to keeping a food journal and adhering to recommended goals of 1600-1800 calories and 100+ grams of protein daily.   Exercise goals: As is.  Behavioral modification strategies: increasing lean protein intake, meal planning and cooking strategies and emotional eating strategies.  Lindsey [name] has agreed to follow-up with our clinic in 4 weeks. She was informed of the importance of frequent follow-up visits to maximize her success with intensive lifestyle modifications for her multiple health  conditions.   Objective:   Blood pressure 106/68, pulse 72, temperature 98.1 F (36.7 C), height 5\' 6"  (1.676 m), weight 181 lb (82.1 kg), SpO2 98 %. Body mass index is 29.21 kg/m.  General: Cooperative, alert, well developed, in no acute distress. HEENT: Conjunctivae and lids unremarkable. Cardiovascular: Regular rhythm.  Lungs: Normal work of breathing. Neurologic: No focal deficits.   Lab Results  Component Value Date   CREATININE 0.57 03/26/2019   BUN 17 03/26/2019   NA 145 (H) 03/26/2019   K 4.1 03/26/2019   CL 107 (H) 03/26/2019   CO2 25 03/26/2019   Lab Results  Component Value Date   ALT 14 03/26/2019   AST 17 03/26/2019   ALKPHOS 68 03/26/2019   BILITOT 1.1 03/26/2019   Lab Results  Component Value Date   HGBA1C 5.2 03/26/2019   HGBA1C 5.4 03/09/2018   HGBA1C 5.5 10/12/2017   HGBA1C 5.7 (H) 05/25/2017   Lab Results  Component Value Date   INSULIN 4.4 03/26/2019   INSULIN 10.0 03/09/2018   INSULIN 19.1 10/12/2017   INSULIN 44.2 (H) 05/25/2017   Lab Results  Component Value Date   TSH 2.070 05/25/2017   Lab Results  Component Value Date   CHOL 159 03/26/2019   HDL 66 03/26/2019   LDLCALC 81 03/26/2019   TRIG 59 03/26/2019   Lab Results  Component Value Date   WBC 6.0 05/25/2017   HGB 13.6 05/25/2017   HCT 40.7 05/25/2017   MCV 91 05/25/2017   No results found for: IRON, TIBC, FERRITIN  Attestation Statements:   Reviewed by  clinician on day of visit: allergies, medications, problem list, medical history, surgical history, family history, social history, and previous encounter notes.  Time spent on visit including pre-visit chart review and post-visit care and charting was 32 minutes.    I, Burt Knack, am acting as transcriptionist for Quillian Quince, MD.  I have reviewed the above documentation for accuracy and completeness, and I agree with the above. -  Quillian Quince, MD

## 2020-07-21 ENCOUNTER — Encounter (INDEPENDENT_AMBULATORY_CARE_PROVIDER_SITE_OTHER): Payer: Self-pay | Admitting: Family Medicine

## 2020-07-21 ENCOUNTER — Ambulatory Visit (INDEPENDENT_AMBULATORY_CARE_PROVIDER_SITE_OTHER): Payer: BC Managed Care – PPO | Admitting: Family Medicine

## 2020-07-21 ENCOUNTER — Other Ambulatory Visit: Payer: Self-pay

## 2020-07-21 VITALS — BP 130/81 | HR 60 | Temp 98.0°F | Ht 66.0 in | Wt 175.0 lb

## 2020-07-21 DIAGNOSIS — Z6841 Body Mass Index (BMI) 40.0 and over, adult: Secondary | ICD-10-CM | POA: Diagnosis not present

## 2020-07-21 DIAGNOSIS — F3289 Other specified depressive episodes: Secondary | ICD-10-CM | POA: Diagnosis not present

## 2020-07-21 MED ORDER — BUPROPION HCL ER (SR) 200 MG PO TB12: ORAL_TABLET | ORAL | 0 refills | Status: DC

## 2020-07-22 NOTE — Progress Notes (Signed)
Chief Complaint:   OBESITY Lynix is here to discuss her progress with her obesity treatment plan along with follow-up of her obesity related diagnoses. Chirsty is on keeping a food journal and adhering to recommended goals of 1600-1800 calories and 100+ grams of protein daily and states she is following her eating plan approximately 90% of the time. Camylle states she is walking and at the gym for 30 minutes 3 times per week.  Today's visit was #: 30 Starting weight: 325 lbs Starting date: 06/04/2017 Today's weight: 175 lbs Today's date: 07/21/2020 Total lbs lost to date: 150 Total lbs lost since last in-office visit: 6  Interim History: Joanie continues to do well with weight loss. She is going to do some celebration eating for a reunion. She is working on increasing exercise, but trying to not overdo it.  Subjective:   1. Other depression with emotional eating Lamerle is stable on Wellbutrin. She is doing well with diet and exercise, and she has limited her emotional eating behaviors.  Assessment/Plan:   1. Other depression with emotional eating Behavior modification techniques were discussed today to help Aryn deal with her emotional/non-hunger eating behaviors. We will refill Wellbutrin SR for 1 month, and will continue to monitor. Orders and follow up as documented in patient record.   - buPROPion (WELLBUTRIN SR) 200 MG 12 hr tablet; TAKE 1 TABLET(200 MG) BY MOUTH DAILY  Dispense: 30 tablet; Refill: 0  2. Obesity with current BMI 28.2 Velvie is currently in the action stage of change. As such, her goal is to continue with weight loss efforts. She has agreed to keeping a food journal and adhering to recommended goals of 1600-1800 calories and 100+ grams of protein daily.   Exercise goals: As is.  Behavioral modification strategies: increasing lean protein intake and meal planning and cooking strategies.  Jelitza has agreed to follow-up with our clinic in 4 weeks. She was informed of the  importance of frequent follow-up visits to maximize her success with intensive lifestyle modifications for her multiple health conditions.   Objective:   Blood pressure 130/81, pulse 60, temperature 98 F (36.7 C), height 5\' 6"  (1.676 m), weight 175 lb (79.4 kg), SpO2 98 %. Body mass index is 28.25 kg/m.  General: Cooperative, alert, well developed, in no acute distress. HEENT: Conjunctivae and lids unremarkable. Cardiovascular: Regular rhythm.  Lungs: Normal work of breathing. Neurologic: No focal deficits.   Lab Results  Component Value Date   CREATININE 0.57 03/26/2019   BUN 17 03/26/2019   NA 145 (H) 03/26/2019   K 4.1 03/26/2019   CL 107 (H) 03/26/2019   CO2 25 03/26/2019   Lab Results  Component Value Date   ALT 14 03/26/2019   AST 17 03/26/2019   ALKPHOS 68 03/26/2019   BILITOT 1.1 03/26/2019   Lab Results  Component Value Date   HGBA1C 5.2 03/26/2019   HGBA1C 5.4 03/09/2018   HGBA1C 5.5 10/12/2017   HGBA1C 5.7 (H) 05/25/2017   Lab Results  Component Value Date   INSULIN 4.4 03/26/2019   INSULIN 10.0 03/09/2018   INSULIN 19.1 10/12/2017   INSULIN 44.2 (H) 05/25/2017   Lab Results  Component Value Date   TSH 2.070 05/25/2017   Lab Results  Component Value Date   CHOL 159 03/26/2019   HDL 66 03/26/2019   LDLCALC 81 03/26/2019   TRIG 59 03/26/2019   Lab Results  Component Value Date   WBC 6.0 05/25/2017   HGB 13.6 05/25/2017  HCT 40.7 05/25/2017   MCV 91 05/25/2017   No results found for: IRON, TIBC, FERRITIN  Attestation Statements:   Reviewed by clinician on day of visit: allergies, medications, problem list, medical history, surgical history, family history, social history, and previous encounter notes.  Time spent on visit including pre-visit chart review and post-visit care and charting was 32 minutes.    I, Burt Knack, am acting as transcriptionist for Quillian Quince, MD.  I have reviewed the above documentation for accuracy and  completeness, and I agree with the above. -  Quillian Quince, MD

## 2020-08-20 ENCOUNTER — Encounter (INDEPENDENT_AMBULATORY_CARE_PROVIDER_SITE_OTHER): Payer: Self-pay | Admitting: Physician Assistant

## 2020-08-20 ENCOUNTER — Ambulatory Visit (INDEPENDENT_AMBULATORY_CARE_PROVIDER_SITE_OTHER): Payer: BC Managed Care – PPO | Admitting: Physician Assistant

## 2020-08-20 ENCOUNTER — Other Ambulatory Visit: Payer: Self-pay

## 2020-08-20 VITALS — BP 114/71 | HR 60 | Temp 97.8°F | Ht 66.0 in | Wt 174.0 lb

## 2020-08-20 DIAGNOSIS — R7303 Prediabetes: Secondary | ICD-10-CM | POA: Diagnosis not present

## 2020-08-20 DIAGNOSIS — F3289 Other specified depressive episodes: Secondary | ICD-10-CM | POA: Diagnosis not present

## 2020-08-20 DIAGNOSIS — Z9189 Other specified personal risk factors, not elsewhere classified: Secondary | ICD-10-CM | POA: Diagnosis not present

## 2020-08-20 DIAGNOSIS — E559 Vitamin D deficiency, unspecified: Secondary | ICD-10-CM | POA: Diagnosis not present

## 2020-08-20 DIAGNOSIS — Z6838 Body mass index (BMI) 38.0-38.9, adult: Secondary | ICD-10-CM

## 2020-08-20 MED ORDER — BUPROPION HCL ER (SR) 200 MG PO TB12: ORAL_TABLET | ORAL | 0 refills | Status: DC

## 2020-08-21 LAB — COMPREHENSIVE METABOLIC PANEL
ALT: 14 IU/L (ref 0–32)
AST: 19 IU/L (ref 0–40)
Albumin/Globulin Ratio: 1.9 (ref 1.2–2.2)
Albumin: 4.5 g/dL (ref 3.8–4.8)
Alkaline Phosphatase: 78 IU/L (ref 44–121)
BUN/Creatinine Ratio: 34 — ABNORMAL HIGH (ref 12–28)
BUN: 22 mg/dL (ref 8–27)
Bilirubin Total: 0.6 mg/dL (ref 0.0–1.2)
CO2: 24 mmol/L (ref 20–29)
Calcium: 9.9 mg/dL (ref 8.7–10.3)
Chloride: 104 mmol/L (ref 96–106)
Creatinine, Ser: 0.65 mg/dL (ref 0.57–1.00)
Globulin, Total: 2.4 g/dL (ref 1.5–4.5)
Glucose: 84 mg/dL (ref 65–99)
Potassium: 4.4 mmol/L (ref 3.5–5.2)
Sodium: 145 mmol/L — ABNORMAL HIGH (ref 134–144)
Total Protein: 6.9 g/dL (ref 6.0–8.5)
eGFR: 98 mL/min/{1.73_m2} (ref >59)

## 2020-08-21 LAB — HEMOGLOBIN A1C
Est. average glucose Bld gHb Est-mCnc: 103 mg/dL
Hgb A1c MFr Bld: 5.2 % (ref 4.8–5.6)

## 2020-08-21 LAB — INSULIN, RANDOM: INSULIN: 7.3 u[IU]/mL (ref 2.6–24.9)

## 2020-08-21 LAB — VITAMIN D 25 HYDROXY (VIT D DEFICIENCY, FRACTURES): Vit D, 25-Hydroxy: 69.5 ng/mL (ref 30.0–100.0)

## 2020-08-25 NOTE — Progress Notes (Signed)
Chief Complaint:   OBESITY Lindsey [name] is here to discuss her progress with her obesity treatment plan along with follow-up of her obesity related diagnoses. Lindsey [name] is on keeping a food journal and adhering to recommended goals of 1600-1800 calories and 100+ grams of protein daily and states she is following her eating plan approximately 60% of the time. Lindsey [name] states she is walking for 30-40 minutes 7 times per week, and working out 3-4 times per week.  Today's visit was #: 31 Starting weight: 325 lbs Starting date: 06/04/2017 Today's weight: 174 lbs Today's date: 08/20/2020 Total lbs lost to date: 151 Total lbs lost since last in-office visit: 1  Interim History: Lindsey [name] reports that the past month she has been traveling and also had some bad days at home. She sometimes overeats her calories. She is having a difficult time with portion control at times.  Subjective:   1. Pre-diabetes Lindsey [name]'s last A1c was 5.2. She is not on medication, and she reports polyphagia.  2. Vitamin D deficiency Lindsey [name] is on Vit D, and she is tolerating it well.  3. Other depression with emotional eating Lindsey [name] does not know if Wellbutrin is helping with cravings. We discussed increasing her dose and she declines. Also discussed other medication options to help with portion control.   4. At risk for diabetes mellitus Lindsey [name] is at higher than average risk for developing diabetes due to obesity.   Assessment/Plan:   1. Pre-diabetes Lindsey [name] will continue to work on weight loss, exercise, and decreasing simple carbohydrates to help decrease the risk of diabetes. We will check labs today.  - Comprehensive metabolic panel - Hemoglobin A1c - Insulin, random  2. Vitamin D deficiency Low Vitamin D level contributes to fatigue and are associated with obesity, breast, and colon cancer. We will check labs today. Lindsey [name] will follow-up for routine testing of Vitamin D, at least 2-3 times per year to avoid over-replacement.  - VITAMIN  D 25 Hydroxy (Vit-D Deficiency, Fractures)  3. Other depression with emotional eating Behavior modification techniques were discussed today to help Lindsey [name] deal with her emotional/non-hunger eating behaviors. We will Wellbutrin SR for 1 month. Orders and follow up as documented in patient record.   - buPROPion (WELLBUTRIN SR) 200 MG 12 hr tablet; TAKE 1 TABLET(200 MG) BY MOUTH DAILY  Dispense: 30 tablet; Refill: 0  4. At risk for diabetes mellitus Lindsey [name] was given approximately 15 minutes of diabetes education and counseling today. We discussed intensive lifestyle modifications today with an emphasis on weight loss as well as increasing exercise and decreasing simple carbohydrates in her diet. We also reviewed medication options with an emphasis on risk versus benefit of those discussed.   Repetitive spaced learning was employed today to elicit superior memory formation and behavioral change.  5. Class 2 severe obesity with serious comorbidity and body mass index (BMI) of 38.0 to 38.9 in adult, unspecified obesity type [facility]) Lindsey [name] is currently in the action stage of change. As such, her goal is to continue with weight loss efforts. She has agreed to keeping a food journal and adhering to recommended goals of 1800 calories and 120 grams of protein daily.   Exercise goals: As is.  Behavioral modification strategies: increasing lean protein intake and better snacking choices.  Lindsey [name] has agreed to follow-up with our clinic in 4 weeks. She was informed of the importance of frequent follow-up visits to maximize her success with intensive lifestyle modifications for her multiple health conditions.   Lindsey [name] was  informed we would discuss her lab results at her next visit unless there is a critical issue that needs to be addressed sooner. Lindsey [name] agreed to keep her next visit at the agreed upon time to discuss these results.  Objective:   Blood pressure 114/71, pulse 60, temperature 97.8 F (36.6 C), height 5'  6" (1.676 m), weight 174 lb (78.9 kg), SpO2 99 %. Body mass index is 28.08 kg/m.  General: Cooperative, alert, well developed, in no acute distress. HEENT: Conjunctivae and lids unremarkable. Cardiovascular: Regular rhythm.  Lungs: Normal work of breathing. Neurologic: No focal deficits.   Lab Results  Component Value Date   CREATININE 0.65 08/20/2020   BUN 22 08/20/2020   NA 145 (H) 08/20/2020   K 4.4 08/20/2020   CL 104 08/20/2020   CO2 24 08/20/2020   Lab Results  Component Value Date   ALT 14 08/20/2020   AST 19 08/20/2020   ALKPHOS 78 08/20/2020   BILITOT 0.6 08/20/2020   Lab Results  Component Value Date   HGBA1C 5.2 08/20/2020   HGBA1C 5.2 03/26/2019   HGBA1C 5.4 03/09/2018   HGBA1C 5.5 10/12/2017   HGBA1C 5.7 (H) 05/25/2017   Lab Results  Component Value Date   INSULIN 7.3 08/20/2020   INSULIN 4.4 03/26/2019   INSULIN 10.0 03/09/2018   INSULIN 19.1 10/12/2017   INSULIN 44.2 (H) 05/25/2017   Lab Results  Component Value Date   TSH 2.070 05/25/2017   Lab Results  Component Value Date   CHOL 159 03/26/2019   HDL 66 03/26/2019   LDLCALC 81 03/26/2019   TRIG 59 03/26/2019   Lab Results  Component Value Date   WBC 6.0 05/25/2017   HGB 13.6 05/25/2017   HCT 40.7 05/25/2017   MCV 91 05/25/2017   No results found for: IRON, TIBC, FERRITIN  Attestation Statements:   Reviewed by clinician on day of visit: allergies, medications, problem list, medical history, surgical history, family history, social history, and previous encounter notes.   Trude Mcburney, am acting as transcriptionist for [employer], PA-C.  I have reviewed the above documentation for accuracy and completeness, and I agree with the above. Alois Cliche, PA-C

## 2020-08-26 ENCOUNTER — Encounter (INDEPENDENT_AMBULATORY_CARE_PROVIDER_SITE_OTHER): Payer: Self-pay | Admitting: Family Medicine

## 2020-08-28 NOTE — Telephone Encounter (Signed)
Patient was last seen by Lindsey [name]

## 2020-09-17 ENCOUNTER — Encounter (INDEPENDENT_AMBULATORY_CARE_PROVIDER_SITE_OTHER): Payer: Self-pay | Admitting: Family Medicine

## 2020-09-17 ENCOUNTER — Ambulatory Visit (INDEPENDENT_AMBULATORY_CARE_PROVIDER_SITE_OTHER): Payer: BC Managed Care – PPO | Admitting: Family Medicine

## 2020-09-17 ENCOUNTER — Other Ambulatory Visit: Payer: Self-pay

## 2020-09-17 VITALS — BP 131/82 | HR 67 | Temp 98.5°F | Ht 66.0 in | Wt 175.0 lb

## 2020-09-17 DIAGNOSIS — E559 Vitamin D deficiency, unspecified: Secondary | ICD-10-CM

## 2020-09-17 DIAGNOSIS — Z9189 Other specified personal risk factors, not elsewhere classified: Secondary | ICD-10-CM | POA: Diagnosis not present

## 2020-09-17 DIAGNOSIS — Z6841 Body Mass Index (BMI) 40.0 and over, adult: Secondary | ICD-10-CM | POA: Diagnosis not present

## 2020-09-17 DIAGNOSIS — F3289 Other specified depressive episodes: Secondary | ICD-10-CM | POA: Diagnosis not present

## 2020-09-17 MED ORDER — BUPROPION HCL ER (SR) 200 MG PO TB12: ORAL_TABLET | ORAL | 0 refills | Status: DC

## 2020-09-19 LAB — FECAL OCCULT BLOOD, GUAIAC: Fecal Occult Blood: NEGATIVE

## 2020-09-19 LAB — LIPID PANEL
Cholesterol: 178 (ref 0–200)
HDL: 102 — AB (ref 35–70)
LDL Cholesterol: 92
LDl/HDL Ratio: 2.3
Triglycerides: 50 (ref 40–160)

## 2020-09-19 LAB — TSH: TSH: 2.88 (ref 0.41–5.90)

## 2020-09-19 LAB — IRON,TIBC AND FERRITIN PANEL: Ferritin: 102

## 2020-09-24 LAB — LIPID PANEL
Cholesterol: 178 (ref 0–200)
HDL: 76 — AB (ref 35–70)
LDL Cholesterol: 92

## 2020-09-24 LAB — FECAL OCCULT BLOOD, IMMUNOCHEMICAL: IFOBT: NEGATIVE

## 2020-09-24 NOTE — Progress Notes (Signed)
Chief Complaint:   OBESITY Lindsey [name] is here to discuss her progress with her obesity treatment plan along with follow-up of her obesity related diagnoses. Lindsey [name] is on keeping a food journal and adhering to recommended goals of 1800 calories and 120 grams of protein daily and states she is following her eating plan approximately 80% of the time. Lindsey [name] states she is walking and exercising on the gym machine for 30 minutes 2-3 times per week.  Today's visit was #: 32  Starting weight: 325 lbs Starting date: 06/04/2017 Today's weight: 175 lbs Today's date: 09/17/2020 Total lbs lost to date: 150 Total lbs lost since last in-office visit: 0  Interim History: Lindsey [name] has struggled more with cravings and feeling deprived. She has gotten off track and she goes back into an all or nothing pattern. She is retaining fluid today, and she has actually lost 1-2 lbs of fat since her last visit.  Subjective:   1. Vitamin D deficiency Lindsey [name] is stable on Vit D, and last level was at goal.  2. Other depression with emotional eating Lindsey [name] is working on diet and exercise, and is struggling with some emotional eating behaviors.   3. At risk for dehydration Lindsey [name] is at risk for dehydration due to inadequate water intake.  Assessment/Plan:   1. Vitamin D deficiency Low Vitamin D level contributes to fatigue and are associated with obesity, breast, and colon cancer. Lindsey [name] will continue Vitamin D 5,000 IU daily and will follow-up for routine testing of Vitamin D, at least 2-3 times per year to avoid over-replacement.  2. Other depression with emotional eating Emotional eating behavior strategies were discussed today to help Lindsey [name] deal with her emotional/non-hunger eating behaviors. We will refill Wellbutrin SR for 1 month. Orders and follow up as documented in patient record.   - buPROPion (WELLBUTRIN SR) 200 MG 12 hr tablet; TAKE 1 TABLET(200 MG) BY MOUTH DAILY  Dispense: 30 tablet; Refill: 0  3. At risk for  dehydration Lindsey [name] was given approximately 15 minutes dehydration prevention counseling today. Lindsey [name] is at risk for dehydration due to weight loss and current medication(s). She was encouraged to hydrate and monitor fluid status to avoid dehydration as well as weight loss plateaus.   4. Obesity with current BMI 28.4 Lindsey [name] is currently in the action stage of change. As such, her goal is to continue with weight loss efforts. She has agreed to keeping a food journal and adhering to recommended goals of 1800 calories and 120+ grams of protein daily.   Exercise goals: As is.  Behavioral modification strategies: increasing lean protein intake, increasing water intake, and emotional eating strategies.  Lindsey [name] has agreed to follow-up with our clinic in 4 to 5 weeks. She was informed of the importance of frequent follow-up visits to maximize her success with intensive lifestyle modifications for her multiple health conditions.   Objective:   Blood pressure 131/82, pulse 67, temperature 98.5 F (36.9 C), height 5\' 6"  (1.676 m), weight 175 lb (79.4 kg), SpO2 98 %. Body mass index is 28.25 kg/m.  General: Cooperative, alert, well developed, in no acute distress. HEENT: Conjunctivae and lids unremarkable. Cardiovascular: Regular rhythm.  Lungs: Normal work of breathing. Neurologic: No focal deficits.   Lab Results  Component Value Date   CREATININE 0.65 08/20/2020   BUN 22 08/20/2020   NA 145 (H) 08/20/2020   K 4.4 08/20/2020   CL 104 08/20/2020   CO2 24 08/20/2020   Lab Results  Component Value Date  ALT 14 08/20/2020   AST 19 08/20/2020   ALKPHOS 78 08/20/2020   BILITOT 0.6 08/20/2020   Lab Results  Component Value Date   HGBA1C 5.2 08/20/2020   HGBA1C 5.2 03/26/2019   HGBA1C 5.4 03/09/2018   HGBA1C 5.5 10/12/2017   HGBA1C 5.7 (H) 05/25/2017   Lab Results  Component Value Date   INSULIN 7.3 08/20/2020   INSULIN 4.4 03/26/2019   INSULIN 10.0 03/09/2018   INSULIN 19.1 10/12/2017    INSULIN 44.2 (H) 05/25/2017   Lab Results  Component Value Date   TSH 2.070 05/25/2017   Lab Results  Component Value Date   CHOL 159 03/26/2019   HDL 66 03/26/2019   LDLCALC 81 03/26/2019   TRIG 59 03/26/2019   Lab Results  Component Value Date   VD25OH 69.5 08/20/2020   VD25OH 64.7 06/26/2019   VD25OH 56.6 03/26/2019   Lab Results  Component Value Date   WBC 6.0 05/25/2017   HGB 13.6 05/25/2017   HCT 40.7 05/25/2017   MCV 91 05/25/2017   No results found for: IRON, TIBC, FERRITIN  Attestation Statements:   Reviewed by clinician on day of visit: allergies, medications, problem list, medical history, surgical history, family history, social history, and previous encounter notes.   I, Burt Knack, am acting as transcriptionist for Quillian Quince, MD.  I have reviewed the above documentation for accuracy and completeness, and I agree with the above. -  Quillian Quince, MD

## 2020-09-29 ENCOUNTER — Encounter (INDEPENDENT_AMBULATORY_CARE_PROVIDER_SITE_OTHER): Payer: Self-pay | Admitting: Family Medicine

## 2020-09-29 NOTE — Progress Notes (Signed)
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

## 2020-09-30 ENCOUNTER — Encounter (INDEPENDENT_AMBULATORY_CARE_PROVIDER_SITE_OTHER): Payer: Self-pay | Admitting: Family Medicine

## 2020-10-08 ENCOUNTER — Telehealth: Payer: Self-pay

## 2020-10-08 NOTE — Telephone Encounter (Signed)
RN returned call.  Voicemail left for call back.  

## 2020-10-09 ENCOUNTER — Telehealth: Payer: Self-pay

## 2020-10-09 DIAGNOSIS — N6320 Unspecified lump in the left breast, unspecified quadrant: Secondary | ICD-10-CM

## 2020-10-09 NOTE — Telephone Encounter (Signed)
Patient called to report finding of new lump to left breast.   History of DCIS (Left Breast).   Patient found lump to left breast X 2 days ago.  Reports size is smaller than a marble, but bigger than a pea.  Denies any redness or streaking to area.   MD recommendations for patient to have left breast diagnostic mammogram with ultrasound.   RN contacted call center for [facility], and patient was scheduled for 11/10/2020 @ 8:45.  RN requested sooner appointment, transferred to [facility] location.  [facility] is able to see patient on 8/1 @ 10:15 am.  RN notified patient, order faxed.

## 2020-10-14 ENCOUNTER — Encounter (INDEPENDENT_AMBULATORY_CARE_PROVIDER_SITE_OTHER): Payer: Self-pay | Admitting: Family Medicine

## 2020-10-14 NOTE — Progress Notes (Signed)
No changes needed 

## 2020-10-14 NOTE — Progress Notes (Unsigned)
#  D Mammogram shows stable 0.2 cm group of amorphous calcifications with associated cylinder biopsy clip in the left breast at 2 o'clock posterior depth 15 cm from nipple

## 2020-10-15 ENCOUNTER — Ambulatory Visit (INDEPENDENT_AMBULATORY_CARE_PROVIDER_SITE_OTHER): Payer: BC Managed Care – PPO | Admitting: Family Medicine

## 2020-10-20 ENCOUNTER — Encounter: Payer: Self-pay | Admitting: Hematology and Oncology

## 2020-10-21 ENCOUNTER — Other Ambulatory Visit: Payer: Self-pay

## 2020-10-21 ENCOUNTER — Ambulatory Visit (INDEPENDENT_AMBULATORY_CARE_PROVIDER_SITE_OTHER): Payer: BC Managed Care – PPO | Admitting: Family Medicine

## 2020-10-21 ENCOUNTER — Encounter (INDEPENDENT_AMBULATORY_CARE_PROVIDER_SITE_OTHER): Payer: Self-pay | Admitting: Family Medicine

## 2020-10-21 VITALS — BP 118/76 | HR 69 | Temp 98.7°F | Ht 66.0 in | Wt 171.0 lb

## 2020-10-21 DIAGNOSIS — Z6841 Body Mass Index (BMI) 40.0 and over, adult: Secondary | ICD-10-CM | POA: Diagnosis not present

## 2020-10-21 DIAGNOSIS — F3289 Other specified depressive episodes: Secondary | ICD-10-CM

## 2020-10-21 MED ORDER — BUPROPION HCL ER (SR) 200 MG PO TB12: ORAL_TABLET | ORAL | 0 refills | Status: DC

## 2020-10-23 NOTE — Progress Notes (Signed)
Chief Complaint:   OBESITY Ashelynn is here to discuss her progress with her obesity treatment plan along with follow-up of her obesity related diagnoses. Princesa is on keeping a food journal and adhering to recommended goals of 1800 calories and 120+ grams of protein daily and states she is following her eating plan approximately 85% of the time. Nyisha states she is walking 7 times per week, and doing arm and leg weights 2-3 times per week.   Today's visit was #: 33 Starting weight: 325 lbs Starting date: 06/04/2017 Today's weight: 171 lbs Today's date: 10/21/2020 Total lbs lost to date: 154 Total lbs lost since last in-office visit: 4  Interim History: Gerlene continues to do well with weight loss. She is making healthier choices most of the time. She is trying to stay active and she is working on maintaining her muscle mass. She is concerned about her loose skin after so much weight loss.  Subjective:   1. Other depression with emotional eating Nakota is stable on Wellbutrin and she is doing well minimizing emotional eating behaviors. No elevated blood pressure or insomnia were noted.  Assessment/Plan:   1. Other depression with emotional eating Behavior modification techniques were discussed today to help Mionna deal with her emotional/non-hunger eating behaviors. We will refill Wellbutrin SR for 1 month. Orders and follow up as documented in patient record.   - buPROPion (WELLBUTRIN SR) 200 MG 12 hr tablet; TAKE 1 TABLET(200 MG) BY MOUTH DAILY  Dispense: 30 tablet; Refill: 0  2. Obesity with current BMI 27.6 Daniel is currently in the action stage of change. As such, her goal is to continue with weight loss efforts. She has agreed to keeping a food journal and adhering to recommended goals of 1800 calories and 120 grams of protein daily.   We will refer Erskine Squibb to Dr. Foster Simpson, DO for evaluation and options.  - Ambulatory referral to Plastic Surgery  Exercise goals: As is, and core  strengthening exercises were discussed.  Behavioral modification strategies: increasing lean protein intake and meal planning and cooking strategies.  Heather has agreed to follow-up with our clinic in 4 to 5 weeks. She was informed of the importance of frequent follow-up visits to maximize her success with intensive lifestyle modifications for her multiple health conditions.   Objective:   Blood pressure 118/76, pulse 69, temperature 98.7 F (37.1 C), height 5\' 6"  (1.676 m), weight 171 lb (77.6 kg), SpO2 99 %. Body mass index is 27.6 kg/m.  General: Cooperative, alert, well developed, in no acute distress. HEENT: Conjunctivae and lids unremarkable. Cardiovascular: Regular rhythm.  Lungs: Normal work of breathing. Neurologic: No focal deficits.   Lab Results  Component Value Date   CREATININE 0.65 08/20/2020   BUN 22 08/20/2020   NA 145 (H) 08/20/2020   K 4.4 08/20/2020   CL 104 08/20/2020   CO2 24 08/20/2020   Lab Results  Component Value Date   ALT 14 08/20/2020   AST 19 08/20/2020   ALKPHOS 78 08/20/2020   BILITOT 0.6 08/20/2020   Lab Results  Component Value Date   HGBA1C 5.2 08/20/2020   HGBA1C 5.2 03/26/2019   HGBA1C 5.4 03/09/2018   HGBA1C 5.5 10/12/2017   HGBA1C 5.7 (H) 05/25/2017   Lab Results  Component Value Date   INSULIN 7.3 08/20/2020   INSULIN 4.4 03/26/2019   INSULIN 10.0 03/09/2018   INSULIN 19.1 10/12/2017   INSULIN 44.2 (H) 05/25/2017   Lab Results  Component Value Date  TSH 2.88 09/19/2020   Lab Results  Component Value Date   CHOL 178 09/24/2020   HDL 76 (A) 09/24/2020   LDLCALC 92 09/24/2020   TRIG 50 09/19/2020   Lab Results  Component Value Date   VD25OH 69.5 08/20/2020   VD25OH 64.7 06/26/2019   VD25OH 56.6 03/26/2019   Lab Results  Component Value Date   WBC 6.0 05/25/2017   HGB 13.6 05/25/2017   HCT 40.7 05/25/2017   MCV 91 05/25/2017   Lab Results  Component Value Date   FERRITIN 102.0 09/19/2020   Attestation  Statements:   Reviewed by clinician on day of visit: allergies, medications, problem list, medical history, surgical history, family history, social history, and previous encounter notes.  Time spent on visit including pre-visit chart review and post-visit care and charting was 42 minutes.    I, Burt Knack, am acting as transcriptionist for Quillian Quince, MD.  I have reviewed the above documentation for accuracy and completeness, and I agree with the above. -  Quillian Quince, MD

## 2020-11-26 ENCOUNTER — Ambulatory Visit (INDEPENDENT_AMBULATORY_CARE_PROVIDER_SITE_OTHER): Payer: BC Managed Care – PPO | Admitting: Family Medicine

## 2020-11-26 ENCOUNTER — Other Ambulatory Visit: Payer: Self-pay

## 2020-11-26 ENCOUNTER — Encounter (INDEPENDENT_AMBULATORY_CARE_PROVIDER_SITE_OTHER): Payer: Self-pay | Admitting: Family Medicine

## 2020-11-26 VITALS — BP 120/76 | HR 76 | Temp 98.1°F | Ht 66.0 in | Wt 170.0 lb

## 2020-11-26 DIAGNOSIS — F3289 Other specified depressive episodes: Secondary | ICD-10-CM | POA: Diagnosis not present

## 2020-11-26 DIAGNOSIS — Z6841 Body Mass Index (BMI) 40.0 and over, adult: Secondary | ICD-10-CM | POA: Diagnosis not present

## 2020-11-26 MED ORDER — BUPROPION HCL ER (SR) 200 MG PO TB12: ORAL_TABLET | ORAL | 0 refills | Status: DC

## 2020-11-26 NOTE — Progress Notes (Signed)
Chief Complaint:   OBESITY Lindsey [name] is here to discuss her progress with her obesity treatment plan along with follow-up of her obesity related diagnoses. Lindsey [name] is on keeping a food journal and adhering to recommended goals of 1800 calories and 120 grams of protein daily and states she is following her eating plan approximately 85% of the time. Lindsey [name] states she is walking for 40 minutes 7 times per week, and weight 3 times per week.  Today's visit was #: 34 Starting weight: 325 lbs Starting date: 06/04/2017 Today's weight: 170 lbs Today's date: 11/26/2020 Total lbs lost to date: 155 Total lbs lost since last in-office visit: 1  Interim History: Lindsey [name] continues to do well with weight loss. She has had some extra sugar and carbohydrate temptations recently and is disappointed in her weight loss this visit.  Subjective:   1. Other depression with emotional eating Lindsey [name] is stable on her medications and she is working on decreasing emotional eating behaviors. She is stressed about losing less weight than she hoped and she is feeling very discouraged despite her excellent progress overall.  Assessment/Plan:   1. Other depression with emotional eating Behavior modification techniques were discussed today to help Lindsey [name] deal with her emotional/non-hunger eating behaviors.  I offered to increase her Wellbutrin but she would like to hold off on this for now.We will refill Wellbutrin SR 200 mg q daily for 1 month. Lindsey [name] was offered support and encouragement. Orders and follow up as documented in patient record.   - buPROPion (WELLBUTRIN SR) 200 MG 12 hr tablet; TAKE 1 TABLET(200 MG) BY MOUTH DAILY  Dispense: 30 tablet; Refill: 0  2. Obesity with current BMI 27.5 Lindsey [name] is currently in the action stage of change. As such, her goal is to continue with weight loss efforts. She has agreed to keeping a food journal and adhering to recommended goals of 1800 calories and 120 grams of protein daily.   Exercise  goals: As is.  Behavioral modification strategies: decreasing simple carbohydrates and dealing with temptations as well as avoiding "all or nothing" thinking.  Lindsey [name] has agreed to follow-up with our clinic in 4 to 6 weeks. She was informed of the importance of frequent follow-up visits to maximize her success with intensive lifestyle modifications for her multiple health conditions.   Objective:   Blood pressure 120/76, pulse 76, temperature 98.1 F (36.7 C), height 5\' 6"  (1.676 m), weight 170 lb (77.1 kg), SpO2 98 %. Body mass index is 27.44 kg/m.  General: Cooperative, alert, well developed, in no acute distress. HEENT: Conjunctivae and lids unremarkable. Cardiovascular: Regular rhythm.  Lungs: Normal work of breathing. Neurologic: No focal deficits.   Lab Results  Component Value Date   CREATININE 0.65 08/20/2020   BUN 22 08/20/2020   NA 145 (H) 08/20/2020   K 4.4 08/20/2020   CL 104 08/20/2020   CO2 24 08/20/2020   Lab Results  Component Value Date   ALT 14 08/20/2020   AST 19 08/20/2020   ALKPHOS 78 08/20/2020   BILITOT 0.6 08/20/2020   Lab Results  Component Value Date   HGBA1C 5.2 08/20/2020   HGBA1C 5.2 03/26/2019   HGBA1C 5.4 03/09/2018   HGBA1C 5.5 10/12/2017   HGBA1C 5.7 (H) 05/25/2017   Lab Results  Component Value Date   INSULIN 7.3 08/20/2020   INSULIN 4.4 03/26/2019   INSULIN 10.0 03/09/2018   INSULIN 19.1 10/12/2017   INSULIN 44.2 (H) 05/25/2017   Lab Results  Component Value Date  TSH 2.88 09/19/2020   Lab Results  Component Value Date   CHOL 178 09/24/2020   HDL 76 (A) 09/24/2020   LDLCALC 92 09/24/2020   TRIG 50 09/19/2020   Lab Results  Component Value Date   VD25OH 69.5 08/20/2020   VD25OH 64.7 06/26/2019   VD25OH 56.6 03/26/2019   Lab Results  Component Value Date   WBC 6.0 05/25/2017   HGB 13.6 05/25/2017   HCT 40.7 05/25/2017   MCV 91 05/25/2017   Lab Results  Component Value Date   FERRITIN 102.0 09/19/2020    Attestation Statements:   Reviewed by clinician on day of visit: allergies, medications, problem list, medical history, surgical history, family history, social history, and previous encounter notes.  Time spent on visit including pre-visit chart review and post-visit care and charting was 46 minutes.    I, Burt Knack, am acting as transcriptionist for Quillian Quince, MD.  I have reviewed the above documentation for accuracy and completeness, and I agree with the above. -  Quillian Quince, MD

## 2020-12-15 ENCOUNTER — Telehealth: Payer: Self-pay | Admitting: Medical Oncology

## 2020-12-15 DIAGNOSIS — D0512 Intraductal carcinoma in situ of left breast: Secondary | ICD-10-CM

## 2020-12-15 NOTE — Telephone Encounter (Signed)
AFT - 25: COMPARING AN OPERATION TO MONITORING, WITH OR WITHOUT ENDOCRINE THERAPY (COMET) FOR LOW RISK DCIS: A PHASE III PROSPECTIVE RANDOMIZED TRIAL   Outgoing call: 2:31 Was reviewing patient's chart for next study visit and mammogram to be scheduled and noticed patient already had bilateral mammogram completed in August, out of window, as it relates to study calendar. Call to patient to inquire availability for follow-up to see Dr. Pamelia Hoit, due for follow-up for 24 month study visit. Patient informed me that she felt a lump and asked to have mammogram sooner to evaluate. Patient reports that she was told that it was "fatty tissue." Patient provided me with her availability and message sent to schedulers to schedule appt with Dr. Pamelia Hoit. Patient also informed that at this visit, study has a research lab draw required and she will have lab appointment prior to MD appointment. Patient gave her verbal understanding and denies questions at this time. I thanked patient for her time and provided her with my new contact information and encouraged her to call with questions.  Rexene Edison, RN, BSN, [facility] Clinical Research 12/15/2020 2:57 PM

## 2020-12-24 ENCOUNTER — Telehealth: Payer: Self-pay | Admitting: Medical Oncology

## 2020-12-24 NOTE — Progress Notes (Signed)
Patient Care Team: Wilder Glade, MD as PCP - General (Family Medicine) Rexene Edison, RN as Registered Nurse (Medical Oncology)  DIAGNOSIS:    ICD-10-CM   1. Ductal carcinoma in situ (DCIS) of left breast  D05.12       SUMMARY OF ONCOLOGIC HISTORY: Oncology History  Ductal carcinoma in situ (DCIS) of left breast  11/21/2018 Initial Diagnosis   Routine screening mammogram detected calcifications in the left breast. Biopsy showed low-grade DCIS, ER+ 100%, PR+ 100%.      CHIEF COMPLIANT: Follow-up of left breast DCIS  INTERVAL HISTORY: Lindsey [name] is a 65 y.o. with above-mentioned history of  left breast DCIS. She is a participant in the COMET clinical trial and was randomized to the active surveillance arm. She presents to the clinic today for follow-up.  She has felt lobularity in the left breast which was evaluated by mammogram and ultrasound and was felt to be a fat lobule.  She is also felt soreness in the left breast. She complains of muscle stiffness, trigger fingers very occasional hot flashes.  ALLERGIES:  is allergic to lactose intolerance (gi), sulfa antibiotics, and amoxicillin.  MEDICATIONS:  Current Outpatient Medications  Medication Sig Dispense Refill   BIOTIN PO Take 1 each by mouth daily.     buPROPion (WELLBUTRIN SR) 200 MG 12 hr tablet TAKE 1 TABLET(200 MG) BY MOUTH DAILY 30 tablet 0   Cholecalciferol (VITAMIN D3) 125 MCG (5000 UT) CAPS Take 1 capsule by mouth daily.     fluticasone (FLONASE) 50 MCG/ACT nasal spray Place 1 spray into both nostrils daily.  2   Multiple Vitamins-Minerals (WOMENS MULTIVITAMIN PLUS PO) Take 1 tablet by mouth daily.     Turmeric 500 MG CAPS Take 1 capsule by mouth daily.     No current facility-administered medications for this visit.    PHYSICAL EXAMINATION: ECOG PERFORMANCE STATUS: 1 - Symptomatic but completely ambulatory  There were no vitals filed for this visit. There were no vitals filed for this  visit.  BREAST: No palpable masses or nodules in either right or left breasts. No palpable axillary supraclavicular or infraclavicular adenopathy no breast tenderness or nipple discharge. (exam performed in the presence of a chaperone)  LABORATORY DATA:  I have reviewed the data as listed CMP Latest Ref Rng & Units 08/20/2020 03/26/2019 03/09/2018  Glucose 65 - 99 mg/dL 84 81 92  BUN 8 - 27 mg/dL 22 17 18   Creatinine 0.57 - 1.00 mg/dL 7.06 2.37  Sodium 134 - 144 mmol/L 145(H) 145(H) 141  Potassium 3.5 - 5.2 mmol/L 4.4 4.1 4.7  Chloride 96 - 106 mmol/L 104 107(H) 102  CO2 20 - 29 mmol/L 24 25 26   Calcium 8.7 - 10.3 mg/dL 9.9 9.7 9.7  Total Protein 6.0 - 8.5 g/dL 6.9 6.4 6.6  Total Bilirubin 0.0 - 1.2 mg/dL 0.6 1.1 0.8  Alkaline Phos 44 - 121 IU/L 78 68 79  AST 0 - 40 IU/L 19 17 17   ALT 0 - 32 IU/L 14 14 20     Lab Results  Component Value Date   WBC 6.0 05/25/2017   HGB 13.6 05/25/2017   HCT 40.7 05/25/2017   MCV 91 05/25/2017   NEUTROABS 3.3 05/25/2017    ASSESSMENT & PLAN:  Ductal carcinoma in situ (DCIS) of left breast 11/21/2018: Screening detected left breast mammogram revealing calcifications: Biopsy revealed low-grade DCIS ER 100%, PR 100% Tis NX stage 0   Treatment plan: Comet clinical trial participation (active  monitoring arm)   Breast cancer surveillance: Mammogram at [facility] 10/20/2020: Stable appearance of 0.3 cm group of amorphous calcifications in the left breast. No clinical or radiological evidence of progression of disease   Current treatment: Patient decided not to take tamoxifen therapy. Menopausal symptoms: Occasional hot flashes, muscle stiffness, trigger finger  Return to clinic in 6 months for follow-up with another mammogram of the left breast.    No orders of the defined types were placed in this encounter.  The patient has a good understanding of the overall plan. she agrees with it. she will call with any problems that may develop before the next  visit here.  Total time spent: 20 mins including face to face time and time spent for planning, charting and coordination of care  Sabas Sous, MD, MPH 12/25/2020  I, Alda Ponder, am acting as scribe for Dr. Serena Croissant.  I have reviewed the above documentation for accuracy and completeness, and I agree with the above.

## 2020-12-24 NOTE — Telephone Encounter (Signed)
AFT - 25: COMPARING AN OPERATION TO MONITORING, WITH OR WITHOUT ENDOCRINE THERAPY (COMET) FOR LOW RISK DCIS: A PHASE III PROSPECTIVE RANDOMIZED TRIAL  Outgoing call: Confirmed tomorrow's lab and MD appointment with patient. Patient denies questions at this time.   Rexene Edison, RN, BSN, CCRC Clinical Research Nurse Lead 12/24/2020 4:36 PM

## 2020-12-25 ENCOUNTER — Encounter: Payer: Self-pay | Admitting: Medical Oncology

## 2020-12-25 ENCOUNTER — Inpatient Hospital Stay: Payer: BC Managed Care – PPO | Attending: Hematology and Oncology

## 2020-12-25 ENCOUNTER — Inpatient Hospital Stay (HOSPITAL_BASED_OUTPATIENT_CLINIC_OR_DEPARTMENT_OTHER): Payer: BC Managed Care – PPO | Admitting: Hematology and Oncology

## 2020-12-25 ENCOUNTER — Other Ambulatory Visit: Payer: Self-pay

## 2020-12-25 ENCOUNTER — Other Ambulatory Visit: Payer: BC Managed Care – PPO

## 2020-12-25 ENCOUNTER — Ambulatory Visit: Payer: BC Managed Care – PPO | Admitting: Hematology and Oncology

## 2020-12-25 DIAGNOSIS — Z006 Encounter for examination for normal comparison and control in clinical research program: Secondary | ICD-10-CM

## 2020-12-25 DIAGNOSIS — Z170 Estrogen receptor positive status [ER+]: Secondary | ICD-10-CM | POA: Insufficient documentation

## 2020-12-25 DIAGNOSIS — M6530 Trigger finger, unspecified finger: Secondary | ICD-10-CM | POA: Diagnosis not present

## 2020-12-25 DIAGNOSIS — D0512 Intraductal carcinoma in situ of left breast: Secondary | ICD-10-CM

## 2020-12-25 DIAGNOSIS — R232 Flushing: Secondary | ICD-10-CM | POA: Diagnosis not present

## 2020-12-25 DIAGNOSIS — N951 Menopausal and female climacteric states: Secondary | ICD-10-CM

## 2020-12-25 LAB — RESEARCH LABS

## 2020-12-25 NOTE — Progress Notes (Signed)
AFT - 25: COMPARING AN OPERATION TO MONITORING, WITH OR WITHOUT ENDOCRINE THERAPY (COMET) FOR LOW RISK DCIS: A PHASE III PROSPECTIVE RANDOMIZED TRIAL    COMET: Active Surveillance:  49-monthfollow-up I met with Lindsey [name], here alone, in exam room for her 24 months on study follow-up. Lindsey [name] states she is doing well and denies having any issues or concerns. Lindsey [name] had her bilateral mammogram 10/20/2020 and is here for MD review and study assessments.Lindsey [name] had mammogram outside of study window due to Lindsey [name] noticing a small lump and wished to have it assessed sooner (see MD note). Lindsey [name] continues with decision to not be on any endocrine therapy (ET) since diagnosis.   H&P: MD reviewed results of bilateral mammogram and in office breast exam completed today. Labs: Research labs collected per protocol.  Questionnaire: Lindsey [name] informed that she should be receiving study questionnaires soon and to keep an eye out for an email from study. Lindsey [name] gave her verbal understanding to this.  Study Solicited Adverse Events: No hospital admissions. Lindsey [name] is not on endocrine therapy and has not had surgery related to breast cancer, has had biopsy. There are no study related AE's at this time.  Plan: Lindsey [name] informed that next study visit will be in approximately six months time, after having a unilateral mammogram, to be seen in clinic. Lindsey [name] thanked for her continued support of study and was encouraged to call with any questions or concerns she may have in the meantime.   MMaxwell Marion RN, BSN, CJohnson Clinical Research Nurse Lead 12/25/2020 3:00 PM  JUNA [id number]  Adverse Event Log  Study/Protocol: COMET Cycle: Baseline - 227-month Event Grade Onset Date Resolved Date Drug Name Attribution Treatment Comments  Hot flashes 1 Baseline resolved N/A History of None   Hypertension 2 Baseline 07/04/19 N/A History of None   Pain- bilateral knees 1 Baseline ongoing N/A History of  Conmed   alopecia 1 Baseline ongoing N/A History of  None    multiple stiff joints- decreased range of motion 1 Baseline ongoing N/A History of None   Musculoskeletal and connective tissue: other:  Trigger fingers 1 ~ 07/2020 ongoing N/A  None   Hypertension 1 12/12/2019 ongoing N/A H/O baseline Gr 2 None   Hot flashes 1 ~ 07/2019 Ongoing  N/A H/O None Per pt. 1-2 small hot flashes in a day  Left breast soreness 1 ~ 09/2020 ongoing N/A Biopsy None

## 2020-12-25 NOTE — Assessment & Plan Note (Signed)
11/21/2018:Screening detected left breast mammogram revealing calcifications: Biopsy revealed low-grade DCIS ER 100%, PR 100%Tis NX stage 0  Treatment plan: Comet clinical trial participation(active monitoring arm)  Breast cancer surveillance: Mammogram at [facility]8/03/2020:Stable appearance of 0.3 cm group of amorphous calcifications in the left breast. No clinical or radiological evidence of progression of disease  Current treatment: Patient decided not to take tamoxifen therapy. Stye on the left eyelid Return to clinic in 6 months for follow-up

## 2020-12-29 ENCOUNTER — Encounter (INDEPENDENT_AMBULATORY_CARE_PROVIDER_SITE_OTHER): Payer: Self-pay | Admitting: Family Medicine

## 2020-12-29 ENCOUNTER — Other Ambulatory Visit: Payer: Self-pay

## 2020-12-29 ENCOUNTER — Ambulatory Visit (INDEPENDENT_AMBULATORY_CARE_PROVIDER_SITE_OTHER): Payer: BC Managed Care – PPO | Admitting: Family Medicine

## 2020-12-29 DIAGNOSIS — Z6841 Body Mass Index (BMI) 40.0 and over, adult: Secondary | ICD-10-CM

## 2020-12-29 DIAGNOSIS — F3289 Other specified depressive episodes: Secondary | ICD-10-CM

## 2020-12-29 MED ORDER — BUPROPION HCL ER (SR) 200 MG PO TB12: ORAL_TABLET | ORAL | 0 refills | Status: DC

## 2020-12-30 NOTE — Progress Notes (Signed)
Chief Complaint:   OBESITY Lindsey [name] is here to discuss her progress with her obesity treatment plan along with follow-up of her obesity related diagnoses. Lindsey [name] is on keeping a food journal and adhering to recommended goals of 1800 calories and 120 grams of protein daily and states she is following her eating plan approximately 65% of the time. Lindsey [name] states she is walking for 40 minutes 7 times per week, weight lifting 2-3 times per week.  Today's visit was #: 35 Starting weight: 325 lbs Starting date: 06/04/2017 Today's weight: 178 lbs Today's date: 12/29/2020 Total lbs lost to date: 147 Total lbs lost since last in-office visit: 0  Interim History: Lindsey [name] has struggled more with weight loss in the last month. She is struggling more with late night snacking and cravings.  Subjective:   1. Other depression with emotional eating Lindsey [name] notes some increase in PM snacking and comfort eating, especially with salty carbohydrates. She has cravings and goes shopping and often has tempting foods, which she struggles to control. She has talked with Dr. Dewaine Conger in the past, but she feels she has gotten the most benefit she can from Dr. Dewaine Conger.  Assessment/Plan:   1. Other depression with emotional eating [name] was offered encouragement and support. She is very self-aware and mindful. She will continue to work on strategies to decrease emotional eating behaviors, and we will increase her visit frequency to help with accountability. We will refill Wellbutrin SR for 1 month. Orders and follow up as documented in patient record.   - buPROPion (WELLBUTRIN SR) 200 MG 12 hr tablet; TAKE 1 TABLET(200 MG) BY MOUTH DAILY  Dispense: 30 tablet; Refill: 0  2. Obesity with current BMI 28.8 Lindsey [name] is currently in the action stage of change. As such, her goal is to continue with weight loss efforts. She has agreed to keeping a food journal and adhering to recommended goals of 1500-1800 calories and 120+ grams of protein  daily.   Exercise goals: As is.  Behavioral modification strategies: increasing lean protein intake.  Lindsey [name] has agreed to follow-up with our clinic in 2 weeks. She was informed of the importance of frequent follow-up visits to maximize her success with intensive lifestyle modifications for her multiple health conditions.   Objective:   Blood pressure 131/76, pulse 73, temperature 98.1 F (36.7 C), height 5\' 6"  (1.676 m), weight 178 lb (80.7 kg), SpO2 97 %. Body mass index is 28.73 kg/m.  General: Cooperative, alert, well developed, in no acute distress. HEENT: Conjunctivae and lids unremarkable. Cardiovascular: Regular rhythm.  Lungs: Normal work of breathing. Neurologic: No focal deficits.   Lab Results  Component Value Date   CREATININE 0.65 08/20/2020   BUN 22 08/20/2020   NA 145 (H) 08/20/2020   K 4.4 08/20/2020   CL 104 08/20/2020   CO2 24 08/20/2020   Lab Results  Component Value Date   ALT 14 08/20/2020   AST 19 08/20/2020   ALKPHOS 78 08/20/2020   BILITOT 0.6 08/20/2020   Lab Results  Component Value Date   HGBA1C 5.2 08/20/2020   HGBA1C 5.2 03/26/2019   HGBA1C 5.4 03/09/2018   HGBA1C 5.5 10/12/2017   HGBA1C 5.7 (H) 05/25/2017   Lab Results  Component Value Date   INSULIN 7.3 08/20/2020   INSULIN 4.4 03/26/2019   INSULIN 10.0 03/09/2018   INSULIN 19.1 10/12/2017   INSULIN 44.2 (H) 05/25/2017   Lab Results  Component Value Date   TSH 2.88 09/19/2020   Lab Results  Component Value Date   CHOL 178 09/24/2020   HDL 76 (A) 09/24/2020   LDLCALC 92 09/24/2020   TRIG 50 09/19/2020   Lab Results  Component Value Date   VD25OH 69.5 08/20/2020   VD25OH 64.7 06/26/2019   VD25OH 56.6 03/26/2019   Lab Results  Component Value Date   WBC 6.0 05/25/2017   HGB 13.6 05/25/2017   HCT 40.7 05/25/2017   MCV 91 05/25/2017   Lab Results  Component Value Date   FERRITIN 102.0 09/19/2020   Attestation Statements:   Reviewed by clinician on day of  visit: allergies, medications, problem list, medical history, surgical history, family history, social history, and previous encounter notes.  Time spent on visit including pre-visit chart review and post-visit care and charting was 40 minutes.    I, Burt Knack, am acting as transcriptionist for Quillian Quince, MD.  I have reviewed the above documentation for accuracy and completeness, and I agree with the above. -  Quillian Quince, MD

## 2021-01-14 ENCOUNTER — Ambulatory Visit (INDEPENDENT_AMBULATORY_CARE_PROVIDER_SITE_OTHER): Payer: BC Managed Care – PPO | Admitting: Family Medicine

## 2021-01-16 ENCOUNTER — Other Ambulatory Visit: Payer: Self-pay

## 2021-01-16 ENCOUNTER — Ambulatory Visit (INDEPENDENT_AMBULATORY_CARE_PROVIDER_SITE_OTHER): Payer: Self-pay | Admitting: Plastic Surgery

## 2021-01-16 ENCOUNTER — Encounter: Payer: Self-pay | Admitting: Plastic Surgery

## 2021-01-16 DIAGNOSIS — M793 Panniculitis, unspecified: Secondary | ICD-10-CM

## 2021-01-16 DIAGNOSIS — Z719 Counseling, unspecified: Secondary | ICD-10-CM

## 2021-01-16 NOTE — Progress Notes (Signed)
Patient ID: Lindsey [name], female    DOB: [birth year], 65 y.o.   MRN: [id number]   Chief Complaint  Patient presents with   Advice Only    The patient is a 65 year old female joining me for a consultation for panniculitis.  The patient is 5 feet 6 inches tall and weighs 181 pounds.  She has worked very diligently to decrease her weight from 325 pounds over the past several years.  She does not have any ongoing medical conditions.  Her last hemoglobin A1c was 5.2 in June 2022 she complains of rashes and skin breakdown between her abdominal folds.  She does not have diabetes and she is not on a blood thinner.  She often has to treat the skin rashes and breakdown with deodorant, powder and creams.  She is a former smoker but has not smoked in the last several years.  She has a good strong abdominal wall with no sign of a hernia or rectus diastases.   Review of Systems  Constitutional:  Negative for activity change and appetite change.  Eyes: Negative.   Respiratory: Negative.  Negative for shortness of breath.   Cardiovascular:  Positive for leg swelling.  Gastrointestinal: Negative.  Negative for abdominal pain.  Endocrine: Negative.   Genitourinary: Negative.   Musculoskeletal: Negative.   Skin:  Positive for color change and rash. Negative for pallor.  Hematological: Negative.   Psychiatric/Behavioral: Negative.     Past Medical History:  Diagnosis Date   Dry skin    H/O seasonal allergies    Hair loss    Knee pain    Multiple stiff joints    Nasal congestion    Skin tag    Snoring    Urgency of urination     Past Surgical History:  Procedure Laterality Date   FOOT SURGERY     TONSILLECTOMY        Current Outpatient Medications:    BIOTIN PO, Take 1 each by mouth daily., Disp: , Rfl:    buPROPion (WELLBUTRIN SR) 200 MG 12 hr tablet, TAKE 1 TABLET(200 MG) BY MOUTH DAILY, Disp: 30 tablet, Rfl: 0   Cholecalciferol (VITAMIN D3) 125 MCG (5000 UT) CAPS, Take 1 capsule by  mouth daily., Disp: , Rfl:    fluticasone (FLONASE) 50 MCG/ACT nasal spray, Place 1 spray into both nostrils daily., Disp: , Rfl: 2   Multiple Vitamins-Minerals (WOMENS MULTIVITAMIN PLUS PO), Take 1 tablet by mouth daily., Disp: , Rfl:    Turmeric 500 MG CAPS, Take 1 capsule by mouth daily., Disp: , Rfl:    Objective:   Vitals:   01/16/21 1233  BP: 131/76  Pulse: 73  Resp: 16  SpO2: 100%    Physical Exam Vitals and nursing note reviewed.  Constitutional:      Appearance: Normal appearance.  HENT:     Head: Normocephalic and atraumatic.  Cardiovascular:     Rate and Rhythm: Normal rate.     Pulses: Normal pulses.  Pulmonary:     Effort: Pulmonary effort is normal. No respiratory distress.  Abdominal:     Palpations: Abdomen is soft.  Musculoskeletal:        General: No swelling or deformity.  Skin:    General: Skin is warm.     Capillary Refill: Capillary refill takes less than 2 seconds.     Coloration: Skin is not jaundiced.     Findings: No bruising.  Neurological:     Mental Status: She is alert  and oriented to person, place, and time.  Psychiatric:        Mood and Affect: Mood normal.        Behavior: Behavior normal.        Thought Content: Thought content normal.        Judgment: Judgment normal.    Assessment & Plan:  Panniculitis  The patient is a good candidate for panniculectomy or abdominoplasty.  She has done an amazing job with reducing her weight and keeping it off.  She would like to see if she can submit this to Medicare when she joins in January.  She knows to give [place] a call after she is joined and we can get it submitted at that time.  Pictures were obtained of the patient and placed in the chart with the patient's or guardian's permission.   Alena Bills Lindsey Mehrer, DO

## 2021-01-16 NOTE — Progress Notes (Signed)
Botulinum Toxin Procedure Note  Procedure: Cosmetic botulinum toxin Pre-operative Diagnosis: Dynamic rhytides  Post-operative Diagnosis: Same  Complications:  None  Brief history: The patient desires botulinum toxin injection of her forehead. I discussed with the patient this proposed procedure of botulinum toxin injections, which is customized depending on the particular needs of the patient. It is performed on facial rhytids as a temporary correction. The alternatives were discussed with the patient. The risks were addressed including bleeding, scarring, infection, damage to deeper structures, asymmetry, and chronic pain, which may occur infrequently after a procedure. The individual's choice to undergo a surgical procedure is based on the comparison of risks to potential benefits. Other risks include unsatisfactory results, brow ptosis, eyelid ptosis, allergic reaction, temporary paralysis, which should go away with time, bruising, blurring disturbances and delayed healing. Botulinum toxin injections do not arrest the aging process or produce permanent tightening of the eyelid.  Operative intervention maybe necessary to maintain the results of a blepharoplasty or botulinum toxin. The patient understands and wishes to proceed.  Procedure: The area was prepped with alcohol and dried with a clean gauze. Using a clean technique, the botulinum toxin was diluted with 1.25 cc of preservative-free normal saline which was slowly injected with an 18 gauge needle in a tuberculin syringes.  A 32 gauge needles were then used to inject the botulinum toxin. This mixture allow for an aliquot of 4 units per 0.1 cc in each injection site.    Subsequently the mixture was injected in the glabellar and forehead area with preservation of the temporal branch to the lateral eyebrow as well as into each lateral canthal area beginning from the lateral orbital rim medial to the zygomaticus major in 3 separate areas. A total of  30 Units of botulinum toxin was used. The forehead and glabellar area was injected with care to inject intramuscular only while holding pressure on the supratrochlear vessels in each area during each injection on either side of the medial corrugators. The injection proceeded vertically superiorly to the medial 2/3 of the frontalis muscle and superior 2/3 of the lateral frontalis, again with preservation of the frontal branch.  No complications were noted. Light pressure was held for 5 minutes. She was instructed explicitly in post-operative care.  Botox LOT:  [id number] EXP:  7/24 

## 2021-01-16 NOTE — Progress Notes (Signed)
   Subjective:    Patient ID: Lindsey [name], female    DOB: 09-Nov-1955, 65 y.o.   MRN: [id number]  The patient is a 65 year old female here for evaluation of her eyes.  She does not like the bags underneath her eyes.  She has a mild dermatochalasis of her upper lids and laxity of the lower fat pads.  Her skin is in good condition without a severe excess.  I think we would need to be really careful with not removing too much skin but mostly focusing on the fat to prevent exacerbating any itching or dry eye syndrome.  She also requested some information about Botox.   Review of Systems  Constitutional: Negative.   HENT: Negative.    Eyes: Negative.   Respiratory: Negative.    Cardiovascular: Negative.   Gastrointestinal: Negative.   Endocrine: Negative.   Genitourinary: Negative.   Musculoskeletal: Negative.   Skin: Negative.       Objective:   Physical Exam Vitals and nursing note reviewed.  Constitutional:      Appearance: Normal appearance.  HENT:     Head: Normocephalic and atraumatic.  Cardiovascular:     Rate and Rhythm: Normal rate.     Pulses: Normal pulses.  Pulmonary:     Effort: Pulmonary effort is normal.  Abdominal:     General: There is no distension.     Palpations: Abdomen is soft. There is no mass.  Skin:    General: Skin is warm.     Coloration: Skin is not jaundiced.  Neurological:     Mental Status: She is alert and oriented to person, place, and time.  Psychiatric:        Mood and Affect: Mood normal.        Behavior: Behavior normal.        Thought Content: Thought content normal.      Assessment & Plan:     ICD-10-CM   1. Encounter for counseling  Z71.9       Pictures were obtained of the patient and placed in the chart with the patient's or guardian's permission.  The patient is a good candidate for upper and lower lid blepharoplasty.  We will provide her with a quote.

## 2021-01-28 ENCOUNTER — Other Ambulatory Visit (INDEPENDENT_AMBULATORY_CARE_PROVIDER_SITE_OTHER): Payer: Self-pay | Admitting: Family Medicine

## 2021-01-28 DIAGNOSIS — F3289 Other specified depressive episodes: Secondary | ICD-10-CM

## 2021-01-28 NOTE — Telephone Encounter (Signed)
LAST APPOINTMENT DATE: 12/29/20 NEXT APPOINTMENT DATE: 02/03/21   [id number] [place], [place] - [address] AT [facility] [address] [place] [address] Phone: [phone] Fax: [phone]  [facility] [address] [place], [place] - [address] [place] [address] Phone: [phone] Fax: [phone]  Patient is requesting a refill of the following medications: Requested Prescriptions   Pending Prescriptions Disp Refills   buPROPion (WELLBUTRIN SR) 200 MG 12 hr tablet [Pharmacy Med Name: BUPROPION SR 200MG  TABLETS (12HR)] 30 tablet 0    Sig: TAKE 1 TABLET(200 MG) BY MOUTH DAILY    Date last filled: 12/29/20 Previously prescribed by Dr. 02/28/21  Lab Results  Component Value Date   HGBA1C 5.2 08/20/2020   HGBA1C 5.2 03/26/2019   HGBA1C 5.4 03/09/2018   Lab Results  Component Value Date   LDLCALC 92 09/24/2020   CREATININE 0.65 08/20/2020   Lab Results  Component Value Date   VD25OH 69.5 08/20/2020   VD25OH 64.7 06/26/2019   VD25OH 56.6 03/26/2019    BP Readings from Last 3 Encounters:  01/16/21 131/76  12/29/20 131/76  12/25/20 130/70

## 2021-02-03 ENCOUNTER — Other Ambulatory Visit: Payer: Self-pay

## 2021-02-03 ENCOUNTER — Encounter (INDEPENDENT_AMBULATORY_CARE_PROVIDER_SITE_OTHER): Payer: Self-pay | Admitting: Family Medicine

## 2021-02-03 ENCOUNTER — Ambulatory Visit (INDEPENDENT_AMBULATORY_CARE_PROVIDER_SITE_OTHER): Payer: BC Managed Care – PPO | Admitting: Family Medicine

## 2021-02-03 VITALS — BP 117/72 | HR 66 | Temp 98.1°F | Ht 66.0 in | Wt 175.0 lb

## 2021-02-03 DIAGNOSIS — Z6841 Body Mass Index (BMI) 40.0 and over, adult: Secondary | ICD-10-CM

## 2021-02-03 DIAGNOSIS — F439 Reaction to severe stress, unspecified: Secondary | ICD-10-CM | POA: Diagnosis not present

## 2021-02-03 NOTE — Progress Notes (Signed)
Chief Complaint:   OBESITY Lindsey [name] is here to discuss her progress with her obesity treatment plan along with follow-up of her obesity related diagnoses. Lindsey [name] is on keeping a food journal and adhering to recommended goals of 1500-1800 calories and 120+ grams of protein daily and states she is following her eating plan approximately 30% of the time. Lindsey [name] states she is walking for 40 minutes 5 times per week.  Today's visit was #: 36 Starting weight: 325 lbs Starting date: 06/04/2017 Today's weight: 175 lbs Today's date: 02/03/2021 Total lbs lost to date: 150 Total lbs lost since last in-office visit: 3  Interim History: Lindsey [name] has been on vacation and she hasn't been as strict on her eating plan. She has has a lot  of stress, but she is trying to make smarter choices. She is mindful of her protein but she may not be meeting her goals.  Subjective:   1. Stress Lindsey [name] has been dealing with increased stress. She is dealing with changes at work and with changing to [employer].  Assessment/Plan:   1. Stress Lindsey [name] was offered support and encouragement. She will continue to monitor and will continue Wellbutrin.   2. Obesity BMI today is 64 Lindsey [name] is currently in the action stage of change. As such, her goal is to continue with weight loss efforts. She has agreed to keeping a food journal and adhering to recommended goals of 1800 calories and 120 grams of protein daily.   Exercise goals: As is.  Behavioral modification strategies: increasing lean protein intake and meal planning and cooking strategies.  Lindsey [name] has agreed to follow-up with our clinic in 4 weeks. She was informed of the importance of frequent follow-up visits to maximize her success with intensive lifestyle modifications for her multiple health conditions.   Objective:   Blood pressure 117/72, pulse 66, temperature 98.1 F (36.7 C), height 5\' 6"  (1.676 m), weight 175 lb (79.4 kg), SpO2 99 %. Body mass index is 28.25  kg/m.  General: Cooperative, alert, well developed, in no acute distress. HEENT: Conjunctivae and lids unremarkable. Cardiovascular: Regular rhythm.  Lungs: Normal work of breathing. Neurologic: No focal deficits.   Lab Results  Component Value Date   CREATININE 0.65 08/20/2020   BUN 22 08/20/2020   NA 145 (H) 08/20/2020   K 4.4 08/20/2020   CL 104 08/20/2020   CO2 24 08/20/2020   Lab Results  Component Value Date   ALT 14 08/20/2020   AST 19 08/20/2020   ALKPHOS 78 08/20/2020   BILITOT 0.6 08/20/2020   Lab Results  Component Value Date   HGBA1C 5.2 08/20/2020   HGBA1C 5.2 03/26/2019   HGBA1C 5.4 03/09/2018   HGBA1C 5.5 10/12/2017   HGBA1C 5.7 (H) 05/25/2017   Lab Results  Component Value Date   INSULIN 7.3 08/20/2020   INSULIN 4.4 03/26/2019   INSULIN 10.0 03/09/2018   INSULIN 19.1 10/12/2017   INSULIN 44.2 (H) 05/25/2017   Lab Results  Component Value Date   TSH 2.88 09/19/2020   Lab Results  Component Value Date   CHOL 178 09/24/2020   HDL 76 (A) 09/24/2020   LDLCALC 92 09/24/2020   TRIG 50 09/19/2020   Lab Results  Component Value Date   VD25OH 69.5 08/20/2020   VD25OH 64.7 06/26/2019   VD25OH 56.6 03/26/2019   Lab Results  Component Value Date   WBC 6.0 05/25/2017   HGB 13.6 05/25/2017   HCT 40.7 05/25/2017   MCV 91 05/25/2017  Lab Results  Component Value Date   FERRITIN 102.0 09/19/2020   Attestation Statements:   Reviewed by clinician on day of visit: allergies, medications, problem list, medical history, surgical history, family history, social history, and previous encounter notes.  Time spent on visit including pre-visit chart review and post-visit care and charting was 35 minutes.    I, Burt Knack, am acting as transcriptionist for Quillian Quince, MD.  I have reviewed the above documentation for accuracy and completeness, and I agree with the above. -  Quillian Quince, MD

## 2021-02-27 ENCOUNTER — Telehealth: Payer: Self-pay | Admitting: Emergency Medicine

## 2021-02-27 NOTE — Telephone Encounter (Signed)
AFT - 25: COMPARING AN OPERATION TO MONITORING, WITH OR WITHOUT ENDOCRINE THERAPY (COMET) FOR LOW RISK DCIS: A PHASE III PROSPECTIVE RANDOMIZED TRIAL  Pt contacted Research requesting study ID # for questionnaire completion.  Provided her with ID ([id number]).  Also introduced self as her new Clinical Research Nurse and provided her with my contact info.  Pt aware to call back as needed with any questions or concerns before her next appt.  Lurena Joiner 'Warden FillersDairl Ponder, RN, BSN Clinical Research Nurse I 02/27/21 3:58 PM

## 2021-03-04 ENCOUNTER — Other Ambulatory Visit: Payer: Self-pay

## 2021-03-04 ENCOUNTER — Encounter (INDEPENDENT_AMBULATORY_CARE_PROVIDER_SITE_OTHER): Payer: Self-pay | Admitting: Family Medicine

## 2021-03-04 ENCOUNTER — Ambulatory Visit (INDEPENDENT_AMBULATORY_CARE_PROVIDER_SITE_OTHER): Payer: BC Managed Care – PPO | Admitting: Family Medicine

## 2021-03-04 VITALS — BP 130/71 | HR 70 | Temp 98.5°F | Ht 66.0 in | Wt 174.0 lb

## 2021-03-04 DIAGNOSIS — E559 Vitamin D deficiency, unspecified: Secondary | ICD-10-CM

## 2021-03-04 DIAGNOSIS — F3289 Other specified depressive episodes: Secondary | ICD-10-CM | POA: Diagnosis not present

## 2021-03-04 DIAGNOSIS — Z6841 Body Mass Index (BMI) 40.0 and over, adult: Secondary | ICD-10-CM

## 2021-03-04 MED ORDER — BUPROPION HCL ER (SR) 200 MG PO TB12: ORAL_TABLET | ORAL | 0 refills | Status: DC

## 2021-03-04 NOTE — Progress Notes (Signed)
Chief Complaint:   OBESITY Lindsey [name] is here to discuss her progress with her obesity treatment plan along with follow-up of her obesity related diagnoses. Lindsey [name] is on keeping a food journal and adhering to recommended goals of 1800 calories and 120 grams of protein daily and states she is following her eating plan approximately 60% of the time. Lindsey [name] states she is walking for 30-40 minutes 6 times per week.  Today's visit was #: 37 Starting weight: 325 lbs Starting date: 05/25/2017 Today's weight: 174 lbs Today's date: 03/04/2021 Total lbs lost to date: 151 Total lbs lost since last in-office visit: 1  Interim History: Lindsey [name] continues to do well with avoiding holiday weight gain. She has had some increase in calories, but she has been mindful. She has increased stress with work, but she is still trying to increase her protein.  Subjective:   1. Vitamin D deficiency Lindsey [name] is stable on Vit D with no side effects noted.  2. Other depression with emotional eating Lindsey [name] continues to work on [employer] She does not want to know her weight as she tends to get obsessive about the numbers. Her mood overall is stable.  Assessment/Plan:   1. Vitamin D deficiency Lindsey [name] will continue OTC Vitamin D 5,000 IU daily, and we will recheck labs in 1-2 months. She will follow-up for routine testing of Vitamin D, at least 2-3 times per year to avoid over-replacement.  2. Other depression with emotional eating We will refill Wellbutrin SR for 1 month. Behavior modification techniques were discussed today to help Lindsey [name] deal with her emotional/non-hunger eating behaviors. Orders and follow up as documented in patient record.   - buPROPion (WELLBUTRIN SR) 200 MG 12 hr tablet; Take one a day.  Dispense: 30 tablet; Refill: 0  3. Obesity BMI today is 28.1 Lindsey [name] is currently in the action stage of change. As such, her goal is to continue with weight loss efforts. She has agreed to keeping a food  journal and adhering to recommended goals of 1500-1800 calories and 120 grams of protein daily.   Exercise goals: As is.  Behavioral modification strategies: increasing lean protein intake and holiday eating strategies .  Lindsey [name] has agreed to follow-up with our clinic in 4 weeks. She was informed of the importance of frequent follow-up visits to maximize her success with intensive lifestyle modifications for her multiple health conditions.   Objective:   Blood pressure 130/71, pulse 70, temperature 98.5 F (36.9 C), height 5\' 6"  (1.676 m), weight 174 lb (78.9 kg), SpO2 100 %. Body mass index is 28.08 kg/m.  General: Cooperative, alert, well developed, in no acute distress. HEENT: Conjunctivae and lids unremarkable. Cardiovascular: Regular rhythm.  Lungs: Normal work of breathing. Neurologic: No focal deficits.   Lab Results  Component Value Date   CREATININE 0.65 08/20/2020   BUN 22 08/20/2020   NA 145 (H) 08/20/2020   K 4.4 08/20/2020   CL 104 08/20/2020   CO2 24 08/20/2020   Lab Results  Component Value Date   ALT 14 08/20/2020   AST 19 08/20/2020   ALKPHOS 78 08/20/2020   BILITOT 0.6 08/20/2020   Lab Results  Component Value Date   HGBA1C 5.2 08/20/2020   HGBA1C 5.2 03/26/2019   HGBA1C 5.4 03/09/2018   HGBA1C 5.5 10/12/2017   HGBA1C 5.7 (H) 05/25/2017   Lab Results  Component Value Date   INSULIN 7.3 08/20/2020   INSULIN 4.4 03/26/2019   INSULIN 10.0 03/09/2018   INSULIN  19.1 10/12/2017   INSULIN 44.2 (H) 05/25/2017   Lab Results  Component Value Date   TSH 2.88 09/19/2020   Lab Results  Component Value Date   CHOL 178 09/24/2020   HDL 76 (A) 09/24/2020   LDLCALC 92 09/24/2020   TRIG 50 09/19/2020   Lab Results  Component Value Date   VD25OH 69.5 08/20/2020   VD25OH 64.7 06/26/2019   VD25OH 56.6 03/26/2019   Lab Results  Component Value Date   WBC 6.0 05/25/2017   HGB 13.6 05/25/2017   HCT 40.7 05/25/2017   MCV 91 05/25/2017   Lab Results   Component Value Date   FERRITIN 102.0 09/19/2020   Attestation Statements:   Reviewed by clinician on day of visit: allergies, medications, problem list, medical history, surgical history, family history, social history, and previous encounter notes.   I, Burt Knack, am acting as transcriptionist for Quillian Quince, MD.  I have reviewed the above documentation for accuracy and completeness, and I agree with the above. -  Quillian Quince, MD

## 2021-03-31 ENCOUNTER — Telehealth: Payer: Self-pay | Admitting: Plastic Surgery

## 2021-03-31 NOTE — Telephone Encounter (Signed)
LVM for patient regarding new insurance. Per note with Dr. Ulice Bold, patient would be getting new insurance in January and would provide insurance card/id. Calling to follow up in that insurance so authorization can be submitted if patient is still interested.

## 2021-04-07 ENCOUNTER — Other Ambulatory Visit: Payer: Self-pay

## 2021-04-07 ENCOUNTER — Encounter (INDEPENDENT_AMBULATORY_CARE_PROVIDER_SITE_OTHER): Payer: Self-pay | Admitting: Family Medicine

## 2021-04-07 ENCOUNTER — Ambulatory Visit (INDEPENDENT_AMBULATORY_CARE_PROVIDER_SITE_OTHER): Payer: Medicare PPO | Admitting: Family Medicine

## 2021-04-07 VITALS — BP 107/68 | HR 77 | Temp 98.6°F | Ht 66.0 in | Wt 178.0 lb

## 2021-04-07 DIAGNOSIS — E669 Obesity, unspecified: Secondary | ICD-10-CM

## 2021-04-07 DIAGNOSIS — F3289 Other specified depressive episodes: Secondary | ICD-10-CM | POA: Diagnosis not present

## 2021-04-07 DIAGNOSIS — Z6828 Body mass index (BMI) 28.0-28.9, adult: Secondary | ICD-10-CM | POA: Diagnosis not present

## 2021-04-07 DIAGNOSIS — Z6841 Body Mass Index (BMI) 40.0 and over, adult: Secondary | ICD-10-CM

## 2021-04-07 NOTE — Progress Notes (Signed)
Chief Complaint:   OBESITY Lindsey [name] is here to discuss her progress with her obesity treatment plan along with follow-up of her obesity related diagnoses. Lindsey [name] is on keeping a food journal and adhering to recommended goals of 1400 calories and 75+ grams of protein daily and states she is following her eating plan approximately 15% of the time. Lindsey [name] states she is walking for 30 minutes 6 times per week.  Today's visit was #: 34 Starting weight: 325 lbs Starting date: 05/25/2017 Today's weight: 178 lbs Today's date: 04/07/2021 Total lbs lost to date: 147 Total lbs lost since last in-office visit: 0  Interim History: Lindsey [name] did some celebration eating over the holidays. She finds it difficult to get back to a structured plan. Her protein levels have decreased.  Subjective:   1. Other depression with emotional eating Lindsey [name] is working on diet and exercise. She is stable on Wellbutrin. She notes increased emotional eating behaviors, especially since the holidays and are especially worse in the PM.  Assessment/Plan:   1. Other depression with emotional eating Emotional eating strategies were discussed, and Lindsey [name] is to change Wellbutrin to noon to see if it will help later without affecting her sleep.  2. Obesity with current BMI 28.9 Lindsey [name] is currently in the action stage of change. As such, her goal is to continue with weight loss efforts. She has agreed to keeping a food journal and adhering to recommended goals of 1500-1800 calories and 120 grams of protein daily.   Exercise goals: As is.  Behavioral modification strategies: increasing lean protein intake and meal planning and cooking strategies.  Lindsey [name] has agreed to follow-up with our clinic in 4 weeks. She was informed of the importance of frequent follow-up visits to maximize her success with intensive lifestyle modifications for her multiple health conditions.   Objective:   Blood pressure 107/68, pulse 77, temperature 98.6 F (37 C),  height 5\' 6"  (1.676 m), weight 178 lb (80.7 kg), SpO2 99 %. Body mass index is 28.73 kg/m.  General: Cooperative, alert, well developed, in no acute distress. HEENT: Conjunctivae and lids unremarkable. Cardiovascular: Regular rhythm.  Lungs: Normal work of breathing. Neurologic: No focal deficits.   Lab Results  Component Value Date   CREATININE 0.65 08/20/2020   BUN 22 08/20/2020   NA 145 (H) 08/20/2020   K 4.4 08/20/2020   CL 104 08/20/2020   CO2 24 08/20/2020   Lab Results  Component Value Date   ALT 14 08/20/2020   AST 19 08/20/2020   ALKPHOS 78 08/20/2020   BILITOT 0.6 08/20/2020   Lab Results  Component Value Date   HGBA1C 5.2 08/20/2020   HGBA1C 5.2 03/26/2019   HGBA1C 5.4 03/09/2018   HGBA1C 5.5 10/12/2017   HGBA1C 5.7 (H) 05/25/2017   Lab Results  Component Value Date   INSULIN 7.3 08/20/2020   INSULIN 4.4 03/26/2019   INSULIN 10.0 03/09/2018   INSULIN 19.1 10/12/2017   INSULIN 44.2 (H) 05/25/2017   Lab Results  Component Value Date   TSH 2.88 09/19/2020   Lab Results  Component Value Date   CHOL 178 09/24/2020   HDL 76 (A) 09/24/2020   LDLCALC 92 09/24/2020   TRIG 50 09/19/2020   Lab Results  Component Value Date   VD25OH 69.5 08/20/2020   VD25OH 64.7 06/26/2019   VD25OH 56.6 03/26/2019   Lab Results  Component Value Date   WBC 6.0 05/25/2017   HGB 13.6 05/25/2017   HCT 40.7 05/25/2017  MCV 91 05/25/2017   Lab Results  Component Value Date   FERRITIN 102.0 09/19/2020   Attestation Statements:   Reviewed by clinician on day of visit: allergies, medications, problem list, medical history, surgical history, family history, social history, and previous encounter notes.  Time spent on visit including pre-visit chart review and post-visit care and charting was 38 minutes.    I, Trixie Dredge, am acting as transcriptionist for Dennard Nip, MD.  I have reviewed the above documentation for accuracy and completeness, and I agree with  the above. -  Dennard Nip, MD

## 2021-04-08 ENCOUNTER — Encounter: Payer: Self-pay | Admitting: Hematology and Oncology

## 2021-04-21 ENCOUNTER — Ambulatory Visit: Payer: Medicare PPO | Admitting: Cardiology

## 2021-04-30 ENCOUNTER — Other Ambulatory Visit: Payer: Self-pay

## 2021-05-05 ENCOUNTER — Ambulatory Visit (INDEPENDENT_AMBULATORY_CARE_PROVIDER_SITE_OTHER): Payer: Medicare PPO | Admitting: Family Medicine

## 2021-06-01 ENCOUNTER — Other Ambulatory Visit: Payer: Self-pay

## 2021-06-01 ENCOUNTER — Ambulatory Visit (INDEPENDENT_AMBULATORY_CARE_PROVIDER_SITE_OTHER): Payer: Medicare PPO | Admitting: Family Medicine

## 2021-06-01 ENCOUNTER — Encounter (INDEPENDENT_AMBULATORY_CARE_PROVIDER_SITE_OTHER): Payer: Self-pay | Admitting: Family Medicine

## 2021-06-01 VITALS — BP 111/65 | HR 60 | Temp 97.8°F | Ht 66.0 in | Wt 188.0 lb

## 2021-06-01 DIAGNOSIS — Z6830 Body mass index (BMI) 30.0-30.9, adult: Secondary | ICD-10-CM | POA: Diagnosis not present

## 2021-06-01 DIAGNOSIS — Z6841 Body Mass Index (BMI) 40.0 and over, adult: Secondary | ICD-10-CM

## 2021-06-01 DIAGNOSIS — F3289 Other specified depressive episodes: Secondary | ICD-10-CM

## 2021-06-01 DIAGNOSIS — E669 Obesity, unspecified: Secondary | ICD-10-CM | POA: Diagnosis not present

## 2021-06-01 MED ORDER — BUPROPION HCL ER (SR) 200 MG PO TB12: ORAL_TABLET | ORAL | 0 refills | Status: DC

## 2021-06-03 NOTE — Progress Notes (Signed)
? ? ? ?Chief Complaint:  ? ?OBESITY ?Lindsey [name] is here to discuss her progress with her obesity treatment plan along with follow-up of her obesity related diagnoses. Lindsey [name] is on keeping a food journal and adhering to recommended goals of 1500-1800 calories and 120 grams of protein daily and states she is following her eating plan approximately 50% of the time. Lindsey [name] states she is walking for 30-40 minutes 5-6 times per week. ? ?Today's visit was #: 39 ?Starting weight: 325 lbs ?Starting date: 05/25/2017 ?Today's weight: 188 lbs ?Today's date: 06/01/2021 ?Total lbs lost to date: 137 ?Total lbs lost since last in-office visit: 0 ? ?Interim History: Lindsey [name] is struggling physically and emotionally since Christmas, and staying on track. She reports cravings and hunger late afternoons. She is not skipping meals. She has not been eating sweets since [place] started. ? ?Subjective:  ? ?1. Other depression with emotional eating ?Lindsey [name] is taking Wellbutrin SR 200 mg. She denies side effects. She doesn't feel it is helpful.  ? ?Assessment/Plan:  ? ?1. Other depression with emotional eating ?Lindsey [name] is to take Wellbutrin SR every other day for 1 week, and then stop. She will consider Topamax for food impulse control and cravings. Side effects were discussed. ? ?2. Obesity with current BMI 30.4 ?Lindsey [name] is currently in the action stage of change. As such, her goal is to continue with weight loss efforts. She has agreed to keeping a food journal and adhering to recommended goals of 1500-1800 calories and 120+ grams of protein daily.  ? ?Exercise goals: As is. ? ?Behavioral modification strategies: increasing lean protein intake, increasing water intake, no skipping meals, and meal planning and cooking strategies. ? ?Lindsey [name] has agreed to follow-up with our clinic in 2 to 3 weeks. She was informed of the importance of frequent follow-up visits to maximize her success with intensive lifestyle modifications for her multiple health conditions.   ? ?Objective:  ? ?Blood pressure 111/65, pulse 60, temperature 97.8 ?F (36.6 ?C), height 5\' 6"  (1.676 m), weight 188 lb (85.3 kg), SpO2 98 %. ?Body mass index is 30.34 kg/m?. ? ?General: Cooperative, alert, well developed, in no acute distress. ?HEENT: Conjunctivae and lids unremarkable. ?Cardiovascular: Regular rhythm.  ?Lungs: Normal work of breathing. ?Neurologic: No focal deficits.  ? ?Lab Results  ?Component Value Date  ? CREATININE 0.65 08/20/2020  ? BUN 22 08/20/2020  ? NA 145 (H) 08/20/2020  ? K 4.4 08/20/2020  ? CL 104 08/20/2020  ? CO2 24 08/20/2020  ? ?Lab Results  ?Component Value Date  ? ALT 14 08/20/2020  ? AST 19 08/20/2020  ? ALKPHOS 78 08/20/2020  ? BILITOT 0.6 08/20/2020  ? ?Lab Results  ?Component Value Date  ? HGBA1C 5.2 08/20/2020  ? HGBA1C 5.2 03/26/2019  ? HGBA1C 5.4 03/09/2018  ? HGBA1C 5.5 10/12/2017  ? HGBA1C 5.7 (H) 05/25/2017  ? ?Lab Results  ?Component Value Date  ? INSULIN 7.3 08/20/2020  ? INSULIN 4.4 03/26/2019  ? INSULIN 10.0 03/09/2018  ? INSULIN 19.1 10/12/2017  ? INSULIN 44.2 (H) 05/25/2017  ? ?Lab Results  ?Component Value Date  ? TSH 2.88 09/19/2020  ? ?Lab Results  ?Component Value Date  ? CHOL 178 09/24/2020  ? HDL 76 (A) 09/24/2020  ? LDLCALC 92 09/24/2020  ? TRIG 50 09/19/2020  ? ?Lab Results  ?Component Value Date  ? VD25OH 69.5 08/20/2020  ? VD25OH 64.7 06/26/2019  ? VD25OH 56.6 03/26/2019  ? ?Lab Results  ?Component Value Date  ? WBC 6.0 05/25/2017  ?  HGB 13.6 05/25/2017  ? HCT 40.7 05/25/2017  ? MCV 91 05/25/2017  ? ?Lab Results  ?Component Value Date  ? FERRITIN 102.0 09/19/2020  ? ?Attestation Statements:  ? ?Reviewed by clinician on day of visit: allergies, medications, problem list, medical history, surgical history, family history, social history, and previous encounter notes. ? ?Time spent on visit including pre-visit chart review and post-visit care and charting was 36 minutes.  ? ? ?I, Burt Knack, am acting as transcriptionist for Quillian Quince, MD. ? ?I have  reviewed the above documentation for accuracy and completeness, and I agree with the above. -  Quillian Quince, MD ? ? ?

## 2021-06-04 ENCOUNTER — Telehealth: Payer: Self-pay | Admitting: Emergency Medicine

## 2021-06-04 NOTE — Telephone Encounter (Signed)
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 ?Contacted patient to see if she would be willing to reschedule upcoming appt with MD Gudena to sooner for COMET trial.  Patient agreed to see Dr. Pamelia Hoit at 10:30 on 06/18/21 instead of April 6th.  Scheduling message sent high priority to change this.  Patient states she did not have any symptoms or concerns that required an early mammmogram (last one done 04/08/21). ? ?Lurena Joiner '[employer]' Lindsey Bochicchio, RN, BSN ?Clinical Research Nurse I ?06/04/21 ?3:15 PM ? ?

## 2021-06-09 ENCOUNTER — Other Ambulatory Visit (INDEPENDENT_AMBULATORY_CARE_PROVIDER_SITE_OTHER): Payer: Self-pay | Admitting: Family Medicine

## 2021-06-09 DIAGNOSIS — F3289 Other specified depressive episodes: Secondary | ICD-10-CM

## 2021-06-09 NOTE — Telephone Encounter (Signed)
Dr.Beasley 

## 2021-06-10 ENCOUNTER — Telehealth: Payer: Self-pay | Admitting: Emergency Medicine

## 2021-06-10 NOTE — Telephone Encounter (Signed)
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 ?Contacted [facility] to have second pathologist read breast biopsy done on left breast on 04/30/2021.  Dr. Kristian Covey was only pathologist that reviewed slides per path report and protocol requires double pathologist review.  Spoke with Dr. Kenard Gower who states he will have a colleague review it and Lowella Bandy in Client Services who has Clinical Research Nurse contact information to follow up on request. ? ?Lurena Joiner '[employer]' Letizia Hook, RN, BSN ?Clinical Research Nurse I ?06/10/21 ?3:52 PM ? ?

## 2021-06-18 ENCOUNTER — Encounter: Payer: Self-pay | Admitting: Emergency Medicine

## 2021-06-18 ENCOUNTER — Ambulatory Visit: Payer: Self-pay | Admitting: Hematology and Oncology

## 2021-06-22 NOTE — Progress Notes (Signed)
? ?Patient Care Team: ?Wilder Glade, MD as PCP - General (Family Medicine) ?Rexene Edison, RN as Designer, jewellery (Medical Oncology) ? ?DIAGNOSIS:  ?Encounter Diagnosis  ?Name Primary?  ? Ductal carcinoma in situ (DCIS) of left breast   ? ? ?SUMMARY OF ONCOLOGIC HISTORY: ?Oncology History  ?Ductal carcinoma in situ (DCIS) of left breast  ?11/21/2018 Initial Diagnosis  ? Routine screening mammogram detected calcifications in the left breast. Biopsy showed low-grade DCIS, ER+ 100%, PR+ 100%.  ?  ? ? ?CHIEF COMPLIANT: Follow-up mammogram of left breast DCIS ? ?INTERVAL HISTORY: Lindsey [name] is a 66 y.o. with above-mentioned history of  left breast DCIS. She is a participant in the COMET clinical trial and was randomized to the active surveillance arm. She presents to the clinic today for follow-up.  ?She has been doing reasonably well without any problems or concerns.  She did feel a small nodule in the upper outer aspect of the left breast.  It was not causing her any symptoms. ? ? ?ALLERGIES:  is allergic to lactose intolerance (gi), sulfa antibiotics, and amoxicillin. ? ?MEDICATIONS:  ?Current Outpatient Medications  ?Medication Sig Dispense Refill  ? BIOTIN PO Take 1 each by mouth daily.    ? Cholecalciferol (VITAMIN D3) 125 MCG (5000 UT) CAPS Take 1 capsule by mouth daily.    ? fluticasone (FLONASE) 50 MCG/ACT nasal spray Place 1 spray into both nostrils daily.  2  ? Multiple Vitamins-Minerals (WOMENS MULTIVITAMIN PLUS PO) Take 1 tablet by mouth daily.    ? Turmeric 500 MG CAPS Take 1 capsule by mouth daily.    ? ?No current facility-administered medications for this visit.  ? ? ?PHYSICAL EXAMINATION: ?ECOG PERFORMANCE STATUS: 1 - Symptomatic but completely ambulatory ? ?Vitals:  ? 06/23/21 0814  ?BP: 122/60  ?Pulse: 65  ?Resp: 17  ?Temp: (!) 97.3 ?F (36.3 ?C)  ?SpO2: 100%  ? ?Filed Weights  ? 06/23/21 0814  ?Weight: 186 lb 4.8 oz (84.5 kg)  ? ? ?BREAST: The area of palpable concern did not reveal any  major abnormalities.  There is a fat glob elation and nodularity at the site of her findings.  I felt this was normal breast architecture. No palpable axillary supraclavicular or infraclavicular adenopathy no breast tenderness or nipple discharge. (exam performed in the presence of a chaperone) ? ?LABORATORY DATA:  ?I have reviewed the data as listed ? ?  Latest Ref Rng & Units 08/20/2020  ?  1:25 PM 03/26/2019  ? 11:40 AM 03/09/2018  ?  9:22 AM  ?CMP  ?Glucose 65 - 99 mg/dL 84   81   92    ?BUN 8 - 27 mg/dL 22   17   18     ?Creatinine 0.57 - 1.00 mg/dL   4.25   9.56    ?Sodium 134 - 144 mmol/L 145   145   141    ?Potassium 3.5 - 5.2 mmol/L 4.4   4.1   4.7    ?Chloride 96 - 106 mmol/L 104   107   102    ?CO2 20 - 29 mmol/L 24   25   26     ?Calcium 8.7 - 10.3 mg/dL 9.9   9.7   9.7    ?Total Protein 6.0 - 8.5 g/dL 6.9   6.4   6.6    ?Total Bilirubin 0.0 - 1.2 mg/dL 0.6   1.1   0.8    ?Alkaline Phos 44 - 121 IU/L 78  68   79    ?AST 0 - 40 IU/L 19   17   17     ?ALT 0 - 32 IU/L 14   14   20     ? ? ?Lab Results  ?Component Value Date  ? WBC 6.0 05/25/2017  ? HGB 13.6 05/25/2017  ? HCT 40.7 05/25/2017  ? MCV 91 05/25/2017  ? NEUTROABS 3.3 05/25/2017  ? ? ?ASSESSMENT & PLAN:  ?Ductal carcinoma in situ (DCIS) of left breast ?11/21/2018: Screening detected left breast mammogram revealing calcifications: Biopsy revealed low-grade DCIS ER 100%, PR 100% Tis NX stage 0 ?  ?Treatment plan: Comet clinical trial participation (active monitoring arm) ?  ?Breast cancer surveillance: ?Mammogram at [facility] 04/08/2021: Suspicious mass left breast.  Stereotactic biopsy 04/30/2021: Fibrocystic change with calcifications ?Breast exam 06/23/2021: Benign ? ?  ?Current treatment: Patient decided not to take tamoxifen therapy. ?Palpable concern in the left breast: Felt to be benign by physical examination. ?  ?Return to clinic in 6 months for follow-up with another mammogram  ? ? ? ?No orders of the defined types were placed in this encounter. ? ?The  patient has a good understanding of the overall plan. she agrees with it. she will call with any problems that may develop before the next visit here. ?Total time spent: 30 mins including face to face time and time spent for planning, charting and co-ordination of care ? ? 06/28/2021, MD ?06/23/21 ? ? ? I Tamsen Meek am scribing for Dr. 08/23/21 ? ?I have reviewed the above documentation for accuracy and completeness, and I agree with the above. ?  ?

## 2021-06-23 ENCOUNTER — Ambulatory Visit (INDEPENDENT_AMBULATORY_CARE_PROVIDER_SITE_OTHER): Payer: Medicare PPO | Admitting: Family Medicine

## 2021-06-23 ENCOUNTER — Inpatient Hospital Stay: Payer: Medicare PPO | Attending: Hematology and Oncology | Admitting: Hematology and Oncology

## 2021-06-23 ENCOUNTER — Other Ambulatory Visit: Payer: Self-pay

## 2021-06-23 ENCOUNTER — Inpatient Hospital Stay: Payer: Medicare PPO | Admitting: Emergency Medicine

## 2021-06-23 DIAGNOSIS — D0512 Intraductal carcinoma in situ of left breast: Secondary | ICD-10-CM

## 2021-06-23 DIAGNOSIS — N6321 Unspecified lump in the left breast, upper outer quadrant: Secondary | ICD-10-CM | POA: Insufficient documentation

## 2021-06-23 DIAGNOSIS — Z006 Encounter for examination for normal comparison and control in clinical research program: Secondary | ICD-10-CM | POA: Diagnosis not present

## 2021-06-23 DIAGNOSIS — Z170 Estrogen receptor positive status [ER+]: Secondary | ICD-10-CM | POA: Diagnosis not present

## 2021-06-23 NOTE — Assessment & Plan Note (Signed)
11/21/2018:?Screening detected left breast mammogram revealing calcifications: Biopsy revealed low-grade DCIS ER 100%, PR 100%?Tis NX stage 0 ?? ?Treatment plan: Comet clinical trial participation?(active monitoring arm) ?? ?Breast cancer surveillance: ?Mammogram at [facility]?04/08/2021:?Suspicious mass left breast.  Stereotactic biopsy 04/30/2021: Fibrocystic change with calcifications ?Breast exam 06/23/2021: Benign ? ?? ?Current treatment: Patient decided not to take tamoxifen therapy. ?Menopausal symptoms: Occasional hot flashes, muscle stiffness, trigger finger ?? ?Return to clinic in?6 months for follow-up with another mammogram of the left breast. ?

## 2021-06-23 NOTE — Research (Signed)
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  ?55 MONTH VISIT ?  ?Patient arrives today Unaccompanied for their 30 month visit. ? ?ACTIVE MONITORING ARM. ?  ?PROs (QUESTIONNAIRES): ?Patient states she completes her questionnaires online but has not received them yet. Patient advised that she does not have any questionnaires due at this time, verbalizes understanding. ?  ?LABS: ?None required at this time. ?  ?MEDICATION REVIEW: ?Patient reviews and verifies the current medication list is correct.  Reported changes were recorded on the medication list and marked as reviewed. All other listed medications remain the same.  Patient states she is only taking her Wellbutrin every other day and will be completely stopping the medication soon.  Patient is not on endocrine therapy. ?  ?MAMMOGRAM: ?            Unilateral left sided mammogram last performed on 04/08/2021.  Previous calcifications from original diagnosis stable, new 0.5 cm irregular low density mass in L breast at 1 o'clock posterior depth is suspicious of malignancy.  Left sided breast ultrasound then performed 04/08/2021, no sonographic correlate for mass seen in upper outer left breast, posterior depth.  Biopsy was recommended as mass only seen in mammo.  Left breast biopsy performed 04/30/2021, benign findings, routine 6 month surveillance on COMET trial recommended.  MD Lindi Adie reviewed all results today.  Next mammogram due in July 2023, pt aware.  Order has been put in by provider. ?  ?MD/PROVIDER VISIT: ?Patient sees MD Gudena for today's visit.  See provider's notes for more information. ?  ?SOLICITED ADVERSE EVENTS: ALL GRADED USING CTCAE V. 4.0 PER PROTOCOL ?Patient reports resolution of hot flashes and myalgia. ?Patient has ongoing hypertension (Grade 1) today, no change, unrelated per MD, no action or reporting required at this time. ?Grade 0 AE's  include: allergic reaction, arthralgia, fever, hot flashes, myalgia, nausea, and fracture. ?Osteoporosis, high cholesterol, acute coronary syndrome, and cerebrovascular ischemia not evaluated. ?Ongoing unchanged baseline conditions: alopecia, multiple stiff joints (decreased ROM), and bilateral knee pain. ?Ongoing unchanged other AE's: trigger fingers and left breast soreness. ? Patient does not report any other AE's at this time. ? ?ADVERSE EVENT LOG:  ?Lindsey [name] ?[id number] ?PID# [id number] ?  ?06/23/2021 ?  ?Adverse Event Log ?  ?Study/Protocol: COMET ?Cycle: 30 month visit ?  ?Event Grade Onset Date Resolved Date Drug Name Attribution Treatment Comments  ?Hot flashes Grade 1 ~07/2019 06/23/2021 ?Resolved None N/A None    ?Myalgia Grade 1 Baseline 06/23/2021 ?Resolved None N/A None    ?Hypertension Grade 1 12/12/2019 Ongoing None N/A None   ?Alopecia Grade 1 Baseline Ongoing None N/A None   ?Multiple stiff joints (decreased ROM) Grade 1 Baseline Ongoing None N/A None   ?Pain bilateral knees Grade 1 Baseline Ongoing None N/A None   ?Trigger fingers (Musculoskeletal and connective tissue- other) Grade 1 ~07/2020 Ongoing None N/A None   ?Left breast soreness Grade 1 ~09/2020 Ongoing None N/A None   ?  ?  ?DISPOSITION: ?Upon completion off all study requirements, patient was escorted to exit with belongings.  Will contact patient in regards to scheduling her next appts.  Patient's visit today occurred greater than 4 weeks from mammogram date as staff was unaware patient received scan on 04/08/2021.  Pt encouraged to contact staff if next exam occurs earlier than anticipated.  Patient will be seen by MD Lindi Adie after next mammogram which is  due in July 2023. ?  ?The patient was thanked for their time and continued voluntary participation in this study. Patient has been provided direct contact information and is encouraged to contact this Nurse for any needs or questions. ? ?Lindsey Guiles '[name]' Endora Teresi, RN, BSN ?Clinical Research Nurse  I ?06/23/21 ?11:30 AM ? ?

## 2021-06-24 ENCOUNTER — Telehealth: Payer: Self-pay | Admitting: Hematology and Oncology

## 2021-06-24 ENCOUNTER — Other Ambulatory Visit: Payer: Self-pay | Admitting: *Deleted

## 2021-06-24 DIAGNOSIS — D0512 Intraductal carcinoma in situ of left breast: Secondary | ICD-10-CM

## 2021-06-24 NOTE — Telephone Encounter (Signed)
Scheduled appointment per 4/4 los. Patient is aware. Patient was transferred to the nurse to ask questions about upcoming mammogram. ?

## 2021-06-25 ENCOUNTER — Ambulatory Visit: Payer: BC Managed Care – PPO | Admitting: Hematology and Oncology

## 2021-06-30 ENCOUNTER — Telehealth: Payer: Self-pay | Admitting: Hematology and Oncology

## 2021-06-30 NOTE — Telephone Encounter (Signed)
.  Called patient to schedule appointment per 4/11 inbasket, patient is aware of date and time.   ?

## 2021-07-09 ENCOUNTER — Ambulatory Visit (INDEPENDENT_AMBULATORY_CARE_PROVIDER_SITE_OTHER): Payer: Medicare PPO | Admitting: Family Medicine

## 2021-07-13 ENCOUNTER — Ambulatory Visit (INDEPENDENT_AMBULATORY_CARE_PROVIDER_SITE_OTHER): Payer: Medicare PPO | Admitting: Family Medicine

## 2021-07-13 ENCOUNTER — Encounter (INDEPENDENT_AMBULATORY_CARE_PROVIDER_SITE_OTHER): Payer: Self-pay | Admitting: Family Medicine

## 2021-07-13 VITALS — BP 129/72 | HR 58 | Temp 98.0°F | Ht 66.0 in | Wt 191.0 lb

## 2021-07-13 DIAGNOSIS — E669 Obesity, unspecified: Secondary | ICD-10-CM

## 2021-07-13 DIAGNOSIS — F3289 Other specified depressive episodes: Secondary | ICD-10-CM | POA: Diagnosis not present

## 2021-07-13 DIAGNOSIS — Z6831 Body mass index (BMI) 31.0-31.9, adult: Secondary | ICD-10-CM | POA: Diagnosis not present

## 2021-07-23 DIAGNOSIS — M19012 Primary osteoarthritis, left shoulder: Secondary | ICD-10-CM | POA: Diagnosis not present

## 2021-07-23 DIAGNOSIS — M1711 Unilateral primary osteoarthritis, right knee: Secondary | ICD-10-CM | POA: Diagnosis not present

## 2021-07-23 DIAGNOSIS — M170 Bilateral primary osteoarthritis of knee: Secondary | ICD-10-CM | POA: Diagnosis not present

## 2021-07-23 DIAGNOSIS — M1712 Unilateral primary osteoarthritis, left knee: Secondary | ICD-10-CM | POA: Diagnosis not present

## 2021-07-23 NOTE — Progress Notes (Signed)
? ? ? ?Chief Complaint:  ? ?OBESITY ?Lindsey [name] is here to discuss her progress with her obesity treatment plan along with follow-up of her obesity related diagnoses. Lindsey [name] is on keeping a food journal and adhering to recommended goals of 1500-1800 calories and 120+ grams of protein daily and states she is following her eating plan approximately 50% of the time. Lindsey [name] states she is walking for 30-40 minutes 5 times per week. ? ?Today's visit was #: 45 ?Starting weight: 325 lbs ?Starting date: 05/25/2017 ?Today's weight: 191 lbs ?Today's date: 07/13/2021 ?Total lbs lost to date: 134 ?Total lbs lost since last in-office visit: 0 ? ?Interim History: Lindsey [name] does well with following her plan the first half of the day, but she struggles with night time eating issues, both with hunger and with cravings. She struggles with meal planning in the evenings  and she is bored with her choices. ? ?Subjective:  ? ?1. Other depression with emotional eating ?Lindsey [name] feels her Wellbutrin isn't helping to decrease her emotional eating behaviors. She is tapering herself off of this and should be finished soon.  ? ?Assessment/Plan:  ? ?1. Other depression with emotional eating ?Lindsey [name] agreed to discontinue Wellbutrin when her medication runs out. We will refer her to Dr. Mallie Mussel, our Bariatric Psychologist, with specific goals to work on such as "how do I feel less guilty when I indulge" or "how do I increase motivation when it wanes?". ? ?2. Obesity with current BMI of 31.0 ?Lindsey [name] is currently in the action stage of change. As such, her goal is to continue with weight loss efforts. She has agreed to keeping a food journal and adhering to recommended goals of 1500-1800 calories and 100+ grams of protein daily.  ? ?Exercise goals: As is. ? ?Behavioral modification strategies: increasing lean protein intake and meal planning and cooking strategies. ? ?Lindsey [name] has agreed to follow-up with our clinic in 4 weeks. She was informed of the importance of frequent  follow-up visits to maximize her success with intensive lifestyle modifications for her multiple health conditions.  ? ?Objective:  ? ?Blood pressure 129/72, pulse (!) 58, temperature 98 ?F (36.7 ?C), height 5\' 6"  (1.676 m), weight 191 lb (86.6 kg), SpO2 98 %. ?Body mass index is 30.83 kg/m?. ? ?General: Cooperative, alert, well developed, in no acute distress. ?HEENT: Conjunctivae and lids unremarkable. ?Cardiovascular: Regular rhythm.  ?Lungs: Normal work of breathing. ?Neurologic: No focal deficits.  ? ?Lab Results  ?Component Value Date  ? CREATININE 0.65 08/20/2020  ? BUN 22 08/20/2020  ? NA 145 (H) 08/20/2020  ? K 4.4 08/20/2020  ? CL 104 08/20/2020  ? CO2 24 08/20/2020  ? ?Lab Results  ?Component Value Date  ? ALT 14 08/20/2020  ? AST 19 08/20/2020  ? ALKPHOS 78 08/20/2020  ? BILITOT 0.6 08/20/2020  ? ?Lab Results  ?Component Value Date  ? HGBA1C 5.2 08/20/2020  ? HGBA1C 5.2 03/26/2019  ? HGBA1C 5.4 03/09/2018  ? HGBA1C 5.5 10/12/2017  ? HGBA1C 5.7 (H) 05/25/2017  ? ?Lab Results  ?Component Value Date  ? INSULIN 7.3 08/20/2020  ? INSULIN 4.4 03/26/2019  ? INSULIN 10.0 03/09/2018  ? INSULIN 19.1 10/12/2017  ? INSULIN 44.2 (H) 05/25/2017  ? ?Lab Results  ?Component Value Date  ? TSH 2.88 09/19/2020  ? ?Lab Results  ?Component Value Date  ? CHOL 178 09/24/2020  ? HDL 76 (A) 09/24/2020  ? [place] 92 09/24/2020  ? TRIG 50 09/19/2020  ? ?Lab Results  ?Component Value Date  ?  VD25OH 69.5 08/20/2020  ? VD25OH 64.7 06/26/2019  ? VD25OH 56.6 03/26/2019  ? ?Lab Results  ?Component Value Date  ? WBC 6.0 05/25/2017  ? HGB 13.6 05/25/2017  ? HCT 40.7 05/25/2017  ? MCV 91 05/25/2017  ? ?Lab Results  ?Component Value Date  ? FERRITIN 102.0 09/19/2020  ? ?Attestation Statements:  ? ?Reviewed by clinician on day of visit: allergies, medications, problem list, medical history, surgical history, family history, social history, and previous encounter notes. ? ?Time spent on visit including pre-visit chart review and post-visit  care and charting was 35 minutes.  ? ? ?I, Trixie Dredge, am acting as transcriptionist for Dennard Nip, MD. ? ?I have reviewed the above documentation for accuracy and completeness, and I agree with the above. -  Dennard Nip, MD ? ? ?

## 2021-07-30 NOTE — Progress Notes (Unsigned)
Office: [phone]  /  Fax: [phone]    Date: 08/11/2021   Appointment Start Time: 11:00am Duration: 57 minutes Provider: Glennie Isle, Psy.D. Type of Session: Intake for Individual Therapy  Location of Patient: Home (private location) Location of Provider: Provider's home (private office) Type of Contact: Telepsychological Visit via MyChart Video Visit  Informed Consent: Prior to proceeding with today's appointment, two pieces of identifying information were obtained. In addition, Lindsey [name]'s physical location at the time of this appointment was obtained as well a phone number she could be reached at in the event of technical difficulties. Lindsey [name] and this provider participated in today's telepsychological service.   The provider's role was explained to Lindsey [name]. The provider reviewed and discussed issues of confidentiality, privacy, and limits therein (e.g., reporting obligations). In addition to verbal informed consent, written informed consent for psychological services was obtained prior to the initial appointment. Since the clinic is not a 24/7 crisis center, mental health emergency resources were shared and this  provider explained MyChart, e-mail, voicemail, and/or other messaging systems should be utilized only for non-emergency reasons. This provider also explained that information obtained during appointments will be placed in [facility] [facility] record and relevant information will be shared with other providers at [facility] for coordination of care. Lindsey [name] agreed information may be shared with other [facility] providers as needed for coordination of care and by signing the service agreement document, she provided written consent for coordination of care. Prior to initiating telepsychological services, Lindsey [name] completed an informed consent document, which included the development of a safety plan (i.e., an emergency contact and emergency resources) in the event  of an emergency/crisis. Lindsey [name] verbally acknowledged understanding she is ultimately responsible for understanding her insurance benefits for telepsychological and in-person services. This provider also reviewed confidentiality, as it relates to telepsychological services, as well as the rationale for telepsychological services (i.e., to reduce exposure risk to COVID-19). Lindsey [name]  acknowledged understanding that appointments cannot be recorded without both party consent and she is aware she is responsible for securing confidentiality on her end of the session. Lindsey [name] verbally consented to proceed.  Per [facility]'s new policy, Lindsey [name] will be e-mailed two additional forms (AOB and Receipt of NPP) to sign as well as Lindsey [name]'s Notice of Privacy Practices. The content of the documents were explained to Lindsey [name] at the onset of the appointment, and she agreed to proceed. Additionally, she agreed to complete the forms and return them prior to the next appointment with this provider.   Chief Complaint/HPI: Lindsey [name] was referred by Lindsey [name] due to other depression, with emotional eating. Per the note for the visit with Lindsey [name] on 07/13/2021, "Lindsey [name] agreed to discontinue Wellbutrin when her medication runs out. We will refer her to Dr. Mallie Mussel, our Bariatric Psychologist, with specific goals to work on such as "how do I feel less guilty when I indulge" or "how do I increase motivation when it wanes?"." Lindsey [name] previously met with this provider with the last appointment being 04/03/2019.  During today's appointment, Lindsey [name] shared, "I lost a lot of weight and it went pretty well for a long time." She acknowledged challenges some days resulting in her "treating[ing]" herself. Lindsey [name] reflected around the holidays she started to "slip" and was "unable to get back on track." She described sometimes giving herself grace when eating more than the recommended calories, but acknowledged that sometimes it is more than an additional couple  hundred calories. Notably, she described occassionally  overeating by grazing on different foods. Lindsey [name] further stated she is "continuously thinking about weight loss" and "in [her] head." She indicated experiencing "temptations" at "four in the afternoon," and described experiencing all or nothing thinking when she gives in. Due to the aforementioned challenges, she discussed gaining "about 20 pounds." She identified having more snacks around the office have also resulted in "temptations." Lindsey [name] also tried altering her eating schedule to help reduce overeating. Moreover, she denied engagement in binge eating behaviors and restricting food intake, purging and engagement in other compensatory strategies for weight loss.   Mental Status Examination:  Appearance: neat Behavior: appropriate to circumstances Mood: anxious Affect: mood congruent Speech: WNL Eye Contact: intermittent Psychomotor Activity: WNL Gait: unable to assess  Thought Process: linear, logical, and goal directed and denies suicidal, homicidal, and self-harm ideation, plan and intent  Thought Content/Perception: no hallucinations, delusions, bizarre thinking or behavior endorsed or observed Orientation: AAOx4 Memory/Concentration: memory, attention, language, and fund of knowledge intact  Insight/Judgment: fair  Family & Psychosocial History: Lindsey [name] reported she is not in a relationship. 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 Additionally, Lindsey [name] shared her highest level of education obtained is two bachelor's degrees. Currently, Lindsey [name]'s social support system consists of her dog, friends, and mother. Moreover, Lindsey [name] stated she resides with her dog.   Medical History:  Past Medical History:  Diagnosis Date   Dry skin    H/O seasonal allergies    Hair loss    Knee pain    Multiple stiff joints    Nasal congestion    Skin tag    Snoring    Urgency of urination    Past Surgical History:  Procedure Laterality Date   FOOT  SURGERY     TONSILLECTOMY     Current Outpatient Medications on File Prior to Visit  Medication Sig Dispense Refill   BIOTIN PO Take 1 each by mouth daily.     Cholecalciferol (VITAMIN D3) 125 MCG (5000 UT) CAPS Take 1 capsule by mouth daily.     fluticasone (FLONASE) 50 MCG/ACT nasal spray Place 1 spray into both nostrils daily.  2   Multiple Vitamins-Minerals (WOMENS MULTIVITAMIN PLUS PO) Take 1 tablet by mouth daily.     Turmeric 500 MG CAPS Take 1 capsule by mouth daily.     No current facility-administered medications on file prior to visit.   Mental Health History: Jodel reported she has not attended therapeutic or psychiatric services since last meeting with this provider. She acknowledged she was prescribed Wellbutrin, but discontinued use as it was not helping. Agatha reported there is no history of hospitalizations for psychiatric concerns. Regarding family history of mental health related concerns, Junie shared her mother is developing "some memory issues." Jodelle continues to deny a trauma history.   Avani described her typical mood lately as "okay," adding she would like to have "more work, more income, a relationship, and a social life." She does not feel she has been "depressed," "unduly frustrated," or "worried or nervous." Rupal reported worry about her mother's health and "losing her." She further discussed "some monetary issues" that have resulted in "frustration and worry." While she thinks about death due to age, her mother's age, and well-being/age of family members, she denied experiencing suicidal ideation since what she shared during her initial appointment with this provider in August of 2019. While she denied concerns related self-esteem and image, she reported others "do not like being around [her]." Moreover, Dessiree denied current alcohol use. She  denied tobacco use. She denied illicit/recreational substance use. Furthermore, Adrien indicated she is not experiencing the following:  hallucinations and delusions, paranoia, symptoms of mania , social withdrawal, crying spells, panic attacks, memory concerns, attention and concentration issues, and obsessions and compulsions. She also denied current suicidal ideation, plan, and intent; history of and current homicidal ideation, plan, and intent; and history of and current engagement in self-harm.  The following strengths were reported by Lindsey [name]: "pretty smart" and "good memory." The following strengths were observed by this provider: ability to express thoughts and feelings during the therapeutic session, ability to establish and benefit from a therapeutic relationship, willingness to work toward established goal(s) with the clinic and ability to engage in reciprocal conversation.  Legal History: Tressy reported there is no history of legal involvement.   Structured Assessments Results: The Patient Health Questionnaire-9 (PHQ-9) is a self-report measure that assesses symptoms and severity of depression over the course of the last two weeks. Brithany obtained a score of 1 suggesting minimal depression. Vercie finds the endorsed symptoms to be not difficult at all. [0= Not at all; 1= Several days; 2= More than half the days; 3= Nearly every day] Little interest or pleasure in doing things 0  Feeling down, depressed, or hopeless 0  Trouble falling or staying asleep, or sleeping too much 0  Feeling tired or having little energy 0  Poor appetite or overeating 1  Feeling bad about yourself --- or that you are a failure or have let yourself or your family down 0  Trouble concentrating on things, such as reading the newspaper or watching television 0  Moving or speaking so slowly that other people could have noticed? Or the opposite --- being so fidgety or restless that you have been moving around a lot more than usual 0  Thoughts that you would be better off dead or hurting yourself in some way 0  PHQ-9 Score 1    The Generalized Anxiety  Disorder-7 (GAD-7) is a brief self-report measure that assesses symptoms of anxiety over the course of the last two weeks. Lilliemae obtained a score of 0. [0= Not at all; 1= Several days; 2= Over half the days; 3= Nearly every day] Feeling nervous, anxious, on edge 0  Not being able to stop or control worrying 0  Worrying too much about different things 0  Trouble relaxing 0  Being so restless that it's hard to sit still 0  Becoming easily annoyed or irritable 0  Feeling afraid as if something awful might happen 0  GAD-7 Score 0   Interventions:  Conducted a chart review Focused on rapport building Verbally administered PHQ-9 and GAD-7 for symptom monitoring Provided emphatic reflections and validation Reviewed triggers for emotional eating behaviors  Diagnostic Impressions & Provisional DSM-5 Diagnosis(es): Maryrose reported a history of engagement in emotional eating behaviors and has previously met with this provider to address concerns. She described overeating, but denied engagement in binge eating behaviors and other disordered eating behaviors (e.g., purging). Tana further discussed frequently thinking about weight loss. Based on the aforementioned, the following diagnosis was assigned: F50.89 Other Specified Feeding or Eating Disorder, Emotional Eating Behaviors.  Plan: Genora appears able and willing to participate as evidenced by collaboration on a treatment goal, engagement in reciprocal conversation, and asking questions as needed for clarification. The next appointment is scheduled for 09/07/2021 at 11am, which will be via MyChart Video Visit. The following treatment goal was established: increase coping skills. This provider will regularly review the  treatment plan and medical chart to keep informed of status changes. Tiffani expressed understanding and agreement with the initial treatment plan of care. Reviewed triggers for emotional eating. Yaira provided verbal consent during today's  appointment for this provider to send a handout about triggers via e-mail. Additionally, Jaleisa will complete and return forms.

## 2021-08-06 DIAGNOSIS — M19012 Primary osteoarthritis, left shoulder: Secondary | ICD-10-CM | POA: Diagnosis not present

## 2021-08-06 DIAGNOSIS — M170 Bilateral primary osteoarthritis of knee: Secondary | ICD-10-CM | POA: Diagnosis not present

## 2021-08-10 ENCOUNTER — Ambulatory Visit (INDEPENDENT_AMBULATORY_CARE_PROVIDER_SITE_OTHER): Payer: Medicare PPO | Admitting: Family Medicine

## 2021-08-11 ENCOUNTER — Telehealth (INDEPENDENT_AMBULATORY_CARE_PROVIDER_SITE_OTHER): Payer: Medicare PPO | Admitting: Psychology

## 2021-08-11 DIAGNOSIS — F5089 Other specified eating disorder: Secondary | ICD-10-CM

## 2021-08-27 ENCOUNTER — Telehealth: Payer: Self-pay | Admitting: Emergency Medicine

## 2021-08-27 NOTE — Telephone Encounter (Signed)
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  Will try again in a few days.  Wells Guiles 'Learta CoddingNeysa Bonito, RN, BSN Clinical Research Nurse I 08/27/21 9:22 AM

## 2021-08-30 NOTE — Progress Notes (Signed)
  Office: [phone]  /  Fax: [phone]    Date: 09/07/2021   Appointment Start Time: 11:02am Duration: 32 minutes Provider: Lawerance Cruel, Psy.D. Type of Session: Individual Therapy  Location of Patient: Home (private location) Location of Provider: Provider's Home (private office) Type of Contact: Telepsychological Visit via MyChart Video Visit  Session Content: Lindsey [name] is a 66 y.o. female presenting for a follow-up appointment to address the previously established treatment goal of increasing coping skills.Today's appointment was a telepsychological visit due to COVID-19. Lindsey [name] provided verbal consent for today's telepsychological appointment and she is aware she is responsible for securing confidentiality on her end of the session. Prior to proceeding with today's appointment, Lindsey [name]'s physical location at the time of this appointment was obtained as well a phone number she could be reached at in the event of technical difficulties. Lindsey [name] and this provider participated in today's telepsychological service.   This provider conducted a brief check-in. Lindsey [name] described her eating habits as "terrible." She feels she has "not been in control at all." Lindsey [name] shared recent instances of deviating from her goals. Briefly reviewed triggers for emotional eating behaviors. It was reflected stress may be playing a role. Psychoeducation provided regarding cortisol and  its impact on well-being, hunger, and weight loss. Remainder of session focused on stress management. She discussed engaging in tasks that are hands-on projects are helpful with stress management. As such, she was engaged in brainstorming and encouraged to focus on  small projects. Overall, Lindsey [name] was receptive to today's appointment as evidenced by openness to sharing, responsiveness to feedback, and willingness to implement discussed strategies .  Mental Status Examination:  Appearance: neat Behavior: appropriate to circumstances Mood:  anxious Affect: mood congruent Speech: WNL Eye Contact: intermittent Psychomotor Activity: WNL Gait: unable to assess Thought Process: linear, logical, and goal directed and no evidence or endorsement of suicidal, homicidal, and self-harm ideation, plan and intent  Thought Content/Perception: no hallucinations, delusions, bizarre thinking or behavior endorsed or observed Orientation: AAOx4 Memory/Concentration: memory, attention, language, and fund of knowledge intact  Insight: fair Judgment: fair  Interventions:  Conducted a brief chart review Provided empathic reflections and validation Employed supportive psychotherapy interventions to facilitate reduced distress and to improve coping skills with identified stressors Engaged patient in problem solving  DSM-5 Diagnosis(es):  F50.89 Other Specified Feeding or Eating Disorder, Emotional Eating Behaviors.  Treatment Goal & Progress: During the initial appointment with this provider, the following treatment goal was established: increase coping skills. Lindsey [name] has demonstrated progress in her goal as evidenced by increased awareness of hunger patterns and increased awareness of triggers for emotional eating behaviors. Lindsey [name] also continues to demonstrate willingness to engage in learned skill(s).  Plan: The next appointment is scheduled for 09/28/2021 at 2pm, which will be via MyChart Video Visit. The next session will focus on working towards the established treatment goal.

## 2021-08-31 ENCOUNTER — Ambulatory Visit (INDEPENDENT_AMBULATORY_CARE_PROVIDER_SITE_OTHER): Payer: Medicare PPO | Admitting: Family Medicine

## 2021-09-03 ENCOUNTER — Other Ambulatory Visit: Payer: Self-pay | Admitting: *Deleted

## 2021-09-03 DIAGNOSIS — D0512 Intraductal carcinoma in situ of left breast: Secondary | ICD-10-CM

## 2021-09-03 NOTE — Progress Notes (Signed)
RN successfully faxed mammogram order to [facility].

## 2021-09-07 ENCOUNTER — Telehealth (INDEPENDENT_AMBULATORY_CARE_PROVIDER_SITE_OTHER): Payer: Medicare PPO | Admitting: Psychology

## 2021-09-07 ENCOUNTER — Telehealth: Payer: Self-pay | Admitting: Emergency Medicine

## 2021-09-07 DIAGNOSIS — F5089 Other specified eating disorder: Secondary | ICD-10-CM

## 2021-09-07 NOTE — Telephone Encounter (Signed)
AFT - 25: COMPARING AN OPERATION TO MONITORING, WITH OR WITHOUT ENDOCRINE THERAPY (COMET) FOR LOW RISK DCIS: A PHASE III PROSPECTIVE RANDOMIZED TRIAL  Called pt to let her know it was time to schedule bilateral mammogram per study.  Pt given my contact info and imaging [facility]) contact info, aware to schedule for around 10/07/21 and call back when appt has been created.  Lurena Joiner 'Warden FillersDairl Ponder, RN, BSN Clinical Research Nurse I 09/07/21 9:32 AM

## 2021-09-08 ENCOUNTER — Telehealth: Payer: Self-pay | Admitting: Emergency Medicine

## 2021-09-08 NOTE — Telephone Encounter (Signed)
AFT - 25: COMPARING AN OPERATION TO MONITORING, WITH OR WITHOUT ENDOCRINE THERAPY (COMET) FOR LOW RISK DCIS: A PHASE III PROSPECTIVE RANDOMIZED TRIAL  Patient called to let us know she has a mammogram scheduled for 10/21/21.  Confirmed upcoming appt with Dr. Pamelia Hoit on 10/23/21.  Pt denies any questions/concerns at this time, aware to f/u as needed before appt.  Lurena Joiner 'Warden FillersDairl Ponder, RN, BSN Clinical Research Nurse I 09/08/21 9:43 AM

## 2021-09-14 NOTE — Progress Notes (Signed)
  Office: [phone]  /  Fax: [phone]    Date: 09/19/2021   Appointment Start Time: 2:09pm Duration: 32 minutes Provider: Lawerance Cruel, Psy.D. Type of Session: Individual Therapy  Location of Patient: Home (private location) Location of Provider: Provider's Home (private office) Type of Contact: Telepsychological Visit via MyChart Video Visit  Session Content: Lindsey [name] is a 66 y.o. female presenting for a follow-up appointment to address the previously established treatment goal of increasing coping skills.Today's appointment was a telepsychological visit. Lindsey [name] provided verbal consent for today's telepsychological appointment and she is aware she is responsible for securing confidentiality on her end of the session. Prior to proceeding with today's appointment, Lindsey [name]'s physical location at the time of this appointment was obtained as well a phone number she could be reached at in the event of technical difficulties. Lindsey [name] and this provider participated in today's telepsychological service.   This provider conducted a brief check-in. Lindsey [name] shared continued challenges with her eating habits. However, she reported an improvement in sugar cravings. Further explored and processed. Lindsey [name] indicated eating with others has helped her make better choices, but is unsure what else may be playing a role. This provider reviewed the impact of stress on eating habits and how self-care/pleasurable activities can help with stress management. Briefly discussed motivation. Overall, Lindsey [name] was receptive to today's appointment as evidenced by openness to sharing, responsiveness to feedback, and willingness to continue engaging in learned skills.  Mental Status Examination:  Appearance: neat Behavior: appropriate to circumstances Mood: neutral Affect: mood congruent Speech: WNL Eye Contact: intermittent  Psychomotor Activity: WNL Gait: unable to assess Thought Process: linear, logical, and goal directed and no  evidence or endorsement of suicidal, homicidal, and self-harm ideation, plan and intent  Thought Content/Perception: no hallucinations, delusions, bizarre thinking or behavior endorsed or observed Orientation: AAOx4 Memory/Concentration: memory, attention, language, and fund of knowledge intact  Insight: fair Judgment: fair  Interventions:  Conducted a brief chart review Provided empathic reflections and validation Reviewed content from the previous session Employed supportive psychotherapy interventions to facilitate reduced distress and to improve coping skills with identified stressors Employed motivational interviewing skills to assess patient's willingness/desire to adhere to recommended medical treatments and assignments  DSM-5 Diagnosis(es): F50.89 Other Specified Feeding or Eating Disorder, Emotional Eating Behaviors  Treatment Goal & Progress: During the initial appointment with this provider, the following treatment goal was established: increase coping skills. Lindsey [name] has demonstrated progress in her goal as evidenced by increased awareness of hunger patterns and increased awareness of triggers for emotional eating behaviors. Lindsey [name] also continues to demonstrate willingness to engage in learned skill(s).  Plan: Based on appointment availability and Lindsey [name]'s schedule, the next appointment is scheduled for 10/26/2021 at 2:00pm, which will be via MyChart Video Visit. The next session will focus on working towards the established treatment goal.

## 2021-09-17 ENCOUNTER — Ambulatory Visit (INDEPENDENT_AMBULATORY_CARE_PROVIDER_SITE_OTHER): Payer: Medicare PPO | Admitting: Family Medicine

## 2021-09-21 ENCOUNTER — Ambulatory Visit: Payer: Medicare PPO | Admitting: Hematology and Oncology

## 2021-09-28 ENCOUNTER — Telehealth (INDEPENDENT_AMBULATORY_CARE_PROVIDER_SITE_OTHER): Payer: Medicare PPO | Admitting: Psychology

## 2021-09-28 DIAGNOSIS — F5089 Other specified eating disorder: Secondary | ICD-10-CM | POA: Diagnosis not present

## 2021-10-08 ENCOUNTER — Ambulatory Visit (INDEPENDENT_AMBULATORY_CARE_PROVIDER_SITE_OTHER): Payer: Medicare PPO | Admitting: Family Medicine

## 2021-10-12 NOTE — Progress Notes (Signed)
  Office: [phone]  /  Fax: [phone]    Date: 10/26/2021   Appointment Start Time: 2:03pm Duration: 30 minutes Provider: Lawerance Cruel, Psy.D. Type of Session: Individual Therapy  Location of Patient: Home (private location) Location of Provider: Provider's Home (private office) Type of Contact: Telepsychological Visit via MyChart Video Visit  Session Content: Lindsey [name] is a 66 y.o. female presenting for a follow-up appointment to address the previously established treatment goal of increasing coping skills.Today's appointment was a telepsychological visit. Lindsey [name] provided verbal consent for today's telepsychological appointment and she is aware she is responsible for securing confidentiality on her end of the session. Prior to proceeding with today's appointment, Lindsey [name]'s physical location at the time of this appointment was obtained as well a phone number she could be reached at in the event of technical difficulties. Lindsey [name] and this provider participated in today's telepsychological service.   This provider conducted a brief check-in. Lindsey [name] described an improvement in her eating habits. She stated she feels she needs to eat something when she is "preparing food." Further explored and processed. She discussed an instance from last night and feels "a lot of the eating has to do with rebellion." Lindsey [name] added, "I'm a little kid inside. I'm 4." Associated thoughts and feelings were briefly processed. Lindsey [name] discussed a belief that the inner child is related to control. This provider and Lindsey [name] discussed the benefit of establishing a balance. As such, this provider recommended longer-term therapeutic services. She noted, "I'm not interested in re-starting traditional therapy." This provider and Lindsey [name] discussed using the inner voice that suggests rebellion as an opportunity to reflect on the day and determine what might have occurred resulting for a desire of control. She was receptive. Of note, Lindsey [name] reported she  reviewed the note for the previous appointment (09/19/2021) and indicated there was a statement that was incorrect. More specifically, she explained eating with others is not helpful in making better choices. She added it "makes it harder" to make better choices when eating out with others. Overall, Lindsey [name] was receptive to today's appointment as evidenced by openness to sharing, responsiveness to feedback, and willingness to continue engaging in learned skills.  Mental Status Examination:  Appearance: neat Behavior: appropriate to circumstances Mood: neutral Affect: mood congruent Speech: WNL Eye Contact: intermittent  Psychomotor Activity: WNL Gait: unable to assess Thought Process: linear, logical, and goal directed and no evidence or endorsement of suicidal, homicidal, and self-harm ideation, plan and intent  Thought Content/Perception: no hallucinations, delusions, bizarre thinking or behavior endorsed or observed Orientation: AAOx4 Memory/Concentration: memory, attention, language, and fund of knowledge intact  Insight: fair Judgment: fair  Interventions:  Conducted a brief chart review Provided empathic reflections and validation Employed supportive psychotherapy interventions to facilitate reduced distress and to improve coping skills with identified stressors  DSM-5 Diagnosis(es):  F50.89 Other Specified Feeding or Eating Disorder, Emotional Eating Behaviors  Treatment Goal & Progress: During the initial appointment with this provider, the following treatment goal was established: increase coping skills. Lindsey [name] has demonstrated progress in her goal as evidenced by increased awareness of hunger patterns and increased awareness of triggers for emotional eating behaviors. Lindsey [name] also continues to demonstrate willingness to engage in learned skill(s).  Plan: Lindsey [name] declined future appointments with this provider, noting, "Let's not schedule that yet." She acknowledged understanding that she may  request a follow-up appointment with this provider in the future as long as she is still established with the clinic. No further follow-up planned by this provider.

## 2021-10-13 ENCOUNTER — Encounter (INDEPENDENT_AMBULATORY_CARE_PROVIDER_SITE_OTHER): Payer: Self-pay | Admitting: Family Medicine

## 2021-10-13 ENCOUNTER — Ambulatory Visit (INDEPENDENT_AMBULATORY_CARE_PROVIDER_SITE_OTHER): Payer: Medicare PPO | Admitting: Family Medicine

## 2021-10-13 VITALS — BP 134/75 | HR 64 | Temp 98.2°F | Ht 66.0 in | Wt 201.0 lb

## 2021-10-13 DIAGNOSIS — Z6832 Body mass index (BMI) 32.0-32.9, adult: Secondary | ICD-10-CM

## 2021-10-13 DIAGNOSIS — F3289 Other specified depressive episodes: Secondary | ICD-10-CM

## 2021-10-13 DIAGNOSIS — E669 Obesity, unspecified: Secondary | ICD-10-CM

## 2021-10-16 ENCOUNTER — Other Ambulatory Visit: Payer: Self-pay | Admitting: Emergency Medicine

## 2021-10-16 ENCOUNTER — Telehealth: Payer: Self-pay | Admitting: Emergency Medicine

## 2021-10-16 DIAGNOSIS — D0512 Intraductal carcinoma in situ of left breast: Secondary | ICD-10-CM

## 2021-10-16 NOTE — Progress Notes (Signed)
Patient Care Team: Wilder Glade, MD as PCP - General (Family Medicine) Serena Croissant, MD as Consulting Physician (Hematology and Oncology)  DIAGNOSIS:  Encounter Diagnosis  Name Primary?   Ductal carcinoma in situ (DCIS) of left breast     SUMMARY OF ONCOLOGIC HISTORY: Oncology History  Ductal carcinoma in situ (DCIS) of left breast  11/21/2018 Initial Diagnosis   Routine screening mammogram detected calcifications in the left breast. Biopsy showed low-grade DCIS, ER+ 100%, PR+ 100%.      CHIEF COMPLIANT: Follow-up of DCIS on Comet clinical trial  INTERVAL HISTORY: Lindsey [name] is a 66 year old with above-mentioned history of low-grade DCIS who is currently on Comet clinical trial and is on surveillance.  She had a mammogram recently on both her breasts and it came back as stable disease in the left breast.  She is not taking any antiestrogen therapy and appears to be doing quite well.  It has been 3 years from her diagnosis.   ALLERGIES:  is allergic to lactose intolerance (gi), sulfa antibiotics, and amoxicillin.  MEDICATIONS:  Current Outpatient Medications  Medication Sig Dispense Refill   BIOTIN PO Take 1 each by mouth daily.     Cholecalciferol (VITAMIN D3) 125 MCG (5000 UT) CAPS Take 1 capsule by mouth daily.     fluticasone (FLONASE) 50 MCG/ACT nasal spray Place 1 spray into both nostrils daily.  2   Multiple Vitamins-Minerals (WOMENS MULTIVITAMIN PLUS PO) Take 1 tablet by mouth daily.     Turmeric 500 MG CAPS Take 1 capsule by mouth daily.     No current facility-administered medications for this visit.    PHYSICAL EXAMINATION: ECOG PERFORMANCE STATUS: 1 - Symptomatic but completely ambulatory  Vitals:   10/23/21 1001  BP: 133/87  Pulse: 63  Resp: 18  Temp: 97.7 F (36.5 C)  SpO2: 98%   Filed Weights   10/23/21 1001  Weight: 204 lb 9.6 oz (92.8 kg)      LABORATORY DATA:  I have reviewed the data as listed    Latest Ref Rng & Units 08/20/2020     1:25 PM 03/26/2019   11:40 AM 03/09/2018    9:22 AM  CMP  Glucose 65 - 99 mg/dL 84  81  92   BUN 8 - 27 mg/dL 22  17  18    Creatinine 0.57 - 1.00 mg/dL  9.62  9.52   Sodium 134 - 144 mmol/L 145  145  141   Potassium 3.5 - 5.2 mmol/L 4.4  4.1  4.7   Chloride 96 - 106 mmol/L 104  107  102   CO2 20 - 29 mmol/L 24  25  26    Calcium 8.7 - 10.3 mg/dL 9.9  9.7  9.7   Total Protein 6.0 - 8.5 g/dL 6.9  6.4  6.6   Total Bilirubin 0.0 - 1.2 mg/dL 0.6  1.1  0.8   Alkaline Phos 44 - 121 IU/L 78  68  79   AST 0 - 40 IU/L 19  17  17    ALT 0 - 32 IU/L 14  14  20      Lab Results  Component Value Date   WBC 6.0 05/25/2017   HGB 13.6 05/25/2017   HCT 40.7 05/25/2017   MCV 91 05/25/2017   NEUTROABS 3.3 05/25/2017    ASSESSMENT & PLAN:  Ductal carcinoma in situ (DCIS) of left breast 11/21/2018: Screening detected left breast mammogram revealing calcifications: Biopsy revealed low-grade DCIS ER 100%, PR 100%  Tis NX stage 0   Treatment plan: Comet clinical trial participation (active monitoring arm)   Breast cancer surveillance: Mammogram at [facility] 10/21/2021: Stable appearance of the left breast of previous biopsy-proven DCIS. (Suspicious mass left breast.  Stereotactic biopsy 04/30/2021: Fibrocystic change with calcifications) Breast exam 06/23/2021: Benign     Current treatment: Patient decided not to take tamoxifen therapy.   Return to clinic in 6 months for follow-up with another mammogram     Orders Placed This Encounter  Procedures   MM DIAG BREAST TOMO UNI LEFT    Standing Status:   Future    Standing Expiration Date:   10/24/2022    Order Specific Question:   Reason for Exam (SYMPTOM  OR DIAGNOSIS REQUIRED)    Answer:   Left breast DCIS    Order Specific Question:   Preferred imaging location?    Answer:   External    Comments:   [facility]    Order Specific Question:   Release to patient    Answer:   Immediate   The patient has a good understanding of the overall plan. she agrees  with it. she will call with any problems that may develop before the next visit here. Total time spent: 30 mins including face to face time and time spent for planning, charting and co-ordination of care   Tamsen Meek, MD 10/23/21    I Janan Ridge am scribing for Dr. Pamelia Hoit  I have reviewed the above documentation for accuracy and completeness, and I agree with the above.

## 2021-10-16 NOTE — Telephone Encounter (Signed)
AFT - 25: COMPARING AN OPERATION TO MONITORING, WITH OR WITHOUT ENDOCRINE THERAPY (COMET) FOR LOW RISK DCIS: A PHASE III PROSPECTIVE RANDOMIZED TRIAL  Called to confirm appts on 8/2 (mammogram) and 10/23/21 and let patient know blood draw appt was added on for 8/4.  Pt verbalized understanding, denies any questions/concerns at this time.  Lurena Joiner 'Warden FillersDairl Ponder, RN, BSN Clinical Research Nurse I 10/16/21 1:45 PM

## 2021-10-20 NOTE — Progress Notes (Unsigned)
Chief Complaint:   OBESITY Lindsey [name] is here to discuss her progress with her obesity treatment plan along with follow-up of her obesity related diagnoses. Lindsey [name] is on keeping a food journal and adhering to recommended goals of 1500-1800 calories and 100+ grams of protein daily and states she is following her eating plan approximately 85% of the time. Lindsey [name] states she is walking for 30 minutes 6 times per week.  Today's visit was #: 41 Starting weight: 325 lbs Starting date: 05/25/2017 Today's weight: 201 lbs Today's date: 10/13/2021 Total lbs lost to date: 124 Total lbs lost since last in-office visit: 0  Interim History: Lindsey [name]'s last visit was approximately 3 months ago, and her weight has been going in the wrong direction.  She is working with Dr. Dewaine Conger on emotional eating behaviors.  She states she is doing better with journaling and decreased cravings in the last 3 weeks.  She is doing better with meeting her calorie goal versus her protein goal.  Subjective:   1. Other depression with emotional eating Lindsey [name] has struggled with increased comfort eating.  She is working on getting better control of this.  She often feels food is her main source of happiness.  Assessment/Plan:   1. Other depression with emotional eating Emotional eating behavior was discussed today.  Lindsey [name] will continue to work with Dr. Dewaine Conger and hopefully will be able to pinpoint what gets her off track with her healthy eating habits.  2. Obesity, Current BMI 32.5 Lindsey [name] is currently in the action stage of change. As such, her goal is to continue with weight loss efforts. She has agreed to keeping a food journal and adhering to recommended goals of 1500-1800 calories and 100+ grams of protein daily.   Exercise goals: As is.   Behavioral modification strategies: increasing lean protein intake, decreasing simple carbohydrates, better snacking choices, and emotional eating strategies.  Lindsey [name] has agreed to follow-up with our  clinic in 4 weeks. She was informed of the importance of frequent follow-up visits to maximize her success with intensive lifestyle modifications for her multiple health conditions.   Objective:   Blood pressure 134/75, pulse 64, temperature 98.2 F (36.8 C), height 5\' 6"  (1.676 m), weight 201 lb (91.2 kg), SpO2 98 %. Body mass index is 32.44 kg/m.  General: Cooperative, alert, well developed, in no acute distress. HEENT: Conjunctivae and lids unremarkable. Cardiovascular: Regular rhythm.  Lungs: Normal work of breathing. Neurologic: No focal deficits.   Lab Results  Component Value Date   CREATININE 0.65 08/20/2020   BUN 22 08/20/2020   NA 145 (H) 08/20/2020   K 4.4 08/20/2020   CL 104 08/20/2020   CO2 24 08/20/2020   Lab Results  Component Value Date   ALT 14 08/20/2020   AST 19 08/20/2020   ALKPHOS 78 08/20/2020   BILITOT 0.6 08/20/2020   Lab Results  Component Value Date   HGBA1C 5.2 08/20/2020   HGBA1C 5.2 03/26/2019   HGBA1C 5.4 03/09/2018   HGBA1C 5.5 10/12/2017   HGBA1C 5.7 (H) 05/25/2017   Lab Results  Component Value Date   INSULIN 7.3 08/20/2020   INSULIN 4.4 03/26/2019   INSULIN 10.0 03/09/2018   INSULIN 19.1 10/12/2017   INSULIN 44.2 (H) 05/25/2017   Lab Results  Component Value Date   TSH 2.88 09/19/2020   Lab Results  Component Value Date   CHOL 178 09/24/2020   HDL 76 (A) 09/24/2020   LDLCALC 92 09/24/2020   TRIG 50 09/19/2020  Lab Results  Component Value Date   VD25OH 69.5 08/20/2020   VD25OH 64.7 06/26/2019   VD25OH 56.6 03/26/2019   Lab Results  Component Value Date   WBC 6.0 05/25/2017   HGB 13.6 05/25/2017   HCT 40.7 05/25/2017   MCV 91 05/25/2017   Lab Results  Component Value Date   FERRITIN 102.0 09/19/2020   Attestation Statements:   Reviewed by clinician on day of visit: allergies, medications, problem list, medical history, surgical history, family history, social history, and previous encounter notes.  Time  spent on visit including pre-visit chart review and post-visit care and charting was 45 minutes.   I, Burt Knack, am acting as transcriptionist for Quillian Quince, MD.  I have reviewed the above documentation for accuracy and completeness, and I agree with the above. -  ***

## 2021-10-21 ENCOUNTER — Ambulatory Visit: Payer: Medicare PPO | Admitting: Hematology and Oncology

## 2021-10-21 DIAGNOSIS — R928 Other abnormal and inconclusive findings on diagnostic imaging of breast: Secondary | ICD-10-CM | POA: Diagnosis not present

## 2021-10-22 NOTE — Assessment & Plan Note (Signed)
11/21/2018:Screening detected left breast mammogram revealing calcifications: Biopsy revealed low-grade DCIS ER 100%, PR 100%Tis NX stage 0  Treatment plan: Comet clinical trial participation(active monitoring arm)  Breast cancer surveillance: Mammogram at [facility]8/04/2021:Stable appearance of the left breast of previous biopsy-proven DCIS. (Suspicious mass left breast.  Stereotactic biopsy 04/30/2021: Fibrocystic change with calcifications) Breast exam 06/23/2021: Benign   Current treatment: Patient decided not to take tamoxifen therapy.  Return to clinic in6 months for follow-upwith another mammogram

## 2021-10-23 ENCOUNTER — Inpatient Hospital Stay: Payer: Medicare PPO | Admitting: Emergency Medicine

## 2021-10-23 ENCOUNTER — Other Ambulatory Visit: Payer: Self-pay

## 2021-10-23 ENCOUNTER — Inpatient Hospital Stay: Payer: Medicare PPO

## 2021-10-23 ENCOUNTER — Inpatient Hospital Stay: Payer: Medicare PPO | Attending: Hematology and Oncology | Admitting: Hematology and Oncology

## 2021-10-23 DIAGNOSIS — D0512 Intraductal carcinoma in situ of left breast: Secondary | ICD-10-CM

## 2021-10-23 DIAGNOSIS — Z006 Encounter for examination for normal comparison and control in clinical research program: Secondary | ICD-10-CM | POA: Diagnosis not present

## 2021-10-23 LAB — RESEARCH LABS

## 2021-10-23 NOTE — Research (Signed)
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   ACTIVE MONITORING ARM.   PROs (QUESTIONNAIRES): Patient states she completes her questionnaires online but has not received them yet. Patient advised that there will be some delay in receiving her questionnaires but to keep an eye out for them.     LABS: Protocol required labs drawn today, patient has consented to optional specimen collection.  Tolerated well per pt.   MEDICATION REVIEW: Patient reviews and verifies the current medication list is correct.  Patient is not on endocrine therapy.   MAMMOGRAM:             Bilateral mammogram performed on 10/21/21.  Stable/unchanged results since last exam.  MD Gudena reviewed all results today.  Next mammogram (unilateral) due in February 2024, pt aware.  Order has been put in by provider.   MD/PROVIDER VISIT: Patient sees MD Gudena for today's visit.  See provider's notes for more information.   SOLICITED ADVERSE EVENTS: ALL GRADED USING CTCAE V. 4.0 PER PROTOCOL Grade 0 AE's include: allergic reaction, arthralgia, fever, hot flashes, myalgia, nausea, and fracture. Osteoporosis, high cholesterol, acute coronary syndrome, and cerebrovascular ischemia not evaluated. Ongoing unchanged baseline conditions: hypertension, alopecia, multiple stiff joints (decreased ROM), and bilateral knee pain. Ongoing unchanged other AE's: trigger fingers and left breast soreness.             Patient does not report any other AE's at this time.   ADVERSE EVENT LOGCybill Uriegas [id number] PID# [id number]   [id number]   Adverse Event Log   Study/Protocol: COMET Cycle: 36 month visit   Event Grade Onset Date Resolved Date Drug Name Attribution Treatment Comments  Hypertension Grade 1 12/12/2019 Ongoing None N/A None    Alopecia  Grade 1 Baseline Ongoing None N/A None    Multiple stiff joints (decreased ROM) Grade 1 Baseline Ongoing None N/A None    Pain bilateral knees Grade 1 Baseline Ongoing None N/A None    Trigger fingers (Musculoskeletal and connective tissue- other) Grade 1 ~07/2020 Ongoing None N/A None    Left breast soreness Grade 1 ~09/2020 Ongoing None N/A None        DISPOSITION: Upon completion off all study requirements, patient was escorted to exit with belongings.  Will contact patient in regards to scheduling her next appts.  Patient will be seen by MD Pamelia Hoit after next mammogram which is due in February 2024.   The patient was thanked for their time and continued voluntary participation in this study. Patient has been provided direct contact information and is encouraged to contact this Nurse for any needs or questions.  Lurena Joiner 'Warden FillersDairl Ponder, RN, BSN Clinical Research Nurse I 10/23/21 11:04 AM

## 2021-10-26 ENCOUNTER — Telehealth (INDEPENDENT_AMBULATORY_CARE_PROVIDER_SITE_OTHER): Payer: Medicare PPO | Admitting: Psychology

## 2021-10-26 DIAGNOSIS — F5089 Other specified eating disorder: Secondary | ICD-10-CM | POA: Diagnosis not present

## 2021-10-27 ENCOUNTER — Telehealth: Payer: Self-pay | Admitting: Emergency Medicine

## 2021-10-27 NOTE — Telephone Encounter (Signed)
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  Fax machine in [place] office out of order, requested it be sent to Research office fax.  Received fax.  Lurena Joiner 'Warden FillersDairl Ponder, RN, BSN Clinical Research Nurse I 10/27/21 10:41 AM

## 2021-10-28 ENCOUNTER — Encounter (INDEPENDENT_AMBULATORY_CARE_PROVIDER_SITE_OTHER): Payer: Self-pay

## 2021-11-10 ENCOUNTER — Encounter (INDEPENDENT_AMBULATORY_CARE_PROVIDER_SITE_OTHER): Payer: Self-pay | Admitting: Family Medicine

## 2021-11-10 ENCOUNTER — Ambulatory Visit (INDEPENDENT_AMBULATORY_CARE_PROVIDER_SITE_OTHER): Payer: Medicare PPO | Admitting: Family Medicine

## 2021-11-10 VITALS — BP 125/76 | HR 63 | Temp 98.1°F | Ht 66.0 in | Wt 205.0 lb

## 2021-11-10 DIAGNOSIS — Z6833 Body mass index (BMI) 33.0-33.9, adult: Secondary | ICD-10-CM

## 2021-11-10 DIAGNOSIS — E669 Obesity, unspecified: Secondary | ICD-10-CM

## 2021-11-10 DIAGNOSIS — E559 Vitamin D deficiency, unspecified: Secondary | ICD-10-CM | POA: Diagnosis not present

## 2021-11-10 DIAGNOSIS — F439 Reaction to severe stress, unspecified: Secondary | ICD-10-CM | POA: Diagnosis not present

## 2021-11-18 NOTE — Progress Notes (Unsigned)
Chief Complaint:   OBESITY Lindsey [name] is here to discuss her progress with her obesity treatment plan along with follow-up of her obesity related diagnoses. Lindsey [name] is on keeping a food journal and adhering to recommended goals of 1500-1800 calories and 100+ grams of protein daily and states she is following her eating plan approximately 60% of the time. Lindsey [name] states she is walking for 30-40 minutes 5 times per week.  Today's visit was #: 42 Starting weight: 325 lbs Starting date: 05/25/2017 Today's weight: 206 lbs Today's date: 11/10/2021 Total lbs lost to date: 119 Total lbs lost since last in-office visit: 0  Interim History: Lindsey [name] has struggled more with following her plan. She is working on meeting her protein, but she is struggling with meal planning as she has been extra busy and stressed recently.   Subjective:   1. Stress Lindsey [name] has increased family and financial stressors, which has made meal planning difficult.   2. Vitamin D deficiency Lindsey [name]'s last Vitamin D level was at goal. She is due for labs soon.   Assessment/Plan:   1. Stress Emotional eating strategies were discussed to help Lindsey [name] with her stress.   2. Vitamin D deficiency Lindsey [name] will continue Vitamin D OTC 5,000 IU daily.   3. Obesity, Current BMI 33.4 Lindsey [name] is currently in the action stage of change. As such, her goal is to continue with weight loss efforts. She has agreed to keeping a food journal and adhering to recommended goals of 1500-1800 calories and 100+ grams of protein daily.   Exercise goals: As is.   Behavioral modification strategies: increasing lean protein intake and dealing with family or coworker sabotage.  Lindsey [name] has agreed to follow-up with our clinic in 4 weeks. She was informed of the importance of frequent follow-up visits to maximize her success with intensive lifestyle modifications for her multiple health conditions.   Objective:   Blood pressure 125/76, pulse 63, temperature 98.1 F (36.7  C), height 5\' 6"  (1.676 m), weight 205 lb (93 kg), SpO2 97 %. Body mass index is 33.09 kg/m.  General: Cooperative, alert, well developed, in no acute distress. HEENT: Conjunctivae and lids unremarkable. Cardiovascular: Regular rhythm.  Lungs: Normal work of breathing. Neurologic: No focal deficits.   Lab Results  Component Value Date   CREATININE 0.65 08/20/2020   BUN 22 08/20/2020   NA 145 (H) 08/20/2020   K 4.4 08/20/2020   CL 104 08/20/2020   CO2 24 08/20/2020   Lab Results  Component Value Date   ALT 14 08/20/2020   AST 19 08/20/2020   ALKPHOS 78 08/20/2020   BILITOT 0.6 08/20/2020   Lab Results  Component Value Date   HGBA1C 5.2 08/20/2020   HGBA1C 5.2 03/26/2019   HGBA1C 5.4 03/09/2018   HGBA1C 5.5 10/12/2017   HGBA1C 5.7 (H) 05/25/2017   Lab Results  Component Value Date   INSULIN 7.3 08/20/2020   INSULIN 4.4 03/26/2019   INSULIN 10.0 03/09/2018   INSULIN 19.1 10/12/2017   INSULIN 44.2 (H) 05/25/2017   Lab Results  Component Value Date   TSH 2.88 09/19/2020   Lab Results  Component Value Date   CHOL 178 09/24/2020   HDL 76 (A) 09/24/2020   LDLCALC 92 09/24/2020   TRIG 50 09/19/2020   Lab Results  Component Value Date   VD25OH 69.5 08/20/2020   VD25OH 64.7 06/26/2019   VD25OH 56.6 03/26/2019   Lab Results  Component Value Date   WBC 6.0 05/25/2017   HGB  13.6 05/25/2017   HCT 40.7 05/25/2017   MCV 91 05/25/2017   Lab Results  Component Value Date   FERRITIN 102.0 09/19/2020   Attestation Statements:   Reviewed by clinician on day of visit: allergies, medications, problem list, medical history, surgical history, family history, social history, and previous encounter notes.  Time spent on visit including pre-visit chart review and post-visit care and charting was 46 minutes.   I, Burt Knack, am acting as transcriptionist for Quillian Quince, MD.  I have reviewed the above documentation for accuracy and completeness, and I agree with  the above. -  Quillian Quince, MD

## 2021-12-10 ENCOUNTER — Ambulatory Visit (INDEPENDENT_AMBULATORY_CARE_PROVIDER_SITE_OTHER): Payer: Medicare PPO | Admitting: Family Medicine

## 2021-12-28 ENCOUNTER — Ambulatory Visit (INDEPENDENT_AMBULATORY_CARE_PROVIDER_SITE_OTHER): Payer: Medicare PPO | Admitting: Family Medicine

## 2021-12-29 DIAGNOSIS — M170 Bilateral primary osteoarthritis of knee: Secondary | ICD-10-CM | POA: Diagnosis not present

## 2021-12-29 DIAGNOSIS — M25562 Pain in left knee: Secondary | ICD-10-CM | POA: Diagnosis not present

## 2021-12-29 DIAGNOSIS — M1712 Unilateral primary osteoarthritis, left knee: Secondary | ICD-10-CM | POA: Diagnosis not present

## 2021-12-29 DIAGNOSIS — M25561 Pain in right knee: Secondary | ICD-10-CM | POA: Diagnosis not present

## 2021-12-29 DIAGNOSIS — M1711 Unilateral primary osteoarthritis, right knee: Secondary | ICD-10-CM | POA: Diagnosis not present

## 2021-12-30 DIAGNOSIS — H52203 Unspecified astigmatism, bilateral: Secondary | ICD-10-CM | POA: Diagnosis not present

## 2021-12-30 DIAGNOSIS — H2513 Age-related nuclear cataract, bilateral: Secondary | ICD-10-CM | POA: Diagnosis not present

## 2021-12-30 DIAGNOSIS — H5213 Myopia, bilateral: Secondary | ICD-10-CM | POA: Diagnosis not present

## 2021-12-31 ENCOUNTER — Encounter (INDEPENDENT_AMBULATORY_CARE_PROVIDER_SITE_OTHER): Payer: Self-pay | Admitting: Family Medicine

## 2021-12-31 ENCOUNTER — Other Ambulatory Visit (INDEPENDENT_AMBULATORY_CARE_PROVIDER_SITE_OTHER): Payer: Self-pay | Admitting: Family Medicine

## 2021-12-31 ENCOUNTER — Ambulatory Visit (INDEPENDENT_AMBULATORY_CARE_PROVIDER_SITE_OTHER): Payer: Medicare PPO | Admitting: Family Medicine

## 2021-12-31 VITALS — BP 110/71 | HR 62 | Temp 98.3°F | Ht 66.0 in | Wt 208.0 lb

## 2021-12-31 DIAGNOSIS — E559 Vitamin D deficiency, unspecified: Secondary | ICD-10-CM

## 2021-12-31 DIAGNOSIS — F4329 Adjustment disorder with other symptoms: Secondary | ICD-10-CM

## 2021-12-31 DIAGNOSIS — Z6833 Body mass index (BMI) 33.0-33.9, adult: Secondary | ICD-10-CM

## 2021-12-31 DIAGNOSIS — E669 Obesity, unspecified: Secondary | ICD-10-CM

## 2021-12-31 DIAGNOSIS — F4320 Adjustment disorder, unspecified: Secondary | ICD-10-CM | POA: Insufficient documentation

## 2021-12-31 MED ORDER — TOPIRAMATE 50 MG PO TABS: 25.0000 mg | ORAL_TABLET | Freq: Every day | ORAL | 0 refills | Status: DC

## 2022-01-05 NOTE — Progress Notes (Signed)
Chief Complaint:   OBESITY Lindsey [name] is here to discuss her progress with her obesity treatment plan along with follow-up of her obesity related diagnoses. Lindsey [name] is on keeping a food journal and adhering to recommended goals of 1500-1800 calories and 100+ grams of protein daily and states she is following her eating plan approximately 60% of the time. Lindsey [name] states she is walking for 30-40 minutes 5 times per week.  Today's visit was #: 14 Starting weight: 325 lbs Starting date: 05/25/2017 Today's weight: 208 lbs Today's date: 12/31/2021 Total lbs lost to date: 117 Total lbs lost since last in-office visit: 0  Interim History: Lindsey [name]'s last visit was approximately 2 months ago. She has had increased stress with her mother moving into long term care. She hasn't been able to concentrate on her eating plan as much.   Subjective:   1. Vitamin D deficiency Lindsey [name] is on Vitamin D OTC 5,000 IU daily, with no side effects noted.   2. Adjustment disorder with emotional eating behavior Lindsey [name] is dealing with a lot of changes and she is feeling a bit overwhelmed. She hasn't been able to concentrate on her eating plan as much.   Assessment/Plan:   1. Vitamin D deficiency Lindsey [name] will continue OTC Vitamin D, and we will recheck labs in 1-2 months.   2. Adjustment disorder with emotional eating behavior Lindsey [name] agreed to start Topamax 50 mg qhs with no refills. She will work on getting into a routine to help her not feel as overwhelmed.   - topiramate (TOPAMAX) 50 MG tablet; Take 0.5 tablets (25 mg total) by mouth daily.  Dispense: 30 tablet; Refill: 0  3. Obesity, Current BMI 33.7 Lindsey [name] is currently in the action stage of change. As such, her goal is to continue with weight loss efforts. She has agreed to keeping a food journal and adhering to recommended goals of 1500-1800 calories and 100+ grams of protein daily.   Exercise goals: As is.   Behavioral modification strategies: increasing lean protein  intake.  Lindsey [name] has agreed to follow-up with our clinic in 4 weeks. She was informed of the importance of frequent follow-up visits to maximize her success with intensive lifestyle modifications for her multiple health conditions.   Objective:   Blood pressure 110/71, pulse 62, temperature 98.3 F (36.8 C), height 5\' 6"  (1.676 m), weight 208 lb (94.3 kg), SpO2 98 %. Body mass index is 33.57 kg/m.  General: Cooperative, alert, well developed, in no acute distress. HEENT: Conjunctivae and lids unremarkable. Cardiovascular: Regular rhythm.  Lungs: Normal work of breathing. Neurologic: No focal deficits.   Lab Results  Component Value Date   CREATININE 0.65 08/20/2020   BUN 22 08/20/2020   NA 145 (H) 08/20/2020   K 4.4 08/20/2020   CL 104 08/20/2020   CO2 24 08/20/2020   Lab Results  Component Value Date   ALT 14 08/20/2020   AST 19 08/20/2020   ALKPHOS 78 08/20/2020   BILITOT 0.6 08/20/2020   Lab Results  Component Value Date   HGBA1C 5.2 08/20/2020   HGBA1C 5.2 03/26/2019   HGBA1C 5.4 03/09/2018   HGBA1C 5.5 10/12/2017   HGBA1C 5.7 (H) 05/25/2017   Lab Results  Component Value Date   INSULIN 7.3 08/20/2020   INSULIN 4.4 03/26/2019   INSULIN 10.0 03/09/2018   INSULIN 19.1 10/12/2017   INSULIN 44.2 (H) 05/25/2017   Lab Results  Component Value Date   TSH 2.88 09/19/2020   Lab Results  Component Value  Date   CHOL 178 09/24/2020   HDL 76 (A) 09/24/2020   LDLCALC 92 09/24/2020   TRIG 50 09/19/2020   Lab Results  Component Value Date   VD25OH 69.5 08/20/2020   VD25OH 64.7 06/26/2019   VD25OH 56.6 03/26/2019   Lab Results  Component Value Date   WBC 6.0 05/25/2017   HGB 13.6 05/25/2017   HCT 40.7 05/25/2017   MCV 91 05/25/2017   Lab Results  Component Value Date   FERRITIN 102.0 09/19/2020   Attestation Statements:   Reviewed by clinician on day of visit: allergies, medications, problem list, medical history, surgical history, family history, social  history, and previous encounter notes.   I, Trixie Dredge, am acting as transcriptionist for Dennard Nip, MD.  I have reviewed the above documentation for accuracy and completeness, and I agree with the above. -  Dennard Nip, MD

## 2022-01-06 DIAGNOSIS — Z136 Encounter for screening for cardiovascular disorders: Secondary | ICD-10-CM | POA: Diagnosis not present

## 2022-01-06 DIAGNOSIS — I4510 Unspecified right bundle-branch block: Secondary | ICD-10-CM | POA: Diagnosis not present

## 2022-01-06 DIAGNOSIS — Z13220 Encounter for screening for lipoid disorders: Secondary | ICD-10-CM | POA: Diagnosis not present

## 2022-01-06 DIAGNOSIS — R7303 Prediabetes: Secondary | ICD-10-CM | POA: Diagnosis not present

## 2022-01-06 DIAGNOSIS — D0510 Intraductal carcinoma in situ of unspecified breast: Secondary | ICD-10-CM | POA: Diagnosis not present

## 2022-01-06 DIAGNOSIS — Z131 Encounter for screening for diabetes mellitus: Secondary | ICD-10-CM | POA: Diagnosis not present

## 2022-01-06 DIAGNOSIS — Z1211 Encounter for screening for malignant neoplasm of colon: Secondary | ICD-10-CM | POA: Diagnosis not present

## 2022-01-06 DIAGNOSIS — Z23 Encounter for immunization: Secondary | ICD-10-CM | POA: Diagnosis not present

## 2022-01-06 DIAGNOSIS — Z0000 Encounter for general adult medical examination without abnormal findings: Secondary | ICD-10-CM | POA: Diagnosis not present

## 2022-01-08 DIAGNOSIS — Z1211 Encounter for screening for malignant neoplasm of colon: Secondary | ICD-10-CM | POA: Diagnosis not present

## 2022-01-20 DIAGNOSIS — M19012 Primary osteoarthritis, left shoulder: Secondary | ICD-10-CM | POA: Diagnosis not present

## 2022-02-01 ENCOUNTER — Encounter (INDEPENDENT_AMBULATORY_CARE_PROVIDER_SITE_OTHER): Payer: Self-pay | Admitting: Family Medicine

## 2022-02-01 ENCOUNTER — Ambulatory Visit (INDEPENDENT_AMBULATORY_CARE_PROVIDER_SITE_OTHER): Payer: Medicare PPO | Admitting: Family Medicine

## 2022-02-01 VITALS — BP 121/64 | HR 73 | Temp 98.2°F | Ht 66.0 in | Wt 212.0 lb

## 2022-02-01 DIAGNOSIS — Z6834 Body mass index (BMI) 34.0-34.9, adult: Secondary | ICD-10-CM

## 2022-02-01 DIAGNOSIS — E669 Obesity, unspecified: Secondary | ICD-10-CM

## 2022-02-01 DIAGNOSIS — F439 Reaction to severe stress, unspecified: Secondary | ICD-10-CM

## 2022-02-15 NOTE — Progress Notes (Signed)
Chief Complaint:   OBESITY Lindsey [name] is here to discuss her progress with her obesity treatment plan along with follow-up of her obesity related diagnoses. Lindsey [name] is on keeping a food journal and adhering to recommended goals of 1500-1800 calories and 100+ grams of protein and states she is following her eating plan approximately 50% of the time. Lindsey [name] states she is walking for 30 minutes 6 times per week.  Today's visit was #: 44 Starting weight: 325 lbs Starting date: 06/04/2017 Today's weight: 212 lbs Today's date: 02/01/2022 Total lbs lost to date: 113 Total lbs lost since last in-office visit: 0  Interim History: Lindsey [name] continues to struggle with weight gain. She is not able to journal as strictly and she has done some eating out.   Subjective:   1. Stress Lindsey [name] had restarted topiramate but she didn't feel it helped, and she noted dysgeusia, xerostomia, and constipation. She continues to work on decreasing stress and decreasing emotional eating behaviors.   Assessment/Plan:   1. Stress Lindsey [name] agreed to discontinue topiramate. She will increase her walking and will continue to be mindful of her triggers for emotional eating behaviors.   2. Obesity, Current BMI 34.3 Lindsey [name] is currently in the action stage of change. As such, her goal is to continue with weight loss efforts. She has agreed to keeping a food journal and adhering to recommended goals of 1500-1800 calories and 100+ grams of protein daily.   Exercise goals: As is.   Behavioral modification strategies: increasing lean protein intake and meal planning and cooking strategies.  Lindsey [name] has agreed to follow-up with our clinic in 3 to 4 weeks. She was informed of the importance of frequent follow-up visits to maximize her success with intensive lifestyle modifications for her multiple health conditions.   Objective:   Blood pressure 121/64, pulse 73, temperature 98.2 F (36.8 C), height 5\' 6"  (1.676 m), weight 212 lb (96.2 kg),  SpO2 97 %. Body mass index is 34.22 kg/m.  General: Cooperative, alert, well developed, in no acute distress. HEENT: Conjunctivae and lids unremarkable. Cardiovascular: Regular rhythm.  Lungs: Normal work of breathing. Neurologic: No focal deficits.   Lab Results  Component Value Date   CREATININE 0.65 08/20/2020   BUN 22 08/20/2020   NA 145 (H) 08/20/2020   K 4.4 08/20/2020   CL 104 08/20/2020   CO2 24 08/20/2020   Lab Results  Component Value Date   ALT 14 08/20/2020   AST 19 08/20/2020   ALKPHOS 78 08/20/2020   BILITOT 0.6 08/20/2020   Lab Results  Component Value Date   HGBA1C 5.2 08/20/2020   HGBA1C 5.2 03/26/2019   HGBA1C 5.4 03/09/2018   HGBA1C 5.5 10/12/2017   HGBA1C 5.7 (H) 05/25/2017   Lab Results  Component Value Date   INSULIN 7.3 08/20/2020   INSULIN 4.4 03/26/2019   INSULIN 10.0 03/09/2018   INSULIN 19.1 10/12/2017   INSULIN 44.2 (H) 05/25/2017   Lab Results  Component Value Date   TSH 2.88 09/19/2020   Lab Results  Component Value Date   CHOL 178 09/24/2020   HDL 76 (A) 09/24/2020   LDLCALC 92 09/24/2020   TRIG 50 09/19/2020   Lab Results  Component Value Date   VD25OH 69.5 08/20/2020   VD25OH 64.7 06/26/2019   VD25OH 56.6 03/26/2019   Lab Results  Component Value Date   WBC 6.0 05/25/2017   HGB 13.6 05/25/2017   HCT 40.7 05/25/2017   MCV 91 05/25/2017   Lab Results  Component Value Date   FERRITIN 102.0 09/19/2020   Attestation Statements:   Reviewed by clinician on day of visit: allergies, medications, problem list, medical history, surgical history, family history, social history, and previous encounter notes.  Time spent on visit including pre-visit chart review and post-visit care and charting was 30 minutes.   I, Burt Knack, am acting as transcriptionist for Quillian Quince, MD.  I have reviewed the above documentation for accuracy and completeness, and I agree with the above. -  Quillian Quince, MD

## 2022-02-16 DIAGNOSIS — H00015 Hordeolum externum left lower eyelid: Secondary | ICD-10-CM | POA: Diagnosis not present

## 2022-02-16 DIAGNOSIS — H01005 Unspecified blepharitis left lower eyelid: Secondary | ICD-10-CM | POA: Diagnosis not present

## 2022-02-22 ENCOUNTER — Encounter: Payer: Self-pay | Admitting: Cardiology

## 2022-02-22 ENCOUNTER — Ambulatory Visit: Payer: Medicare PPO | Attending: Cardiology | Admitting: Cardiology

## 2022-02-22 VITALS — BP 128/78 | HR 83 | Ht 66.0 in | Wt 221.0 lb

## 2022-02-22 DIAGNOSIS — E669 Obesity, unspecified: Secondary | ICD-10-CM | POA: Diagnosis not present

## 2022-02-22 DIAGNOSIS — R079 Chest pain, unspecified: Secondary | ICD-10-CM

## 2022-02-22 DIAGNOSIS — R011 Cardiac murmur, unspecified: Secondary | ICD-10-CM | POA: Diagnosis not present

## 2022-02-22 MED ORDER — METOPROLOL TARTRATE 100 MG PO TABS: 100.0000 mg | ORAL_TABLET | Freq: Once | ORAL | 0 refills | Status: DC

## 2022-02-22 NOTE — Progress Notes (Signed)
Cardiology Office Note:    Date:  02/23/2022   ID:  Lindsey [name], DOB 05/05/1955, MRN [id number]  PCP:  No primary care provider on file.  Cardiologist:  Thomasene RippleKardie Kaydie Petsch, DO  Electrophysiologist:  None   Referring MD: Aliene BeamsHagler, Rachel, MD   " I was send by pcp for chest pain and heart murmur"  History of Present Illness:    Lindsey [name] is a 66 y.o. female with a hx of obesity was send by her pcp for chest pain and murmur.  At the start of her visit when I entered the room she says to me " I want this visit to be a one and done" I am not a heart patient.  I shared a summary of the review of her chart noting that there has been suspicion that she may have a murmur and that her PCP has documented that she has had some chest pain. She proceeded to tell me that she has had  She tells me today that she has had intemittent chest discomfort.  Which she described as intermittent chest discomfort.  She describes it as a left upper chest pain which comes and goes.  Sometimes it radiates up close to the shoulder.  Nothing makes it better or worse.   Past Medical History:  Diagnosis Date   Dry skin    H/O seasonal allergies    Hair loss    Knee pain    Multiple stiff joints    Nasal congestion    Skin tag    Snoring    Urgency of urination     Past Surgical History:  Procedure Laterality Date   FOOT SURGERY     TONSILLECTOMY      Current Medications: Current Meds  Medication Sig   BIOTIN PO Take 1 each by mouth daily.   Cholecalciferol (VITAMIN D3) 125 MCG (5000 UT) CAPS Take 1 capsule by mouth daily.   fluticasone (FLONASE) 50 MCG/ACT nasal spray Place 1 spray into both nostrils daily.   metoprolol tartrate (LOPRESSOR) 100 MG tablet Take 1 tablet (100 mg total) by mouth once for 1 dose.   Multiple Vitamins-Minerals (WOMENS MULTIVITAMIN PLUS PO) Take 1 tablet by mouth daily.     Allergies:   Lactose intolerance (gi), Sulfa antibiotics, and Amoxicillin   Social History    Socioeconomic History   Marital status: Single    Spouse name: Not on file   Number of children: 0   Years of education: Not on file   Highest education level: Not on file  Occupational History   Occupation: [employer]  Tobacco Use   Smoking status: Former    Packs/day: 1.00    Years: 15.00    Total pack years: 15.00    Types: Cigarettes   Smokeless tobacco: Never   Tobacco comments:    quit 10 years ago  Substance and Sexual Activity   Alcohol use: Yes    Comment: 1-2 drinks a month or less   Drug use: No   Sexual activity: Not on file  Other Topics Concern   Not on file  Social History Narrative   Not on file   Social Determinants of Health   Financial Resource Strain: Not on file  Food Insecurity: Not on file  Transportation Needs: Not on file  Physical Activity: Not on file  Stress: Not on file  Social Connections: Not on file     Family History: The patient's family history includes Cancer in her  father; Depression in her mother; Diabetes in her father; Heart disease in her father; High blood pressure in her mother; Obesity in her father and mother; Sleep apnea in her father.  ROS:   Review of Systems  Constitution: Negative for decreased appetite, fever and weight gain.  HENT: Negative for congestion, ear discharge, hoarse voice and sore throat.   Eyes: Negative for discharge, redness, vision loss in right eye and visual halos.  Cardiovascular: Reports chest pain.  Negative for dyspnea on exertion, leg swelling, orthopnea and palpitations.  Respiratory: Negative for cough, hemoptysis, shortness of breath and snoring.   Endocrine: Negative for heat intolerance and polyphagia.  Hematologic/Lymphatic: Negative for bleeding problem. Does not bruise/bleed easily.  Skin: Negative for flushing, nail changes, rash and suspicious lesions.  Musculoskeletal: Negative for arthritis, joint pain, muscle cramps, myalgias, neck pain and stiffness.   Gastrointestinal: Negative for abdominal pain, bowel incontinence, diarrhea and excessive appetite.  Genitourinary: Negative for decreased libido, genital sores and incomplete emptying.  Neurological: Negative for brief paralysis, focal weakness, headaches and loss of balance.  Psychiatric/Behavioral: Negative for altered mental status, depression and suicidal ideas.  Allergic/Immunologic: Negative for HIV exposure and persistent infections.    EKGs/Labs/Other Studies Reviewed:    The following studies were reviewed today:   EKG:  The ekg ordered today demonstrates   Recent Labs: 02/22/2022: BUN 19; Creatinine, Ser 0.62; Potassium 4.9; Sodium 144  Recent Lipid Panel    Component Value Date/Time   CHOL 178 09/24/2020 0000   CHOL 159 03/26/2019 1140   TRIG 50 09/19/2020 0000   HDL 76 (A) 09/24/2020 0000   HDL 66 03/26/2019 1140   LDLCALC 92 09/24/2020 0000   LDLCALC 81 03/26/2019 1140    Physical Exam:    VS:  BP 128/78   Pulse 83   Ht 5\' 6"  (1.676 m)   Wt 221 lb (100.2 kg)   SpO2 96%   BMI 35.67 kg/m     Wt Readings from Last 3 Encounters:  02/22/22 221 lb (100.2 kg)  02/01/22 212 lb (96.2 kg)  12/31/21 208 lb (94.3 kg)     GEN: Well nourished, well developed in no acute distress HEENT: Normal NECK: No JVD; No carotid bruits LYMPHATICS: No lymphadenopathy CARDIAC: S1S2 noted,RRR, 2/6 midsystolic ejection murmur murmurs, rubs, gallops RESPIRATORY:  Clear to auscultation without rales, wheezing or rhonchi  ABDOMEN: Soft, non-tender, non-distended, +bowel sounds, no guarding. EXTREMITIES: No edema, No cyanosis, no clubbing MUSCULOSKELETAL:  No deformity  SKIN: Warm and dry NEUROLOGIC:  Alert and oriented x 3, non-focal PSYCHIATRIC:  Normal affect, good insight  ASSESSMENT:    1. Murmur   2. Chest pain of uncertain etiology   3. Obesity (BMI 30-39.9)    PLAN:     The symptoms chest pain is concerning, this patient does have intermediate risk for coronary  artery disease and at this time I would like to pursue an ischemic evaluation in this patient.  Shared decision a coronary CTA at this time is appropriate.  I have discussed with the patient about the testing.  The patient has no IV contrast allergy and is agreeable to proceed with this test.  With her murmur for completeness we will get an echocardiogram suspect likely aortic stenosis or sclerosis but would like to quantify this.  The patient understands the need to lose weight with diet and exercise. We have discussed specific strategies for this.  She asked about clearance for her upcoming surgery I discussed with the patient it  would be best for her to get the testing and we can be able to proceed from there.  At this time we will hold off on clearing the patient until she get her coronary CTA.  She expresses understanding about this.  Plan unfortunately she was very upset during this time and said to me that she wished she had not come to see cardiologist because this plan just get in the way.  Explained to the patient the importance of her work-up given the fact that she is symptomatic  The patient is in agreement with the above plan. The patient left the office in stable condition.  The patient will follow up in   Medication Adjustments/Labs and Tests Ordered: Current medicines are reviewed at length with the patient today.  Concerns regarding medicines are outlined above.  Orders Placed This Encounter  Procedures   CT CORONARY MORPH W/CTA COR W/SCORE W/CA W/CM &/OR WO/CM   Basic Metabolic Panel (BMET)   ECHOCARDIOGRAM COMPLETE   Meds ordered this encounter  Medications   metoprolol tartrate (LOPRESSOR) 100 MG tablet    Sig: Take 1 tablet (100 mg total) by mouth once for 1 dose.    Dispense:  1 tablet    Refill:  0    Patient Instructions  Medication Instructions:  Your physician recommends that you continue on your current medications as directed. Please refer to the Current  Medication list given to you today.    *If you need a refill on your cardiac medications before your next appointment, please call your pharmacy*   Lab Work: Your physician recommends that you return for lab work in: TODAY   If you have labs (blood work) drawn today and your tests are completely normal, you will receive your results only by: MyChart Message (if you have MyChart) OR A paper copy in the mail If you have any lab test that is abnormal or we need to change your treatment, we will call you to review the results.   Testing/Procedures: Your physician has requested that you have an echocardiogram. Echocardiography is a painless test that uses sound waves to create images of your heart. It provides your doctor with information about the size and shape of your heart and how well your heart's chambers and valves are working. This procedure takes approximately one hour. There are no restrictions for this procedure. Please do NOT wear cologne, perfume, aftershave, or lotions (deodorant is allowed). Please arrive 15 minutes prior to your appointment time.    Follow-Up: At [facility], you and your health needs are our priority.  As part of our continuing mission to provide you with exceptional heart care, we have created designated Provider Care Teams.  These Care Teams include your primary Cardiologist (physician) and Advanced Practice Providers (APPs -  Physician Assistants and Nurse Practitioners) who all work together to provide you with the care you need, when you need it.  We recommend signing up for the patient portal called "MyChart".  Sign up information is provided on this After Visit Summary.  MyChart is used to connect with patients for Virtual Visits (Telemedicine).  Patients are able to view lab/test results, encounter notes, upcoming appointments, etc.  Non-urgent messages can be sent to your provider as well.   To learn more about what you can do with MyChart,  go to [email or web address].    Your next appointment:   4 month(s)  The format for your next appointment:   In Person  Provider:  Thomasene Ripple, DO     Other Instructions   Important Information About Sugar        Your cardiac CT will be scheduled at one of the below locations:   [facility] [address] [place], [place] [address] [phone]  OR  [facility] [address] [place], [place] [address] [phone]  OR   [facility] [address] [place], [place] [address] [phone]  If scheduled at [facility], please arrive at the [facility] and Children's Entrance (Entrance C2) of [facility] 30 minutes prior to test start time. You can use the FREE valet parking offered at entrance C (encouraged to control the heart rate for the test)  Proceed to the [facility] Radiology Department (first floor) to check-in and test prep.  All radiology patients and guests should use entrance C2 at [facility], accessed from [facility], even though the hospital's physical address listed is [address]    If scheduled at [facility] or [facility], please arrive 15 mins early for check-in and test prep.   Please follow these instructions carefully (unless otherwise directed):  Hold all erectile dysfunction medications at least 3 days (72 hrs) prior to test. (Ie viagra, cialis, sildenafil, tadalafil, etc) We will administer nitroglycerin during this exam.   On the Night Before the Test: Be sure to Drink plenty of water. Do not consume any caffeinated/decaffeinated beverages or chocolate 12 hours prior to your test. Do not take any antihistamines 12 hours prior to your test.   On the Day of the Test: Drink plenty of water until 1 hour prior to the test. Do not eat any food 1 hour prior  to test. You may take your regular medications prior to the test.  Take metoprolol (Lopressor) two hours prior to test  Tablet by mouth 2 hours before test HOLD Furosemide/Hydrochlorothiazide morning of the test. FEMALES- please wear underwire-free bra if available, avoid dresses & tight clothing       After the Test: Drink plenty of water. After receiving IV contrast, you may experience a mild flushed feeling. This is normal. On occasion, you may experience a mild rash up to 24 hours after the test. This is not dangerous. If this occurs, you can take Benadryl 25 mg and increase your fluid intake. If you experience trouble breathing, this can be serious. If it is severe call 911 IMMEDIATELY. If it is mild, please call our office. If you take any of these medications: Glipizide/Metformin, Avandament, Glucavance, please do not take 48 hours after completing test unless otherwise instructed.  We will call to schedule your test 2-4 weeks out understanding that some insurance companies will need an authorization prior to the service being performed.   For non-scheduling related questions, please contact the cardiac imaging nurse navigator should you have any questions/concerns: [place], Cardiac Imaging Nurse Navigator Larey Brick, Cardiac Imaging Nurse Navigator [facility] Direct Office Dial: [phone]   For scheduling needs, including cancellations and rescheduling, please call [place], [phone].    Adopting a Healthy Lifestyle.  Know what a healthy weight is for you (roughly BMI <25) and aim to maintain this   Aim for 7+ servings of fruits and vegetables daily   65-80+ fluid ounces of water or unsweet tea for healthy kidneys   Limit to max 1 drink of alcohol per day; avoid  smoking/tobacco   Limit animal fats in diet for cholesterol and heart health - choose grass fed whenever available   Avoid highly processed foods, and foods high in  saturated/trans fats   Aim for low stress - take time to unwind and care for your mental health   Aim for 150 min of moderate intensity exercise weekly for heart health, and weights twice weekly for bone health   Aim for 7-9 hours of sleep daily   When it comes to diets, agreement about the perfect plan isnt easy to find, even among the experts. Experts at the [facility] [employer] developed an idea known as the Healthy Eating Plate. Just imagine a plate divided into logical, healthy portions.   The emphasis is on diet quality:   Load up on vegetables and fruits - one-half of your plate: Aim for color and variety, and remember that potatoes dont count.   Go for whole grains - one-quarter of your plate: Whole wheat, barley, wheat berries, quinoa, oats, brown rice, and foods made with them. If you want pasta, go with whole wheat pasta.   Protein power - one-quarter of your plate: Fish, chicken, beans, and nuts are all healthy, versatile protein sources. Limit red meat.   The diet, however, does go beyond the plate, offering a few other suggestions.   Use healthy plant oils, such as olive, canola, soy, corn, sunflower and peanut. Check the labels, and avoid partially hydrogenated oil, which have unhealthy trans fats.   If youre thirsty, drink water. Coffee and tea are good in moderation, but skip sugary drinks and limit milk and dairy products to one or two daily servings.   The type of carbohydrate in the diet is more important than the amount. Some sources of carbohydrates, such as vegetables, fruits, whole grains, and beans-are healthier than others.   Finally, stay active  Signed, Thomasene Ripple, DO  02/23/2022 4:32 PM    [facility]

## 2022-02-22 NOTE — Patient Instructions (Signed)
Medication Instructions:  Your physician recommends that you continue on your current medications as directed. Please refer to the Current Medication list given to you today.    *If you need a refill on your cardiac medications before your next appointment, please call your pharmacy*   Lab Work: Your physician recommends that you return for lab work in: TODAY   If you have labs (blood work) drawn today and your tests are completely normal, you will receive your results only by: MyChart Message (if you have MyChart) OR A paper copy in the mail If you have any lab test that is abnormal or we need to change your treatment, we will call you to review the results.   Testing/Procedures: Your physician has requested that you have an echocardiogram. Echocardiography is a painless test that uses sound waves to create images of your heart. It provides your doctor with information about the size and shape of your heart and how well your heart's chambers and valves are working. This procedure takes approximately one hour. There are no restrictions for this procedure. Please do NOT wear cologne, perfume, aftershave, or lotions (deodorant is allowed). Please arrive 15 minutes prior to your appointment time.    Follow-Up: At [facility], you and your health needs are our priority.  As part of our continuing mission to provide you with exceptional heart care, we have created designated Provider Care Teams.  These Care Teams include your primary Cardiologist (physician) and Advanced Practice Providers (APPs -  Physician Assistants and Nurse Practitioners) who all work together to provide you with the care you need, when you need it.  We recommend signing up for the patient portal called "MyChart".  Sign up information is provided on this After Visit Summary.  MyChart is used to connect with patients for Virtual Visits (Telemedicine).  Patients are able to view lab/test results, encounter notes,  upcoming appointments, etc.  Non-urgent messages can be sent to your provider as well.   To learn more about what you can do with MyChart, go to [email or web address].    Your next appointment:   4 month(s)  The format for your next appointment:   In Person  Provider:   Thomasene Ripple, DO     Other Instructions   Important Information About Sugar        Your cardiac CT will be scheduled at one of the below locations:   [facility] [address] [place], [place] [address] [phone]  OR  [facility] [address] [place], [place] [address] [phone]  OR   [facility] [address] [place], [place] [address] [phone]  If scheduled at [facility], please arrive at the [facility] and Children's Entrance (Entrance C2) of [facility] 30 minutes prior to test start time. You can use the FREE valet parking offered at entrance C (encouraged to control the heart rate for the test)  Proceed to the [facility] Radiology Department (first floor) to check-in and test prep.  All radiology patients and guests should use entrance C2 at [facility], accessed from [facility], even though the hospital's physical address listed is [address]    If scheduled at [facility] or [facility], please arrive 15 mins early for check-in and test prep.   Please follow these instructions carefully (unless otherwise directed):  Hold all erectile dysfunction medications at least 3  days (72 hrs) prior to test. (Ie viagra, cialis, sildenafil, tadalafil, etc) We will administer nitroglycerin during this exam.   On the Night Before the Test: Be sure to Drink plenty of water. Do not consume any caffeinated/decaffeinated beverages or chocolate 12 hours prior to your test. Do not take any antihistamines 12  hours prior to your test.   On the Day of the Test: Drink plenty of water until 1 hour prior to the test. Do not eat any food 1 hour prior to test. You may take your regular medications prior to the test.  Take metoprolol (Lopressor) two hours prior to test 100mg  Tablet by mouth 2 hours before test HOLD Furosemide/Hydrochlorothiazide morning of the test. FEMALES- please wear underwire-free bra if available, avoid dresses & tight clothing       After the Test: Drink plenty of water. After receiving IV contrast, you may experience a mild flushed feeling. This is normal. On occasion, you may experience a mild rash up to 24 hours after the test. This is not dangerous. If this occurs, you can take Benadryl 25 mg and increase your fluid intake. If you experience trouble breathing, this can be serious. If it is severe call 911 IMMEDIATELY. If it is mild, please call our office. If you take any of these medications: Glipizide/Metformin, Avandament, Glucavance, please do not take 48 hours after completing test unless otherwise instructed.  We will call to schedule your test 2-4 weeks out understanding that some insurance companies will need an authorization prior to the service being performed.   For non-scheduling related questions, please contact the cardiac imaging nurse navigator should you have any questions/concerns: , Cardiac Imaging Nurse Navigator [place], Cardiac Imaging Nurse Navigator [facility] Direct Office Dial: [phone]   For scheduling needs, including cancellations and rescheduling, please call [phone], [phone].

## 2022-02-23 ENCOUNTER — Other Ambulatory Visit: Payer: Self-pay | Admitting: Cardiology

## 2022-02-23 LAB — BASIC METABOLIC PANEL
BUN/Creatinine Ratio: 31 — ABNORMAL HIGH (ref 12–28)
BUN: 19 mg/dL (ref 8–27)
CO2: 27 mmol/L (ref 20–29)
Calcium: 10 mg/dL (ref 8.7–10.3)
Chloride: 104 mmol/L (ref 96–106)
Creatinine, Ser: 0.62 mg/dL (ref 0.57–1.00)
Glucose: 96 mg/dL (ref 70–99)
Potassium: 4.9 mmol/L (ref 3.5–5.2)
Sodium: 144 mmol/L (ref 134–144)
eGFR: 99 mL/min/{1.73_m2} (ref >59)

## 2022-02-24 ENCOUNTER — Other Ambulatory Visit: Payer: Self-pay | Admitting: Cardiology

## 2022-02-25 ENCOUNTER — Other Ambulatory Visit: Payer: Self-pay | Admitting: Cardiology

## 2022-02-26 ENCOUNTER — Other Ambulatory Visit: Payer: Self-pay | Admitting: Cardiology

## 2022-02-27 ENCOUNTER — Other Ambulatory Visit (INDEPENDENT_AMBULATORY_CARE_PROVIDER_SITE_OTHER): Payer: Self-pay | Admitting: Family Medicine

## 2022-02-27 DIAGNOSIS — F4329 Adjustment disorder with other symptoms: Secondary | ICD-10-CM

## 2022-03-01 ENCOUNTER — Ambulatory Visit (INDEPENDENT_AMBULATORY_CARE_PROVIDER_SITE_OTHER): Payer: Medicare PPO | Admitting: Family Medicine

## 2022-03-04 ENCOUNTER — Telehealth: Payer: Self-pay | Admitting: Emergency Medicine

## 2022-03-04 NOTE — Telephone Encounter (Signed)
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  ROI on file.  Called to remind patient to complete questionnaire (36 month) via electronic link and to call back if she hadn't received it yet.  Lurena Joiner 'Warden FillersDairl Ponder, RN, BSN Clinical Research Nurse I 03/04/22 4:17 PM

## 2022-03-17 ENCOUNTER — Telehealth: Payer: Self-pay | Admitting: Emergency Medicine

## 2022-03-17 NOTE — Telephone Encounter (Signed)
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  Patient denies any questions/concerns at this time.  Lurena Joiner 'Warden FillersDairl Ponder, RN, BSN Clinical Research Nurse I 03/17/22 11:31 AM

## 2022-03-17 NOTE — Telephone Encounter (Signed)
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  ROI on file.  Called to remind patient to complete questionnaire (36 month) via electronic link and to call back if she hadn't received it yet.     Lurena Joiner 'Warden FillersDairl Ponder, RN, BSN Clinical Research Nurse I 03/17/22 11:06 AM

## 2022-03-18 ENCOUNTER — Ambulatory Visit (HOSPITAL_COMMUNITY): Payer: Medicare PPO | Attending: Cardiology

## 2022-03-18 DIAGNOSIS — R079 Chest pain, unspecified: Secondary | ICD-10-CM | POA: Diagnosis not present

## 2022-03-18 DIAGNOSIS — R011 Cardiac murmur, unspecified: Secondary | ICD-10-CM

## 2022-03-18 LAB — ECHOCARDIOGRAM COMPLETE
Area-P 1/2: 3.96 cm2
S' Lateral: 2.6 cm

## 2022-03-23 ENCOUNTER — Encounter: Payer: Self-pay | Admitting: Cardiology

## 2022-03-30 ENCOUNTER — Telehealth (HOSPITAL_COMMUNITY): Payer: Self-pay | Admitting: *Deleted

## 2022-03-30 DIAGNOSIS — L650 Telogen effluvium: Secondary | ICD-10-CM | POA: Diagnosis not present

## 2022-03-30 DIAGNOSIS — L649 Androgenic alopecia, unspecified: Secondary | ICD-10-CM | POA: Diagnosis not present

## 2022-03-30 DIAGNOSIS — L821 Other seborrheic keratosis: Secondary | ICD-10-CM | POA: Diagnosis not present

## 2022-03-30 IMAGING — MR MR PELVIS WO/W CM
8 of 10 series · 34 of 48 positions shown · IV contrast (multihance)
Comparison: 09/27/2019 pelvic sonogram.

CLINICAL DATA: 64-year-old female with indeterminate solid right
adnexal mass on recent pelvic sonogram performed for pelvic pain.

EXAM:
MRI PELVIS WITHOUT AND WITH CONTRAST
TECHNIQUE: Multiplanar multisequence MR imaging of the pelvis was performed
both before and after administration of intravenous contrast.
CONTRAST:  17mL MULTIHANCE GADOBENATE DIMEGLUMINE 529 MG/ML IV SOLN

[Series 2: T2 · coronal · 5.0mm · 1.25mm/px · 2 of 30 slices shown (1 of 3)]
[im 1/30]
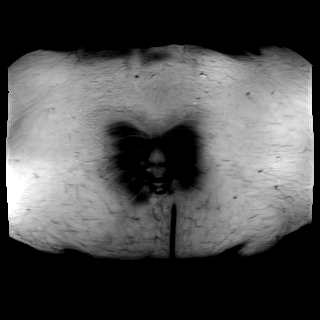
[im 30/30]
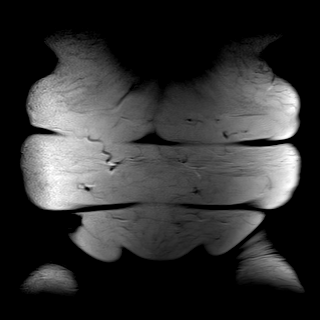

[Series 3: T2 · sagittal · 5.0mm · 0.78mm/px · 1 of 25 slices shown (2 of 3)]
[im 1/25]
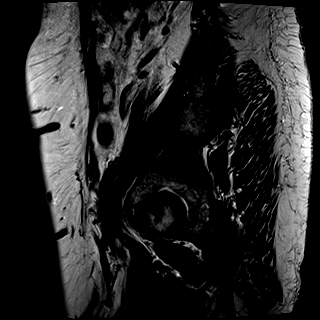

[Series 4: T2 · axial · 5.0mm · 0.41mm/px · z∈[-20,+154]mm · 2 of 30 slices shown (3 of 3)]
[im 1/30]
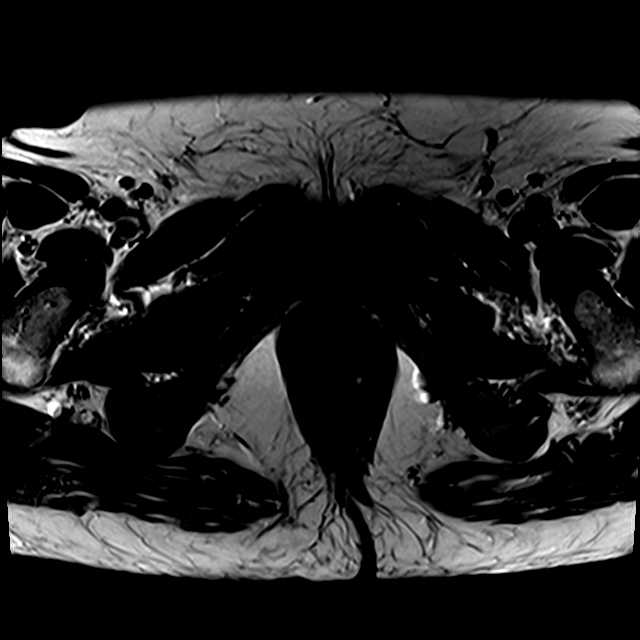
[im 30/30]
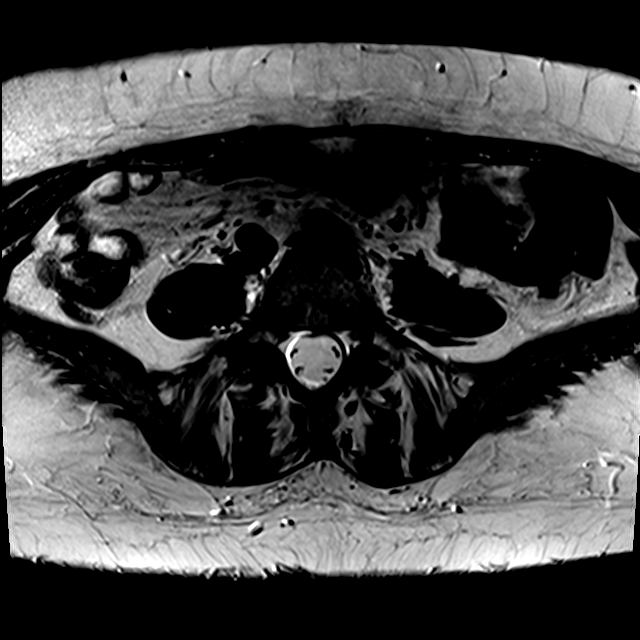

[Series 5: T2 fat-sat · axial · 5.0mm · 0.41mm/px · z∈[-20,+154]mm · 2 of 30 slices shown]
[im 1/30]
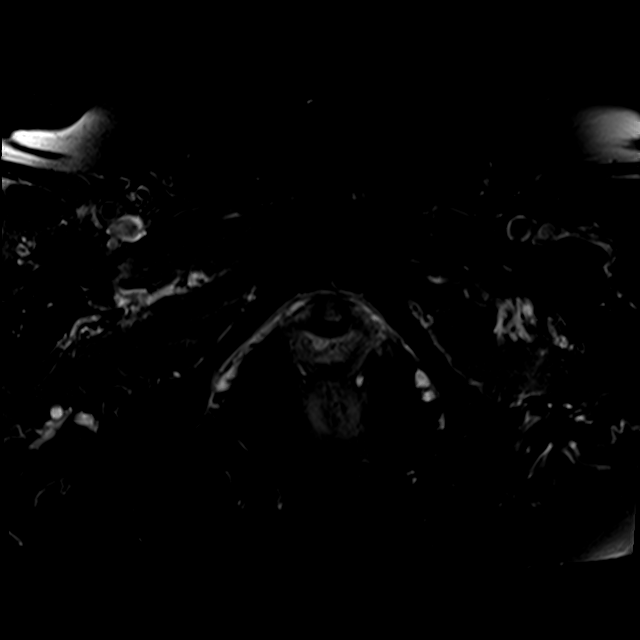
[im 30/30]
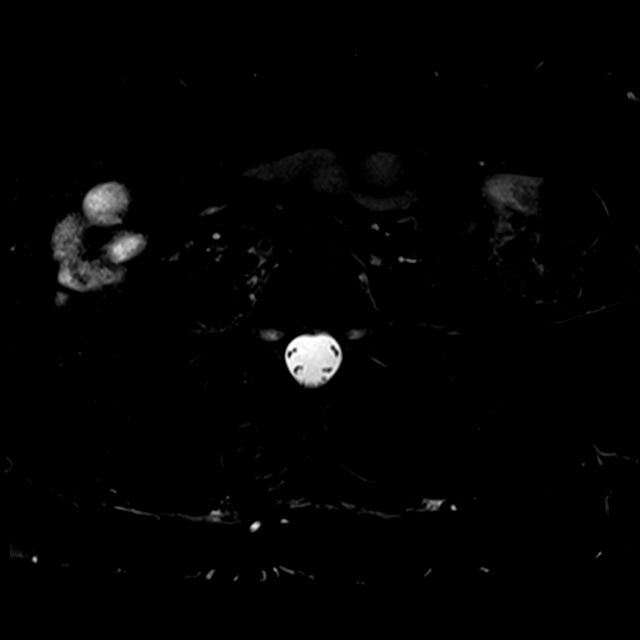

[Series 6: T1 · axial · 1.2mm · 0.75mm/px · z∈[-19,+153]mm · 8 of 144 slices shown]
[im 1/144]
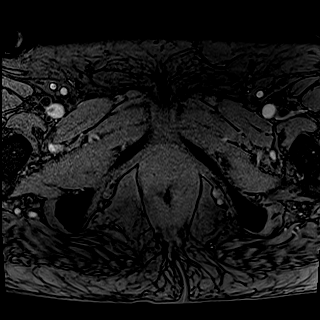
[im 21/144]
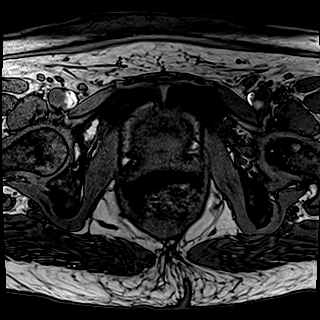
[im 41/144]
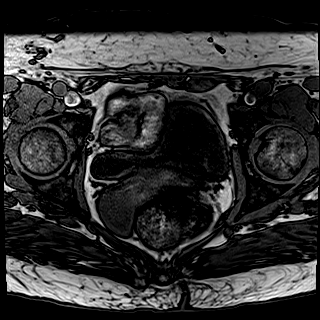
[im 62/144]
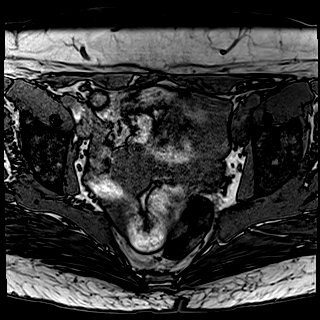
[im 82/144]
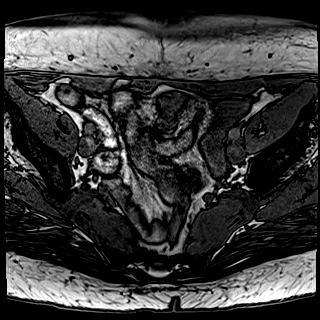
[im 103/144]
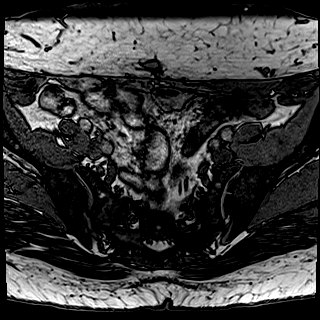
[im 123/144]
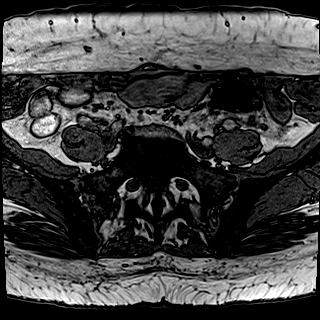
[im 144/144]
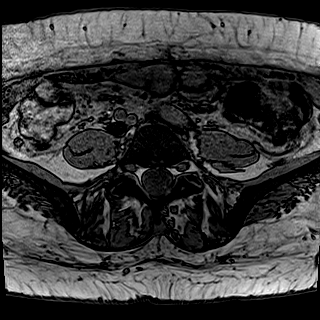

[Series 7: T1 fat-sat · axial · 1.2mm · 0.75mm/px · z∈[-19,+153]mm · 8 of 144 slices shown]
[im 1/144]
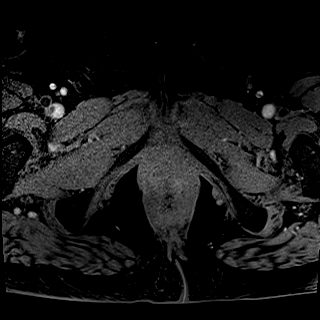
[im 21/144]
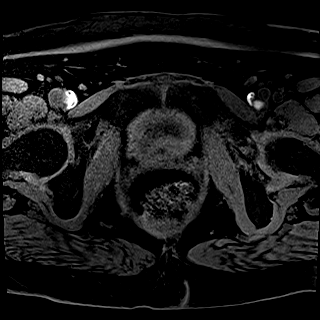
[im 41/144]
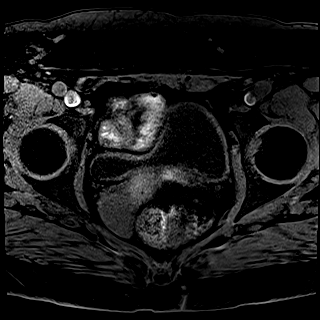
[im 62/144]
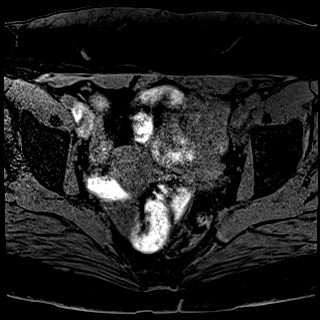
[im 82/144]
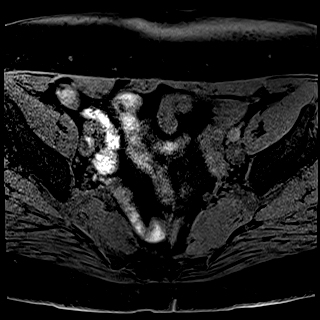
[im 103/144]
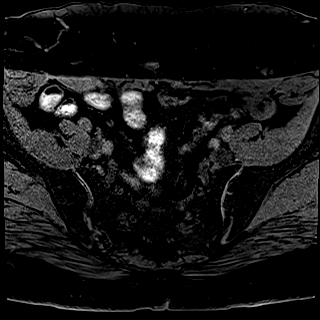
[im 123/144]
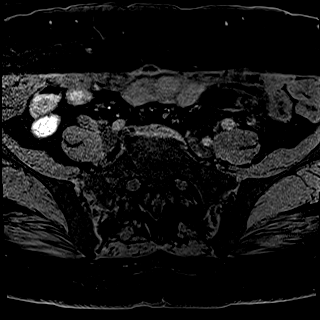
[im 144/144]
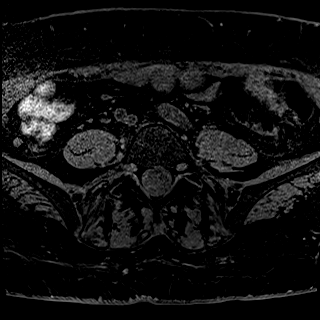

[Series 8: T1 fat-sat post-contrast · axial · 1.2mm · 0.75mm/px · z∈[-19,+153]mm · 8 of 144 slices shown (1 of 2)]
[im 1/144]
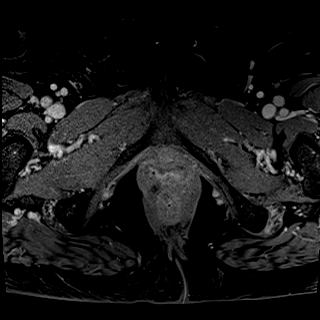
[im 21/144]
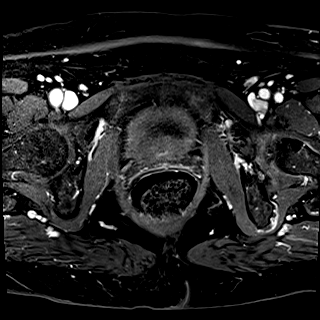
[im 41/144]
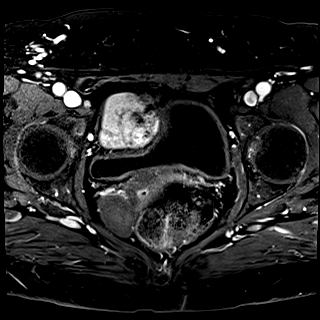
[im 62/144]
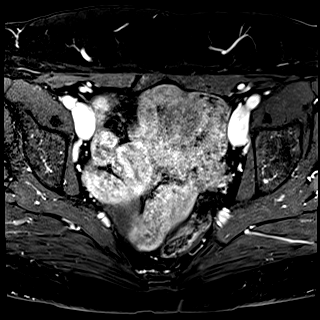
[im 82/144]
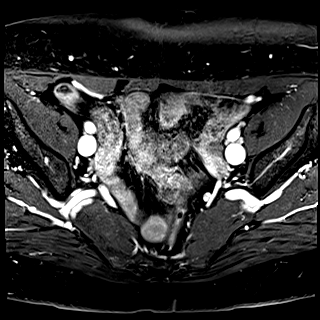
[im 103/144]
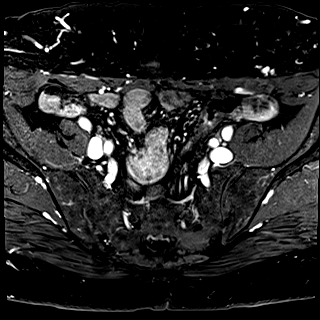
[im 123/144]
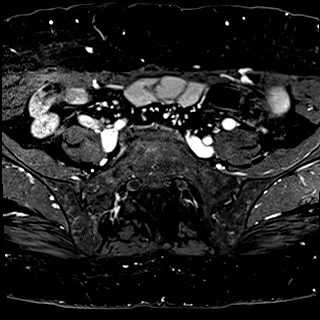
[im 144/144]
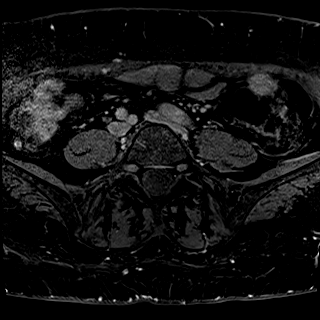

[Series 9: T1 fat-sat post-contrast · axial · 1.2mm · 0.75mm/px · z∈[-19,+29]mm · 3 of 144 slices shown (2 of 2)]
[im 1/144]
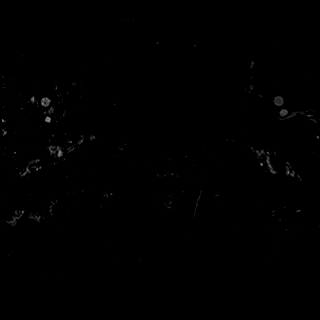
[im 21/144]
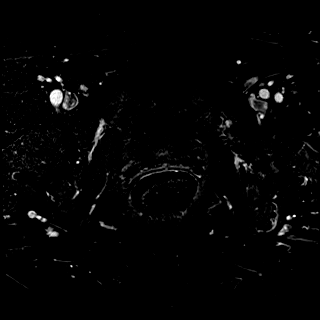
[im 41/144]
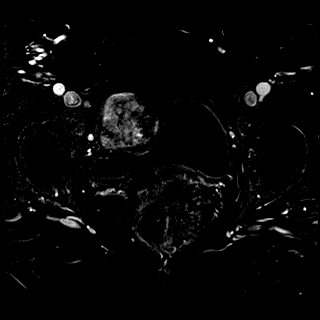

[34 of 48 positions shown; findings below may reference images not displayed]

FINDINGS: Urinary Tract:  Normal bladder.  Normal urethra.

Bowel: Visualized small and large bowel are normal caliber with no
bowel wall thickening. Marked left colonic diverticulosis.

Vascular/Lymphatic: No pathologically enlarged lymph nodes in the
pelvis. No acute vascular abnormality.

Reproductive:

Uterus: The anteverted uterus measures 5.6 x 2.9 x 3.8 cm. There is
a solid circumscribed mildly enhancing homogeneous 4.1 x 3.5 x
cm right pelvic mass along the right lateral margin of the uterine
cervix (series 4/image 20), which appears to demonstrate a stalk
containing bridging vessels arising from right uterine cervix
(series 8/image 94), most compatible with an exophytic cervical
fibroid. Endometrium measures 3 mm in bilayer thickness, which is
within normal postmenopausal limits. Known medial cavity fluid or
focal endometrial mass. There is a 1.4 x 1.2 cm nabothian cyst in
the left uterine cervix.

Ovaries and Adnexa: The ovaries appear atrophic bilaterally.
Otherwise no adnexal masses.

Other: No abnormal free fluid in the pelvis. No focal pelvic fluid
collection.

Musculoskeletal: No aggressive appearing focal osseous lesions. Mild
degenerative disc disease in the lower lumbar spine.
IMPRESSION: 1. Solid circumscribed homogeneous mildly enhancing 4.1 cm right
pelvic mass along the right lateral margin of the uterine cervix,
which appears to demonstrate a stalk containing bridging vessels
arising from the right uterine cervix, most compatible with an
exophytic cervical fibroid. Suggest initial pelvic sonographic
follow-up in 3-6 months.
2. No pelvic lymphadenopathy.
3. Marked left colonic diverticulosis.

## 2022-03-30 NOTE — Telephone Encounter (Signed)
Reaching out to patient to offer assistance regarding upcoming cardiac imaging study; pt verbalizes understanding of appt date/time, parking situation and where to check in, pre-test NPO status and medications ordered, and verified current allergies; name and call back number provided for further questions should they arise  Capri Veals RN Navigator Cardiac Imaging [facility] [phone] office [phone] cell  Patient to take 100mg metoprolol tartrate two hours prior to her cardiac CT scan. She is aware to arrive at 12:30pm. 

## 2022-03-31 ENCOUNTER — Encounter (HOSPITAL_COMMUNITY): Payer: Self-pay

## 2022-03-31 ENCOUNTER — Ambulatory Visit (HOSPITAL_COMMUNITY)
Admission: RE | Admit: 2022-03-31 | Discharge: 2022-03-31 | Disposition: A | Discharge status: Home / Self Care | Payer: Medicare PPO | Source: Ambulatory Visit | Attending: Cardiology | Admitting: Cardiology

## 2022-03-31 DIAGNOSIS — R079 Chest pain, unspecified: Secondary | ICD-10-CM

## 2022-03-31 DIAGNOSIS — Q2112 Patent foramen ovale: Secondary | ICD-10-CM | POA: Insufficient documentation

## 2022-03-31 DIAGNOSIS — R0609 Other forms of dyspnea: Secondary | ICD-10-CM | POA: Diagnosis not present

## 2022-03-31 DIAGNOSIS — R011 Cardiac murmur, unspecified: Secondary | ICD-10-CM

## 2022-03-31 MED ORDER — IOHEXOL 350 MG/ML SOLN
95.0000 mL | Freq: Once | INTRAVENOUS | Status: AC | PRN
  Administered 2022-03-31: 95 mL via INTRAVENOUS

## 2022-03-31 MED ORDER — NITROGLYCERIN 0.4 MG SL SUBL
0.8000 mg | SUBLINGUAL_TABLET | Freq: Once | SUBLINGUAL | Status: AC
  Administered 2022-03-31: 0.8 mg via SUBLINGUAL

## 2022-03-31 MED ORDER — NITROGLYCERIN 0.4 MG SL SUBL
SUBLINGUAL_TABLET | SUBLINGUAL | Status: AC
  Filled 2022-03-31: qty 2

## 2022-04-01 ENCOUNTER — Ambulatory Visit (INDEPENDENT_AMBULATORY_CARE_PROVIDER_SITE_OTHER): Payer: Medicare PPO | Admitting: Family Medicine

## 2022-04-01 ENCOUNTER — Encounter: Payer: Self-pay | Admitting: Cardiology

## 2022-04-06 NOTE — Telephone Encounter (Signed)
Called pt to set up appointment. She was thankful for he call. Pt added to schedule 1/31 at 11am.

## 2022-04-14 ENCOUNTER — Telehealth: Payer: Self-pay | Admitting: Emergency Medicine

## 2022-04-14 NOTE — Telephone Encounter (Signed)
AFT - 25: COMPARING AN OPERATION TO MONITORING, WITH OR WITHOUT ENDOCRINE THERAPY (COMET) FOR LOW RISK DCIS: A PHASE III PROSPECTIVE RANDOMIZED TRIAL  Called to confirm upcoming appts with patient.  Patient's mammogram scheduled for 04/28/22 and [place] visit on 05/03/22.  Will see patient during [place] visit.  Patient denies any questions or concerns at this time.  Patient is aware to follow up as needed and has this Research Nurse's contact information.  Wells Guiles '[name]' [name], RN, BSN, [facility] Clinical Research Nurse I 04/14/22 9:41 AM

## 2022-04-17 NOTE — Progress Notes (Signed)
Study was indicated for dyspnea on exertion

## 2022-04-21 ENCOUNTER — Encounter: Payer: Self-pay | Admitting: Cardiology

## 2022-04-21 ENCOUNTER — Ambulatory Visit: Payer: Medicare PPO | Attending: Cardiology | Admitting: Cardiology

## 2022-04-21 VITALS — BP 134/78 | HR 82 | Ht 66.0 in | Wt 221.0 lb

## 2022-04-21 DIAGNOSIS — E669 Obesity, unspecified: Secondary | ICD-10-CM | POA: Diagnosis not present

## 2022-04-21 DIAGNOSIS — Q2112 Patent foramen ovale: Secondary | ICD-10-CM

## 2022-04-21 NOTE — Patient Instructions (Signed)
Medication Instructions:  Your physician recommends that you continue on your current medications as directed. Please refer to the Current Medication list given to you today.  *If you need a refill on your cardiac medications before your next appointment, please call your pharmacy*   Lab Work: None   Testing/Procedures: None   Follow-Up: At [facility], you and your health needs are our priority.  As part of our continuing mission to provide you with exceptional heart care, we have created designated Provider Care Teams.  These Care Teams include your primary Cardiologist (physician) and Advanced Practice Providers (APPs -  Physician Assistants and Nurse Practitioners) who all work together to provide you with the care you need, when you need it.   Your next appointment:    As needed  Provider:   Kardie Tobb, DO   

## 2022-04-21 NOTE — Progress Notes (Signed)
Cardiology Office Note:    Date:  04/25/2022   ID:  Lindsey [name], DOB [birth year], MRN [id number]  PCP:  Pcp, No  Cardiologist:  Thomasene Ripple, DO  Electrophysiologist:  None   Referring MD: No ref. provider found   " I was send by pcp for chest pain and heart murmur"  History of Present Illness:    Lindsey [name] is a 67 y.o. female with a hx of obesity, recently seen PFO on her coronary CT scan.  She offers no complaints at this time.   Past Medical History:  Diagnosis Date   Dry skin    H/O seasonal allergies    Hair loss    Knee pain    Multiple stiff joints    Nasal congestion    Skin tag    Snoring    Urgency of urination     Past Surgical History:  Procedure Laterality Date   FOOT SURGERY     TONSILLECTOMY      Current Medications: Current Meds  Medication Sig   BIOTIN PO Take 1 each by mouth daily.   Cholecalciferol (VITAMIN D3) 125 MCG (5000 UT) CAPS Take 1 capsule by mouth daily.   diphenhydrAMINE (BENADRYL) 25 mg capsule Take 25 mg by mouth every 6 (six) hours as needed. Patient takes 1/2 tablet as needed   fluticasone (FLONASE) 50 MCG/ACT nasal spray Place 1 spray into both nostrils daily.   Multiple Vitamins-Minerals (WOMENS MULTIVITAMIN PLUS PO) Take 1 tablet by mouth daily.     Allergies:   Lactose intolerance (gi), Sulfa antibiotics, and Amoxicillin   Social History   Socioeconomic History   Marital status: Single    Spouse name: Not on file   Number of children: 0   Years of education: Not on file   Highest education level: Not on file  Occupational History   Occupation: [employer] manager  Tobacco Use   Smoking status: Former    Packs/day: 1.00    Years: 15.00    Total pack years: 15.00    Types: Cigarettes   Smokeless tobacco: Never   Tobacco comments:    quit 10 years ago  Substance and Sexual Activity   Alcohol use: Yes    Comment: 1-2 drinks a month or less   Drug use: No   Sexual activity: Not on file  Other Topics Concern    Not on file  Social History Narrative   Not on file   Social Determinants of Health   Financial Resource Strain: Not on file  Food Insecurity: Not on file  Transportation Needs: Not on file  Physical Activity: Not on file  Stress: Not on file  Social Connections: Not on file     Family History: The patient's family history includes Cancer in her father; Depression in her mother; Diabetes in her father; Heart disease in her father; High blood pressure in her mother; Obesity in her father and mother; Sleep apnea in her father.  ROS:   Review of Systems  Constitution: Negative for decreased appetite, fever and weight gain.  HENT: Negative for congestion, ear discharge, hoarse voice and sore throat.   Eyes: Negative for discharge, redness, vision loss in right eye and visual halos.  Cardiovascular: Reports chest pain.  Negative for dyspnea on exertion, leg swelling, orthopnea and palpitations.  Respiratory: Negative for cough, hemoptysis, shortness of breath and snoring.   Endocrine: Negative for heat intolerance and polyphagia.  Hematologic/Lymphatic: Negative for bleeding problem. Does not bruise/bleed easily.  Skin: Negative for flushing, nail changes, rash and suspicious lesions.  Musculoskeletal: Negative for arthritis, joint pain, muscle cramps, myalgias, neck pain and stiffness.  Gastrointestinal: Negative for abdominal pain, bowel incontinence, diarrhea and excessive appetite.  Genitourinary: Negative for decreased libido, genital sores and incomplete emptying.  Neurological: Negative for brief paralysis, focal weakness, headaches and loss of balance.  Psychiatric/Behavioral: Negative for altered mental status, depression and suicidal ideas.  Allergic/Immunologic: Negative for HIV exposure and persistent infections.    EKGs/Labs/Other Studies Reviewed:    The following studies were reviewed today:   EKG:  The ekg ordered today demonstrates sinus rhythm, heart rate 82  beats minute.  CCTA 03/31/2022  FINDINGS: Image quality: Excellent.   Noise artifact is: Limited.   Coronary Arteries:  Normal coronary origin.  Right dominance.   Left main: The left main is a large caliber vessel with a normal take off from the left coronary cusp that bifurcates to form a left anterior descending artery and a left circumflex artery. There is no plaque or stenosis.   Left anterior descending artery: The LAD is patent without evidence of plaque or stenosis. The LAD gives off 2 patent diagonal branches.   Left circumflex artery: The LCX is non-dominant and patent with no evidence of plaque or stenosis. The LCX gives off 2 patent obtuse marginal branches.   Right coronary artery: The RCA is dominant with normal take off from the right coronary cusp. There is no evidence of plaque or stenosis. The RCA terminates as a PDA and right posterolateral branch without evidence of plaque or stenosis.   Right Atrium: Right atrial size is within normal limits.   Right Ventricle: The right ventricular cavity is within normal limits.   Left Atrium: Left atrial size is normal in size with no left atrial appendage filling defect. Small PFO.   Left Ventricle: The ventricular cavity size is within normal limits.   Pulmonary arteries: Normal in size without proximal filling defect.   Pulmonary veins: Normal pulmonary venous drainage.   Pericardium: Normal thickness without significant effusion or calcium present.   Cardiac valves: The aortic valve is trileaflet without significant calcification. The mitral valve is normal without significant calcification.   Aorta: Normal caliber without significant disease.   Extra-cardiac findings: See attached radiology report for non-cardiac structures.   IMPRESSION: 1. Coronary calcium score of 0.   2. Normal coronary origin with right dominance.   3. Normal coronary arteries.   4. Small PFO.   RECOMMENDATIONS: 1. No  evidence of CAD (0%). Consider non-atherosclerotic causes of chest pain.   [name], MD   TTE 03/18/2022 IMPRESSIONS   1. Left ventricular ejection fraction, by estimation, is 60 to 65%. The  left ventricle has normal function. The left ventricle has no regional  wall motion abnormalities. Left ventricular diastolic parameters were  normal.   2. Right ventricular systolic function is normal. The right ventricular  size is mildly enlarged. There is normal pulmonary artery systolic  pressure. The estimated right ventricular systolic pressure is 42.3 mmHg.   3. Left atrial size was mild to moderately dilated.   4. The mitral valve is grossly normal. Trivial mitral valve  regurgitation.   5. The aortic valve is tricuspid. Aortic valve regurgitation is not  visualized. Aortic valve sclerosis is present, with no evidence of aortic  valve stenosis.   6. The inferior vena cava is dilated in size with >50% respiratory  variability, suggesting right atrial pressure of 8 mmHg.  Comparison(s): No prior Echocardiogram.   FINDINGS   Left Ventricle: Left ventricular ejection fraction, by estimation, is 60  to 65%. The left ventricle has normal function. The left ventricle has no  regional wall motion abnormalities. 3D left ventricular ejection fraction  analysis performed but not  reported based on interpreter judgement due to suboptimal tracking. The  left ventricular internal cavity size was normal in size. There is no left  ventricular hypertrophy. Left ventricular diastolic parameters were  normal.   Right Ventricle: The right ventricular size is mildly enlarged. No  increase in right ventricular wall thickness. Right ventricular systolic  function is normal. There is normal pulmonary artery systolic pressure.  The tricuspid regurgitant velocity is 2.47   m/s, and with an assumed right atrial pressure of 8 mmHg, the estimated  right ventricular systolic pressure is 62.2 mmHg.    Left Atrium: Left atrial size was mild to moderately dilated.   Right Atrium: Right atrial size was normal in size.   Pericardium: There is no evidence of pericardial effusion.   Mitral Valve: The mitral valve is grossly normal. There is mild thickening  of the mitral valve leaflet(s). Trivial mitral valve regurgitation.   Tricuspid Valve: The tricuspid valve is normal in structure. Tricuspid  valve regurgitation is trivial.   Aortic Valve: The aortic valve is tricuspid. Aortic valve regurgitation is  not visualized. Aortic valve sclerosis is present, with no evidence of  aortic valve stenosis.   Pulmonic Valve: The pulmonic valve was normal in structure. Pulmonic valve  regurgitation is trivial.   Aorta: The aortic root and ascending aorta are structurally normal, with  no evidence of dilitation.   Venous: The inferior vena cava is dilated in size with greater than 50%  respiratory variability, suggesting right atrial pressure of 8 mmHg.   IAS/Shunts: The atrial septum is grossly normal.     LEFT VENTRICLE    Recent Labs: 02/22/2022: BUN 19; Creatinine, Ser 0.62; Potassium 4.9; Sodium 144  Recent Lipid Panel    Component Value Date/Time   CHOL 178 09/24/2020 0000   CHOL 159 03/26/2019 1140   TRIG 50 09/19/2020 0000   HDL 76 (A) 09/24/2020 0000   HDL 66 03/26/2019 1140   LDLCALC 92 09/24/2020 0000   LDLCALC 81 03/26/2019 1140    Physical Exam:    VS:  BP 134/78 (BP Location: Left Arm, Patient Position: Sitting, Cuff Size: Large)   Pulse 82   Ht 5\' 6"  (1.676 m)   Wt 100.2 kg Comment: Patient refused to weigh wanted to use 12/23 weight  SpO2 96%   BMI 35.67 kg/m     Wt Readings from Last 3 Encounters:  04/21/22 100.2 kg  02/22/22 100.2 kg  02/01/22 96.2 kg     GEN: Well nourished, well developed in no acute distress HEENT: Normal NECK: No JVD; No carotid bruits LYMPHATICS: No lymphadenopathy CARDIAC: S1S2 noted,RRR, 2/6 midsystolic ejection murmur  murmurs, rubs, gallops RESPIRATORY:  Clear to auscultation without rales, wheezing or rhonchi  ABDOMEN: Soft, non-tender, non-distended, +bowel sounds, no guarding. EXTREMITIES: No edema, No cyanosis, no clubbing MUSCULOSKELETAL:  No deformity  SKIN: Warm and dry NEUROLOGIC:  Alert and oriented x 3, non-focal PSYCHIATRIC:  Normal affect, good insight  ASSESSMENT:    1. PFO (patent foramen ovale)   2. Obesity (BMI 30-39.9)     PLAN:     Talked about her testing result.  There is evidence of mildly enlarged RV.  I suspect that she does have  sleep apnea-she admits to slowing as well.  But at this time she has declined any further testing for her sleep apnea.  Discussed all of her testing result.  She had questions about what a PFO was-explained to the patient that this has been present since birth.  The patient understands the need to lose weight with diet and exercise. We have discussed specific strategies for this.   The patient is in agreement with the above plan. The patient left the office in stable condition.  The patient will follow up in   Medication Adjustments/Labs and Tests Ordered: Current medicines are reviewed at length with the patient today.  Concerns regarding medicines are outlined above.  Orders Placed This Encounter  Procedures   EKG 12-Lead   No orders of the defined types were placed in this encounter.   Patient Instructions  Medication Instructions:  Your physician recommends that you continue on your current medications as directed. Please refer to the Current Medication list given to you today.  *If you need a refill on your cardiac medications before your next appointment, please call your pharmacy*   Lab Work: None   Testing/Procedures: None   Follow-Up: At [facility], you and your health needs are our priority.  As part of our continuing mission to provide you with exceptional heart care, we have created designated Provider Care  Teams.  These Care Teams include your primary Cardiologist (physician) and Advanced Practice Providers (APPs -  Physician Assistants and Nurse Practitioners) who all work together to provide you with the care you need, when you need it.  Your next appointment:   As needed  Provider:   Berniece Salines, DO        Adopting a Healthy Lifestyle.  Know what a healthy weight is for you (roughly BMI <25) and aim to maintain this   Aim for 7+ servings of fruits and vegetables daily   65-80+ fluid ounces of water or unsweet tea for healthy kidneys   Limit to max 1 drink of alcohol per day; avoid smoking/tobacco   Limit animal fats in diet for cholesterol and heart health - choose grass fed whenever available   Avoid highly processed foods, and foods high in saturated/trans fats   Aim for low stress - take time to unwind and care for your mental health   Aim for 150 min of moderate intensity exercise weekly for heart health, and weights twice weekly for bone health   Aim for 7-9 hours of sleep daily   When it comes to diets, agreement about the perfect plan isnt easy to find, even among the experts. Experts at the [employer] developed an idea known as the Healthy Eating Plate. Just imagine a plate divided into logical, healthy portions.   The emphasis is on diet quality:   Load up on vegetables and fruits - one-half of your plate: Aim for color and variety, and remember that potatoes dont count.   Go for whole grains - one-quarter of your plate: Whole wheat, barley, wheat berries, quinoa, oats, brown rice, and foods made with them. If you want pasta, go with whole wheat pasta.   Protein power - one-quarter of your plate: Fish, chicken, beans, and nuts are all healthy, versatile protein sources. Limit red meat.   The diet, however, does go beyond the plate, offering a few other suggestions.   Use healthy plant oils, such as olive, canola, soy, corn, sunflower and  peanut. Check the labels,  and avoid partially hydrogenated oil, which have unhealthy trans fats.   If youre thirsty, drink water. Coffee and tea are good in moderation, but skip sugary drinks and limit milk and dairy products to one or two daily servings.   The type of carbohydrate in the diet is more important than the amount. Some sources of carbohydrates, such as vegetables, fruits, whole grains, and beans-are healthier than others.   Finally, stay active  Signed, Thomasene Ripple, DO  04/25/2022 7:53 PM    [facility]

## 2022-04-28 DIAGNOSIS — R928 Other abnormal and inconclusive findings on diagnostic imaging of breast: Secondary | ICD-10-CM | POA: Diagnosis not present

## 2022-04-29 ENCOUNTER — Other Ambulatory Visit: Payer: Self-pay | Admitting: *Deleted

## 2022-04-29 ENCOUNTER — Encounter (INDEPENDENT_AMBULATORY_CARE_PROVIDER_SITE_OTHER): Payer: Self-pay | Admitting: Family Medicine

## 2022-04-29 ENCOUNTER — Ambulatory Visit (INDEPENDENT_AMBULATORY_CARE_PROVIDER_SITE_OTHER): Payer: Medicare PPO | Admitting: Family Medicine

## 2022-04-29 VITALS — BP 127/71 | HR 82 | Temp 98.5°F | Ht 66.0 in | Wt 230.0 lb

## 2022-04-29 DIAGNOSIS — F439 Reaction to severe stress, unspecified: Secondary | ICD-10-CM | POA: Diagnosis not present

## 2022-04-29 DIAGNOSIS — R7303 Prediabetes: Secondary | ICD-10-CM

## 2022-04-29 DIAGNOSIS — Z6837 Body mass index (BMI) 37.0-37.9, adult: Secondary | ICD-10-CM

## 2022-04-29 DIAGNOSIS — E669 Obesity, unspecified: Secondary | ICD-10-CM | POA: Diagnosis not present

## 2022-04-29 DIAGNOSIS — D0512 Intraductal carcinoma in situ of left breast: Secondary | ICD-10-CM

## 2022-04-30 NOTE — Progress Notes (Signed)
Patient Care Team: Pcp, No as PCP - General Berniece Salines, DO as PCP - Cardiology (Cardiology) Nicholas Lose, MD as Consulting Physician (Hematology and Oncology)  DIAGNOSIS: No diagnosis found.  SUMMARY OF ONCOLOGIC HISTORY: Oncology History  Ductal carcinoma in situ (DCIS) of left breast  11/21/2018 Initial Diagnosis   Routine screening mammogram detected calcifications in the left breast. Biopsy showed low-grade DCIS, ER+ 100%, PR+ 100%.      CHIEF COMPLIANT: Follow-up of DCIS on Comet clinical trial   INTERVAL HISTORY: Lindsey [name] is a 67 year old with above-mentioned history of low-grade DCIS who is currently on Comet clinical trial and is on surveillance.    ALLERGIES:  is allergic to lactose intolerance (gi), sulfa antibiotics, and amoxicillin.  MEDICATIONS:  Current Outpatient Medications  Medication Sig Dispense Refill   BIOTIN PO Take 1 each by mouth daily.     Cholecalciferol (VITAMIN D3) 125 MCG (5000 UT) CAPS Take 1 capsule by mouth daily.     diphenhydrAMINE (BENADRYL) 25 mg capsule Take 25 mg by mouth every 6 (six) hours as needed. Patient takes 1/2 tablet as needed     fluticasone (FLONASE) 50 MCG/ACT nasal spray Place 1 spray into both nostrils daily.  2   Multiple Vitamins-Minerals (WOMENS MULTIVITAMIN PLUS PO) Take 1 tablet by mouth daily.     No current facility-administered medications for this visit.    PHYSICAL EXAMINATION: ECOG PERFORMANCE STATUS: {CHL ONC ECOG PS:[id number]}  There were no vitals filed for this visit. There were no vitals filed for this visit.  BREAST:*** No palpable masses or nodules in either right or left breasts. No palpable axillary supraclavicular or infraclavicular adenopathy no breast tenderness or nipple discharge. (exam performed in the presence of a chaperone)  LABORATORY DATA:  I have reviewed the data as listed    Latest Ref Rng & Units 02/22/2022   12:12 PM 08/20/2020    1:25 PM 03/26/2019   11:40 AM  CMP   Glucose 70 - 99 mg/dL 96  84  81   BUN 8 - 27 mg/dL 19  22  17   $ Creatinine 0.57 - 1.00 mg/dL 0.62  0.65  0.57   Sodium 134 - 144 mmol/L 144  145  145   Potassium 3.5 - 5.2 mmol/L 4.9  4.4  4.1   Chloride 96 - 106 mmol/L 104  104  107   CO2 20 - 29 mmol/L 27  24  25   $ Calcium 8.7 - 10.3 mg/dL 10.0  9.9  9.7   Total Protein 6.0 - 8.5 g/dL  6.9  6.4   Total Bilirubin 0.0 - 1.2 mg/dL  0.6  1.1   Alkaline Phos 44 - 121 IU/L  78  68   AST 0 - 40 IU/L  19  17   ALT 0 - 32 IU/L  14  14     Lab Results  Component Value Date   WBC 6.0 05/25/2017   HGB 13.6 05/25/2017   HCT 40.7 05/25/2017   MCV 91 05/25/2017   NEUTROABS 3.3 05/25/2017    ASSESSMENT & PLAN:  No problem-specific Assessment & Plan notes found for this encounter.    No orders of the defined types were placed in this encounter.  The patient has a good understanding of the overall plan. she agrees with it. she will call with any problems that may develop before the next visit here. Total time spent: 30 mins including face to face time and time spent for planning,  charting and co-ordination of care   Suzzette Righter, [place] 04/30/22    I Gardiner Coins am acting as a Education administrator for [employer]  ***

## 2022-05-01 NOTE — Assessment & Plan Note (Signed)
11/21/2018: Screening detected left breast mammogram revealing calcifications: Biopsy revealed low-grade DCIS ER 100%, PR 100% Tis NX stage 0   Treatment plan: Comet clinical trial participation (active monitoring arm)   Breast cancer surveillance: Mammogram at [facility] 10/21/2021: Stable appearance of the left breast of previous biopsy-proven DCIS. (Suspicious mass left breast.  Stereotactic biopsy 04/30/2021: Fibrocystic change with calcifications) Breast exam 05/03/22: Benign     Current treatment: Patient decided not to take tamoxifen therapy.   Return to clinic in 1 year

## 2022-05-03 ENCOUNTER — Inpatient Hospital Stay: Payer: Medicare PPO | Admitting: Emergency Medicine

## 2022-05-03 ENCOUNTER — Other Ambulatory Visit: Payer: Self-pay

## 2022-05-03 ENCOUNTER — Inpatient Hospital Stay: Payer: Medicare PPO | Attending: Hematology and Oncology

## 2022-05-03 ENCOUNTER — Inpatient Hospital Stay (HOSPITAL_BASED_OUTPATIENT_CLINIC_OR_DEPARTMENT_OTHER): Payer: Medicare PPO | Admitting: Hematology and Oncology

## 2022-05-03 VITALS — BP 137/61 | HR 71 | Temp 97.5°F | Resp 19

## 2022-05-03 DIAGNOSIS — D0512 Intraductal carcinoma in situ of left breast: Secondary | ICD-10-CM

## 2022-05-03 DIAGNOSIS — Z006 Encounter for examination for normal comparison and control in clinical research program: Secondary | ICD-10-CM | POA: Diagnosis not present

## 2022-05-03 DIAGNOSIS — Z170 Estrogen receptor positive status [ER+]: Secondary | ICD-10-CM | POA: Insufficient documentation

## 2022-05-03 LAB — CBC WITH DIFFERENTIAL (CANCER CENTER ONLY)
Abs Immature Granulocytes: 0.01 10*3/uL (ref 0.00–0.07)
Basophils Absolute: 0 10*3/uL (ref 0.0–0.1)
Basophils Relative: 0 %
Eosinophils Absolute: 0.2 10*3/uL (ref 0.0–0.5)
Eosinophils Relative: 3 %
HCT: 36.3 % (ref 36.0–46.0)
Hemoglobin: 12.5 g/dL (ref 12.0–15.0)
Immature Granulocytes: 0 %
Lymphocytes Relative: 21 %
Lymphs Abs: 1.2 10*3/uL (ref 0.7–4.0)
MCH: 31 pg (ref 26.0–34.0)
MCHC: 34.4 g/dL (ref 30.0–36.0)
MCV: 90.1 fL (ref 80.0–100.0)
Monocytes Absolute: 0.6 10*3/uL (ref 0.1–1.0)
Monocytes Relative: 11 %
Neutro Abs: 3.6 10*3/uL (ref 1.7–7.7)
Neutrophils Relative %: 65 %
Platelet Count: 277 10*3/uL (ref 150–400)
RBC: 4.03 MIL/uL (ref 3.87–5.11)
RDW: 12.3 % (ref 11.5–15.5)
WBC Count: 5.6 10*3/uL (ref 4.0–10.5)
nRBC: 0 % (ref 0.0–0.2)

## 2022-05-03 LAB — CMP (CANCER CENTER ONLY)
ALT: 15 U/L (ref 0–44)
AST: 18 U/L (ref 15–41)
Albumin: 4.2 g/dL (ref 3.5–5.0)
Alkaline Phosphatase: 63 U/L (ref 38–126)
Anion gap: 5 (ref 5–15)
BUN: 19 mg/dL (ref 8–23)
CO2: 29 mmol/L (ref 22–32)
Calcium: 9.6 mg/dL (ref 8.9–10.3)
Chloride: 106 mmol/L (ref 98–111)
Creatinine: 0.63 mg/dL (ref 0.44–1.00)
GFR, Estimated: >60 mL/min (ref >60)
Glucose, Bld: 77 mg/dL (ref 70–99)
Potassium: 3.9 mmol/L (ref 3.5–5.1)
Sodium: 140 mmol/L (ref 135–145)
Total Bilirubin: 1.1 mg/dL (ref 0.3–1.2)
Total Protein: 6.9 g/dL (ref 6.5–8.1)

## 2022-05-03 NOTE — Research (Signed)
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   ACTIVE MONITORING ARM.   PROs (QUESTIONNAIRES): Not required at this time point.  Patient completed 36 month questionnaires.    LABS: Not required at this time point.   MEDICATION REVIEW: Patient reviews and verifies the current medication list is correct.  Patient is not on endocrine therapy.   MAMMOGRAM:             Bilateral mammogram performed on 04/28/2022.  Stable/unchanged results since last exam.  MD Gudena reviewed all results today.  Next mammogram (bilateral) due in August 2024, pt aware.   MD/PROVIDER VISIT: Patient sees MD Gudena for today's visit.  See provider's notes for more information.   SOLICITED ADVERSE EVENTS: ALL GRADED USING CTCAE V. 4.0 PER PROTOCOL Grade 0 AE's include: allergic reaction, arthralgia, fever, hot flashes, myalgia, nausea, and fracture. Osteoporosis, high cholesterol, acute coronary syndrome, and cerebrovascular ischemia not evaluated. Ongoing unchanged baseline conditions: hypertension, alopecia, multiple stiff joints (decreased ROM), and bilateral knee pain. Ongoing unchanged other AE's: trigger fingers and left breast soreness.             Patient does not report any other AE's at this time.  All AE's unrelated to study intervention per MD.   ADVERSE EVENT LOGElysse [name] [id number] PID# [id number]   05/03/2022   Adverse Event Log   Study/Protocol: COMET Cycle: 42 month visit   Event Grade Onset Date Resolved Date Drug Name Attribution Treatment Comments  Hypertension Grade 1 12/12/2019 Ongoing None N/A None    Alopecia Grade 1 Baseline Ongoing None N/A None    Multiple stiff joints (decreased ROM) Grade 1 Baseline Ongoing None N/A None    Pain bilateral knees Grade 1 Baseline Ongoing None N/A None     Trigger fingers (Musculoskeletal and connective tissue- other) Grade 1 ~07/2020 Ongoing None N/A None    Left breast soreness Grade 1 ~09/2020 Ongoing None N/A None        DISPOSITION: Upon completion off all study requirements, patient was escorted to exit with belongings.  Will contact patient in regards to scheduling her next appts.  Patient will be seen by MD Lindi Adie after next mammogram which is due in August 2024.   The patient was thanked for their time and continued voluntary participation in this study. Patient has been provided direct contact information and is encouraged to contact this Nurse for any needs or questions.  Lindsey Guiles '[name]' [name], RN, BSN, [facility] Clinical Research Nurse I 05/03/22 11:46 AM

## 2022-05-04 ENCOUNTER — Telehealth: Payer: Self-pay | Admitting: Hematology and Oncology

## 2022-05-04 ENCOUNTER — Encounter: Payer: Self-pay | Admitting: Hematology and Oncology

## 2022-05-04 NOTE — Telephone Encounter (Signed)
Scheduled appointment per 2/12 los. Patient is aware of the made appointment.

## 2022-05-06 ENCOUNTER — Other Ambulatory Visit: Payer: Self-pay | Admitting: *Deleted

## 2022-05-06 ENCOUNTER — Telehealth: Payer: Self-pay | Admitting: Hematology and Oncology

## 2022-05-06 DIAGNOSIS — D0512 Intraductal carcinoma in situ of left breast: Secondary | ICD-10-CM

## 2022-05-06 NOTE — Telephone Encounter (Signed)
Per 2/15 IB Scheduled patient for labs before seeing Gudena. Patient aware and confirmed.

## 2022-05-06 NOTE — Progress Notes (Signed)
RN successfully faxed repeat mammogram to [facility] for Commit trial in 6 months.

## 2022-05-07 ENCOUNTER — Other Ambulatory Visit: Payer: Self-pay | Admitting: Emergency Medicine

## 2022-05-07 DIAGNOSIS — D0512 Intraductal carcinoma in situ of left breast: Secondary | ICD-10-CM

## 2022-05-10 DIAGNOSIS — M19012 Primary osteoarthritis, left shoulder: Secondary | ICD-10-CM | POA: Diagnosis not present

## 2022-05-12 NOTE — Progress Notes (Unsigned)
Chief Complaint:   OBESITY Lindsey [name] is here to discuss her progress with her obesity treatment plan along with follow-up of her obesity related diagnoses. Lindsey [name] is on keeping a food journal and adhering to recommended goals of 1500-1800 calories and 100+ grams of protein and states she is following her eating plan approximately 1% of the time. Lindsey [name] states she is doing 0 minutes 0 times per week.  Today's visit was #: 63 Starting weight: 325 lbs Starting date: 06/04/2017 Today's weight: 230 lbs Today's date: 04/29/2022 Total lbs lost to date: 95 Total lbs lost since last in-office visit: 0  Interim History: Lindsey [name] got off track before the holidays. Her stress is higher and she has been snacking more especially on sweets. She is ready to get back on track with her weight loss efforts.   Subjective:   1. Pre-diabetes Lindsey [name] has a history of elevated A1c. She has been working on her diet and weight loss, and she has been better controlled. This may not be the case amymore due to increased snacking.   2. Stress Lindsey [name] is dealing with multiple stressful situations and this may have affected her emotional eating behaviors.   Assessment/Plan:   1. Pre-diabetes Lindsey [name] will go back to her eating plan and will recheck labs in 1 month.   2. Stress Lindsey [name] was offered support and encouragement. Stress reduction strategies were discussed, and I will continue to follow.   3. BMI 37.0-37.9, adult  4. Obesity, Beginning BMI 52.46 Lindsey [name] is currently in the action stage of change. As such, her goal is to continue with weight loss efforts. She has agreed to keeping a food journal and adhering to recommended goals of 1500-1800 calories and 100+ grams of protein daily.    Exercise goals: All adults should avoid inactivity. Some physical activity is better than none, and adults who participate in any amount of physical activity gain some health benefits.  Behavioral modification strategies: increasing lean  protein intake, better snacking choices, and keeping a strict food journal.  Lindsey [name] has agreed to follow-up with our clinic in 2 to 3 weeks. She was informed of the importance of frequent follow-up visits to maximize her success with intensive lifestyle modifications for her multiple health conditions.   Objective:   Blood pressure 127/71, pulse 82, temperature 98.5 F (36.9 C), height 5' 6"$  (1.676 m), weight 230 lb (104.3 kg), SpO2 97 %. Body mass index is 37.12 kg/m.  General: Cooperative, alert, well developed, in no acute distress. HEENT: Conjunctivae and lids unremarkable. Cardiovascular: Regular rhythm.  Lungs: Normal work of breathing. Neurologic: No focal deficits.   Lab Results  Component Value Date   CREATININE 0.63 05/03/2022   BUN 19 05/03/2022   NA 140 05/03/2022   K 3.9 05/03/2022   CL 106 05/03/2022   CO2 29 05/03/2022   Lab Results  Component Value Date   ALT 15 05/03/2022   AST 18 05/03/2022   ALKPHOS 63 05/03/2022   BILITOT 1.1 05/03/2022   Lab Results  Component Value Date   HGBA1C 5.2 08/20/2020   HGBA1C 5.2 03/26/2019   HGBA1C 5.4 03/09/2018   HGBA1C 5.5 10/12/2017   HGBA1C 5.7 (H) 05/25/2017   Lab Results  Component Value Date   INSULIN 7.3 08/20/2020   INSULIN 4.4 03/26/2019   INSULIN 10.0 03/09/2018   INSULIN 19.1 10/12/2017   INSULIN 44.2 (H) 05/25/2017   Lab Results  Component Value Date   TSH 2.88 09/19/2020   Lab Results  Component Value Date   CHOL 178 09/24/2020   HDL 76 (A) 09/24/2020   LDLCALC 92 09/24/2020   TRIG 50 09/19/2020   Lab Results  Component Value Date   VD25OH 69.5 08/20/2020   VD25OH 64.7 06/26/2019   VD25OH 56.6 03/26/2019   Lab Results  Component Value Date   WBC 5.6 05/03/2022   HGB 12.5 05/03/2022   HCT 36.3 05/03/2022   MCV 90.1 05/03/2022   PLT 277 05/03/2022   Lab Results  Component Value Date   FERRITIN 102.0 09/19/2020   Attestation Statements:   Reviewed by clinician on day of visit:  allergies, medications, problem list, medical history, surgical history, family history, social history, and previous encounter notes.   I, Trixie Dredge, am acting as transcriptionist for Dennard Nip, MD.  I have reviewed the above documentation for accuracy and completeness, and I agree with the above. -  Dennard Nip, MD

## 2022-05-13 ENCOUNTER — Ambulatory Visit (INDEPENDENT_AMBULATORY_CARE_PROVIDER_SITE_OTHER): Payer: Medicare PPO | Admitting: Family Medicine

## 2022-05-13 ENCOUNTER — Encounter (INDEPENDENT_AMBULATORY_CARE_PROVIDER_SITE_OTHER): Payer: Self-pay | Admitting: Family Medicine

## 2022-05-13 VITALS — BP 133/77 | HR 75 | Temp 99.1°F | Ht 66.0 in | Wt 225.0 lb

## 2022-05-13 DIAGNOSIS — Z6836 Body mass index (BMI) 36.0-36.9, adult: Secondary | ICD-10-CM

## 2022-05-13 DIAGNOSIS — F439 Reaction to severe stress, unspecified: Secondary | ICD-10-CM | POA: Diagnosis not present

## 2022-05-13 DIAGNOSIS — E669 Obesity, unspecified: Secondary | ICD-10-CM | POA: Diagnosis not present

## 2022-05-25 DIAGNOSIS — M1711 Unilateral primary osteoarthritis, right knee: Secondary | ICD-10-CM | POA: Diagnosis not present

## 2022-05-25 DIAGNOSIS — M170 Bilateral primary osteoarthritis of knee: Secondary | ICD-10-CM | POA: Diagnosis not present

## 2022-05-25 DIAGNOSIS — M1712 Unilateral primary osteoarthritis, left knee: Secondary | ICD-10-CM | POA: Diagnosis not present

## 2022-05-25 NOTE — Progress Notes (Unsigned)
Chief Complaint:   OBESITY Lindsey [name] is here to discuss her progress with her obesity treatment plan along with follow-up of her obesity related diagnoses. Lindsey [name] is on keeping a food journal and adhering to recommended goals of 1500-1800 calories and 100+ grams of protein and states she is following her eating plan approximately 75% of the time. Lindsey [name] states she is walking 4 times per week.    Today's visit was #: 35 Starting weight: 325 lbs Starting date: 05/25/2017 Today's weight: 225 lbs Today's date: 05/13/2022 Total lbs lost to date: 100 Total lbs lost since last in-office visit: 5  Interim History: Lindsey [name] has done very well with weight loss despite having extra challenges recently.  She notes some emotional eating behavior.  She is working on increasing her exercise.  She struggles to meet her protein goals.  Subjective:   1. Stress Lindsey [name] is caring for her mother who is in [facility] living.  She also notes some financial stressors and she has not been able to concentrate on her own health issues.  Assessment/Plan:   1. Stress Lindsey [name] was offered support and encouragement.  She was encouraged to not forget about her own health and needs.  2. BMI 36.0-36.9,adult  3. Obesity, Beginning BMI 52.46 Lindsey [name] is currently in the action stage of change. As such, her goal is to continue with weight loss efforts. She has agreed to keeping a food journal and adhering to recommended goals of 1500-1800 calories and 100+ grams of protein daily.   Exercise goals: As is.   Behavioral modification strategies: increasing lean protein intake and emotional eating strategies.  Lindsey [name] has agreed to follow-up with our clinic in 4 weeks. She was informed of the importance of frequent follow-up visits to maximize her success with intensive lifestyle modifications for her multiple health conditions.   Objective:   Blood pressure 133/77, pulse 75, temperature 99.1 F (37.3 C), height '5\' 6"'$  (1.676 m), weight 225  lb (102.1 kg), SpO2 97 %. Body mass index is 36.32 kg/m.  Lab Results  Component Value Date   CREATININE 0.63 05/03/2022   BUN 19 05/03/2022   NA 140 05/03/2022   K 3.9 05/03/2022   CL 106 05/03/2022   CO2 29 05/03/2022   Lab Results  Component Value Date   ALT 15 05/03/2022   AST 18 05/03/2022   ALKPHOS 63 05/03/2022   BILITOT 1.1 05/03/2022   Lab Results  Component Value Date   HGBA1C 5.2 08/20/2020   HGBA1C 5.2 03/26/2019   HGBA1C 5.4 03/09/2018   HGBA1C 5.5 10/12/2017   HGBA1C 5.7 (H) 05/25/2017   Lab Results  Component Value Date   INSULIN 7.3 08/20/2020   INSULIN 4.4 03/26/2019   INSULIN 10.0 03/09/2018   INSULIN 19.1 10/12/2017   INSULIN 44.2 (H) 05/25/2017   Lab Results  Component Value Date   TSH 2.88 09/19/2020   Lab Results  Component Value Date   CHOL 178 09/24/2020   HDL 76 (A) 09/24/2020   LDLCALC 92 09/24/2020   TRIG 50 09/19/2020   Lab Results  Component Value Date   VD25OH 69.5 08/20/2020   VD25OH 64.7 06/26/2019   VD25OH 56.6 03/26/2019   Lab Results  Component Value Date   WBC 5.6 05/03/2022   HGB 12.5 05/03/2022   HCT 36.3 05/03/2022   MCV 90.1 05/03/2022   PLT 277 05/03/2022   Lab Results  Component Value Date   FERRITIN 102.0 09/19/2020   Attestation Statements:   Reviewed by  clinician on day of visit: allergies, medications, problem list, medical history, surgical history, family history, social history, and previous encounter notes.  Time spent on visit including pre-visit chart review and post-visit care and charting was 30 minutes.   I, Trixie Dredge, am acting as transcriptionist for Dennard Nip, MD.  I have reviewed the above documentation for accuracy and completeness, and I agree with the above. -  Dennard Nip, MD

## 2022-05-27 ENCOUNTER — Encounter: Payer: Self-pay | Admitting: *Deleted

## 2022-05-27 ENCOUNTER — Ambulatory Visit (INDEPENDENT_AMBULATORY_CARE_PROVIDER_SITE_OTHER): Payer: Medicare PPO | Admitting: Family Medicine

## 2022-05-27 ENCOUNTER — Encounter (INDEPENDENT_AMBULATORY_CARE_PROVIDER_SITE_OTHER): Payer: Self-pay | Admitting: Family Medicine

## 2022-05-27 VITALS — BP 128/73 | HR 71 | Temp 97.9°F | Ht 66.0 in | Wt 222.0 lb

## 2022-05-27 DIAGNOSIS — M25561 Pain in right knee: Secondary | ICD-10-CM

## 2022-05-27 DIAGNOSIS — G8929 Other chronic pain: Secondary | ICD-10-CM

## 2022-05-27 DIAGNOSIS — Z6839 Body mass index (BMI) 39.0-39.9, adult: Secondary | ICD-10-CM | POA: Insufficient documentation

## 2022-05-27 DIAGNOSIS — Z6835 Body mass index (BMI) 35.0-35.9, adult: Secondary | ICD-10-CM

## 2022-05-27 NOTE — Progress Notes (Signed)
Received Pre-Operative Risk Assessment from [facility] for right total knee arthroplasty and requesting Oncology standpoint.   Per MD pt currently not under active treatment and okay to proceed with surgery.  Assessment successfully faxed to number provided [phone].

## 2022-05-28 ENCOUNTER — Telehealth: Payer: Self-pay | Admitting: *Deleted

## 2022-05-28 NOTE — Telephone Encounter (Signed)
   Pre-operative Risk Assessment    Patient Name: Lindsey [name]  DOB: Jun 28, 1955 MRN: [id number]      Request for Surgical Clearance    Procedure:   RIGHT TOTAL KNEE ARTHROPLASTY  Date of Surgery:  Clearance TBD                                 Surgeon:  Lowella Petties, MD Surgeon's Group or Practice Name:  [facility] Phone number:  [phone] Fax number:  [phone]   Type of Clearance Requested:   - Medical    Type of Anesthesia:  Spinal   Additional requests/questions:    Astrid Divine   05/28/2022, 7:46 AM

## 2022-05-28 NOTE — Telephone Encounter (Signed)
Lindsey [name], we have received request for clearance for right total knee arthroplasty on this patient that you saw on 04/21/2022. Would you please comment on medical clearance for upcoming procedure and forward your response to p cv div preop?  Thank you, Sharyn Lull

## 2022-05-28 NOTE — Telephone Encounter (Signed)
NOTICE INCORRECT PHONE & FAX# BELOW:  PHONE:  ID:[id number] [place]:  [phone]

## 2022-06-02 NOTE — Telephone Encounter (Signed)
Dr. Harriet Masson, We have received request for clearance for right total knee arthroplasty on Lindsey [name]. She was seen by you on  04/21/2022. Would you please comment on medical clearance for upcoming procedure and forward your response to p cv div preop?   Thank you,  Ambrose Pancoast, NP

## 2022-06-03 NOTE — Telephone Encounter (Signed)
   Patient Name: Lindsey [name]  DOB: 01/29/1956 MRN: [id number]  Primary Cardiologist: Berniece Salines, DO  Chart reviewed as part of pre-operative protocol coverage. Given past medical history and time since last visit, based on ACC/AHA guidelines, Lindsey [name] is at acceptable risk for the planned procedure without further cardiovascular testing.   Per Dr. Harriet Masson, who saw pt in clinic on 04/21/2022, pt ok to proceed with surgery.   I will route this recommendation to the requesting party via Epic fax function and remove from pre-op pool.  Please call with questions.  Lenna Sciara, NP 06/03/2022, 12:54 PM

## 2022-06-08 NOTE — Progress Notes (Unsigned)
Chief Complaint:   OBESITY Lindsey [name] is here to discuss her progress with her obesity treatment plan along with follow-up of her obesity related diagnoses. Lindsey [name] is on keeping a food journal and adhering to recommended goals of 1500-1800 calories and 100+ grams of protein and states she is following her eating plan approximately 75% of the time. Lindsey [name] states she is walking for 30 minutes 4 times per week.  Today's visit was #: 56 Starting weight: 325 lbs Starting date: 05/25/2017 Today's weight: 222 lbs Today's date: 05/27/2022 Total lbs lost to date: 103 Total lbs lost since last in-office visit: 3  Interim History: Lindsey [name] continues to do well with weight loss.  Her activity has been limited due to her knee pain.  She is working on meal planning.  She has questions about various topics in nutrition.  Subjective:   1. Chronic pain of right knee Lindsey [name] continues to work on weight loss.  She is hoping to have right knee replacement soon.  Assessment/Plan:   1. Chronic pain of right knee Lindsey [name] was encouraged to increase her protein to 100 g daily to improve recovery and decrease the risk of infection.  We discussed a variety of ways that she can increase her protein in depth today.   2. BMI 35.0-35.9,adult  3. Obesity, Beginning BMI 52.46 Lindsey [name] is currently in the action stage of change. As such, her goal is to continue with weight loss efforts. She has agreed to keeping a food journal and adhering to recommended goals of 1500-1800 calories and 100+ grams of protein daily.   Exercise goals: As is.   Behavioral modification strategies: increasing lean protein intake.  Lindsey [name] has agreed to follow-up with our clinic in 3 weeks. She was informed of the importance of frequent follow-up visits to maximize her success with intensive lifestyle modifications for her multiple health conditions.   Objective:   Blood pressure 128/73, pulse 71, temperature 97.9 F (36.6 C), height 5\' 6"  (1.676 m), weight  222 lb (100.7 kg), SpO2 95 %. Body mass index is 35.83 kg/m.  Lab Results  Component Value Date   CREATININE 0.63 05/03/2022   BUN 19 05/03/2022   NA 140 05/03/2022   K 3.9 05/03/2022   CL 106 05/03/2022   CO2 29 05/03/2022   Lab Results  Component Value Date   ALT 15 05/03/2022   AST 18 05/03/2022   ALKPHOS 63 05/03/2022   BILITOT 1.1 05/03/2022   Lab Results  Component Value Date   HGBA1C 5.2 08/20/2020   HGBA1C 5.2 03/26/2019   HGBA1C 5.4 03/09/2018   HGBA1C 5.5 10/12/2017   HGBA1C 5.7 (H) 05/25/2017   Lab Results  Component Value Date   INSULIN 7.3 08/20/2020   INSULIN 4.4 03/26/2019   INSULIN 10.0 03/09/2018   INSULIN 19.1 10/12/2017   INSULIN 44.2 (H) 05/25/2017   Lab Results  Component Value Date   TSH 2.88 09/19/2020   Lab Results  Component Value Date   CHOL 178 09/24/2020   HDL 76 (A) 09/24/2020   LDLCALC 92 09/24/2020   TRIG 50 09/19/2020   Lab Results  Component Value Date   VD25OH 69.5 08/20/2020   VD25OH 64.7 06/26/2019   VD25OH 56.6 03/26/2019   Lab Results  Component Value Date   WBC 5.6 05/03/2022   HGB 12.5 05/03/2022   HCT 36.3 05/03/2022   MCV 90.1 05/03/2022   PLT 277 05/03/2022   Lab Results  Component Value Date   FERRITIN 102.0 09/19/2020  Attestation Statements:   Reviewed by clinician on day of visit: allergies, medications, problem list, medical history, surgical history, family history, social history, and previous encounter notes.  Time spent on visit including pre-visit chart review and post-visit care and charting was 40 minutes.   I, Trixie Dredge, am acting as transcriptionist for Dennard Nip, MD.  I have reviewed the above documentation for accuracy and completeness, and I agree with the above. -  Dennard Nip, MD

## 2022-06-10 ENCOUNTER — Ambulatory Visit (INDEPENDENT_AMBULATORY_CARE_PROVIDER_SITE_OTHER): Payer: Medicare PPO | Admitting: Family Medicine

## 2022-06-10 ENCOUNTER — Encounter (INDEPENDENT_AMBULATORY_CARE_PROVIDER_SITE_OTHER): Payer: Self-pay | Admitting: Family Medicine

## 2022-06-10 VITALS — BP 142/76 | HR 78 | Temp 98.2°F | Ht 66.0 in | Wt 220.0 lb

## 2022-06-10 DIAGNOSIS — E669 Obesity, unspecified: Secondary | ICD-10-CM | POA: Diagnosis not present

## 2022-06-10 DIAGNOSIS — Z6835 Body mass index (BMI) 35.0-35.9, adult: Secondary | ICD-10-CM | POA: Diagnosis not present

## 2022-06-10 DIAGNOSIS — F3289 Other specified depressive episodes: Secondary | ICD-10-CM | POA: Diagnosis not present

## 2022-06-10 DIAGNOSIS — F32A Depression, unspecified: Secondary | ICD-10-CM | POA: Insufficient documentation

## 2022-06-15 NOTE — Progress Notes (Signed)
Chief Complaint:   OBESITY Emanda is here to discuss her progress with her obesity treatment plan along with follow-up of her obesity related diagnoses. Melanni is on keeping a food journal and adhering to recommended goals of 1500-1800 calories and 100+ grams of protein and states she is following her eating plan approximately 50% of the time. Lavada states she is walking more.    Today's visit was #: 29 Starting weight: 325 lbs Starting date: 05/25/2017 Today's weight: 220 lbs Today's date: 06/10/2022 Total lbs lost to date: 105 Total lbs lost since last in-office visit: 2  Interim History: Darya has done well with her weight loss.  She has done better with journaling even though she is unsure of her calories.  Subjective:   1. Emotional Eating Behavior Nicey is working on not falling into the all or nothing eating habits.  She did a little of this since her last visit, but it appears that she is doing better overall.  Assessment/Plan:   1. Emotional Eating Behavior Fanta was encouraged to continue to journal and be mindful, and will continue to follow frequently.  2. BMI 35.0-35.9,adult  3. Obesity, Beginning BMI 52.46 Magdiel is currently in the action stage of change. As such, her goal is to continue with weight loss efforts. She has agreed to keeping a food journal and adhering to recommended goals of 1500-1800 calories and 100+ grams of protein daily.   Exercise goals: As is.   Behavioral modification strategies: increasing lean protein intake and keeping a strict food journal.  Setsuko has agreed to follow-up with our clinic in 2 to 3 weeks. She was informed of the importance of frequent follow-up visits to maximize her success with intensive lifestyle modifications for her multiple health conditions.   Objective:   Blood pressure (!) 142/76, pulse 78, temperature 98.2 F (36.8 C), height 5\' 6"  (1.676 m), weight 220 lb (99.8 kg), SpO2 94 %. Body mass index is 35.51 kg/m.  Lab  Results  Component Value Date   CREATININE 0.63 05/03/2022   BUN 19 05/03/2022   NA 140 05/03/2022   K 3.9 05/03/2022   CL 106 05/03/2022   CO2 29 05/03/2022   Lab Results  Component Value Date   ALT 15 05/03/2022   AST 18 05/03/2022   ALKPHOS 63 05/03/2022   BILITOT 1.1 05/03/2022   Lab Results  Component Value Date   HGBA1C 5.2 08/20/2020   HGBA1C 5.2 03/26/2019   HGBA1C 5.4 03/09/2018   HGBA1C 5.5 10/12/2017   HGBA1C 5.7 (H) 05/25/2017   Lab Results  Component Value Date   INSULIN 7.3 08/20/2020   INSULIN 4.4 03/26/2019   INSULIN 10.0 03/09/2018   INSULIN 19.1 10/12/2017   INSULIN 44.2 (H) 05/25/2017   Lab Results  Component Value Date   TSH 2.88 09/19/2020   Lab Results  Component Value Date   CHOL 178 09/24/2020   HDL 76 (A) 09/24/2020   LDLCALC 92 09/24/2020   TRIG 50 09/19/2020   Lab Results  Component Value Date   VD25OH 69.5 08/20/2020   VD25OH 64.7 06/26/2019   VD25OH 56.6 03/26/2019   Lab Results  Component Value Date   WBC 5.6 05/03/2022   HGB 12.5 05/03/2022   HCT 36.3 05/03/2022   MCV 90.1 05/03/2022   PLT 277 05/03/2022   Lab Results  Component Value Date   FERRITIN 102.0 09/19/2020   Attestation Statements:   Reviewed by clinician on day of visit: allergies, medications, problem  list, medical history, surgical history, family history, social history, and previous encounter notes.  Time spent on visit including pre-visit chart review and post-visit care and charting was 30 minutes.   I, Trixie Dredge, am acting as transcriptionist for Dennard Nip, MD.  I have reviewed the above documentation for accuracy and completeness, and I agree with the above. -  Dennard Nip, MD

## 2022-06-30 DIAGNOSIS — I10 Essential (primary) hypertension: Secondary | ICD-10-CM | POA: Diagnosis not present

## 2022-06-30 DIAGNOSIS — I4510 Unspecified right bundle-branch block: Secondary | ICD-10-CM | POA: Diagnosis not present

## 2022-06-30 DIAGNOSIS — Z01818 Encounter for other preprocedural examination: Secondary | ICD-10-CM | POA: Diagnosis not present

## 2022-06-30 DIAGNOSIS — R7303 Prediabetes: Secondary | ICD-10-CM | POA: Diagnosis not present

## 2022-06-30 DIAGNOSIS — M170 Bilateral primary osteoarthritis of knee: Secondary | ICD-10-CM | POA: Diagnosis not present

## 2022-06-30 DIAGNOSIS — D0510 Intraductal carcinoma in situ of unspecified breast: Secondary | ICD-10-CM | POA: Diagnosis not present

## 2022-07-01 ENCOUNTER — Ambulatory Visit (INDEPENDENT_AMBULATORY_CARE_PROVIDER_SITE_OTHER): Payer: Medicare PPO | Admitting: Family Medicine

## 2022-07-01 ENCOUNTER — Encounter (INDEPENDENT_AMBULATORY_CARE_PROVIDER_SITE_OTHER): Payer: Self-pay | Admitting: Family Medicine

## 2022-07-01 VITALS — BP 126/71 | HR 75 | Temp 98.0°F | Ht 66.0 in | Wt 222.0 lb

## 2022-07-01 DIAGNOSIS — R632 Polyphagia: Secondary | ICD-10-CM

## 2022-07-01 DIAGNOSIS — Z6835 Body mass index (BMI) 35.0-35.9, adult: Secondary | ICD-10-CM | POA: Diagnosis not present

## 2022-07-01 DIAGNOSIS — E669 Obesity, unspecified: Secondary | ICD-10-CM

## 2022-07-01 DIAGNOSIS — F3289 Other specified depressive episodes: Secondary | ICD-10-CM

## 2022-07-02 ENCOUNTER — Ambulatory Visit: Payer: Medicare PPO | Admitting: Cardiology

## 2022-07-05 NOTE — Progress Notes (Unsigned)
Chief Complaint:   OBESITY Lindsey [name] is here to discuss her progress with her obesity treatment plan along with follow-up of her obesity related diagnoses. Lindsey [name] is on keeping a food journal and adhering to recommended goals of 1500-1800 calories and 100+ grams of protein and states she is following her eating plan approximately 65% of the time. Lindsey [name] states she is walking for 30 minutes 4 times per week.  Today's visit was #: 49 Starting weight: 325 lbs Starting date: 05/25/2017 Today's weight: 222 lbs Today's date: 07/01/2022 Total lbs lost to date: 103 Total lbs lost since last in-office visit: 0  Interim History: Lindsey [name] continues to work on staying active.  She is especially struggling with social eating and meal planning as well as carbohydrate cravings.  Subjective:   1. Polyphagia Lindsey [name] notes excessive hunger signals.  She is not diabetic and her insurance will not cover GLP-1 for obesity.  2. Emotional Eating Behavior Lindsey [name] still struggles with emotional eating behavior, with no improvement with Wellbutrin or topiramate.  She is trying to be mindful and decreasing snacking.  Assessment/Plan:   1. Polyphagia Lindsey [name] was offered metformin, and she declined.  She will reconsider after her knee surgery.  She will work on increasing her protein intake.  2. Emotional Eating Behavior Lindsey [name] will continue to work on meeting her protein goals and use the emotional eating behavior strategies we have discussed previously.  3. BMI 35.0-35.9,adult  4. Obesity, Beginning BMI 52.46 Lindsey [name] is currently in the action stage of change. As such, her goal is to continue with weight loss efforts. She has agreed to keeping a food journal and adhering to recommended goals of 1500-1800 calories and 100+ grams of protein daily.   Exercise goals: As is.   Behavioral modification strategies: increasing lean protein intake, decreasing simple carbohydrates, emotional eating strategies, and keeping a strict food  journal.  Lindsey [name] has agreed to follow-up with our clinic in 3 weeks. She was informed of the importance of frequent follow-up visits to maximize her success with intensive lifestyle modifications for her multiple health conditions.   Objective:   Blood pressure 126/71, pulse 75, temperature 98 F (36.7 C), height 5\' 6"  (1.676 m), weight 222 lb (100.7 kg), SpO2 97 %. Body mass index is 35.83 kg/m.  Lab Results  Component Value Date   CREATININE 0.63 05/03/2022   BUN 19 05/03/2022   NA 140 05/03/2022   K 3.9 05/03/2022   CL 106 05/03/2022   CO2 29 05/03/2022   Lab Results  Component Value Date   ALT 15 05/03/2022   AST 18 05/03/2022   ALKPHOS 63 05/03/2022   BILITOT 1.1 05/03/2022   Lab Results  Component Value Date   HGBA1C 5.2 08/20/2020   HGBA1C 5.2 03/26/2019   HGBA1C 5.4 03/09/2018   HGBA1C 5.5 10/12/2017   HGBA1C 5.7 (H) 05/25/2017   Lab Results  Component Value Date   INSULIN 7.3 08/20/2020   INSULIN 4.4 03/26/2019   INSULIN 10.0 03/09/2018   INSULIN 19.1 10/12/2017   INSULIN 44.2 (H) 05/25/2017   Lab Results  Component Value Date   TSH 2.88 09/19/2020   Lab Results  Component Value Date   CHOL 178 09/24/2020   HDL 76 (A) 09/24/2020   LDLCALC 92 09/24/2020   TRIG 50 09/19/2020   Lab Results  Component Value Date   VD25OH 69.5 08/20/2020   VD25OH 64.7 06/26/2019   VD25OH 56.6 03/26/2019   Lab Results  Component Value Date  WBC 5.6 05/03/2022   HGB 12.5 05/03/2022   HCT 36.3 05/03/2022   MCV 90.1 05/03/2022   PLT 277 05/03/2022   Lab Results  Component Value Date   FERRITIN 102.0 09/19/2020   Attestation Statements:   Reviewed by clinician on day of visit: allergies, medications, problem list, medical history, surgical history, family history, social history, and previous encounter notes.  Time spent on visit including pre-visit chart review and post-visit care and charting was 30 minutes.   I, Burt Knack, am acting as  transcriptionist for Quillian Quince, MD.  I have reviewed the above documentation for accuracy and completeness, and I agree with the above. -  Quillian Quince, MD

## 2022-07-15 ENCOUNTER — Encounter (INDEPENDENT_AMBULATORY_CARE_PROVIDER_SITE_OTHER): Payer: Self-pay | Admitting: Family Medicine

## 2022-07-15 ENCOUNTER — Ambulatory Visit (INDEPENDENT_AMBULATORY_CARE_PROVIDER_SITE_OTHER): Payer: Medicare PPO | Admitting: Family Medicine

## 2022-07-15 VITALS — BP 136/82 | HR 86 | Temp 98.4°F | Ht 66.0 in | Wt 224.0 lb

## 2022-07-15 DIAGNOSIS — Z6836 Body mass index (BMI) 36.0-36.9, adult: Secondary | ICD-10-CM

## 2022-07-15 DIAGNOSIS — E669 Obesity, unspecified: Secondary | ICD-10-CM

## 2022-07-15 DIAGNOSIS — F3289 Other specified depressive episodes: Secondary | ICD-10-CM | POA: Diagnosis not present

## 2022-07-15 DIAGNOSIS — E88819 Insulin resistance, unspecified: Secondary | ICD-10-CM | POA: Insufficient documentation

## 2022-07-20 NOTE — Progress Notes (Signed)
Chief Complaint:   OBESITY Lindsey [name] is here to discuss her progress with her obesity treatment plan along with follow-up of her obesity related diagnoses. Lindsey [name] is on keeping a food journal and adhering to recommended goals of 1500-1800 calories and 100+ grams of protein and states she is following her eating plan approximately 60% of the time. Lindsey [name] states she is walking for 30 minutes 5 times per week.  Today's visit was #: 50 Starting weight: 325 lbs Starting date: 05/25/2017 Today's weight: 224 lbs Today's date: 07/15/2022 Total lbs lost to date: 101 Total lbs lost since last in-office visit: 0  Interim History: Lindsey [name] is struggling to keep off her weight loss.  She is frustrated with herself and she is getting on a meal plan routine.  She is working on bringing her lunch to work, but she notes afternoon hunger and she snacks on simple carbohydrates more.  Subjective:   1. Insulin resistance Lindsey [name] is unable to get coverage for GLP-1 drugs and she has declined to take metformin.  She is trying to increase walking, but she has significant knee osteoarthritis and she is working on getting a knee replacement.  2. Emotional Eating Behavior Lindsey [name] struggles with some emotional eating behavior, worse in the afternoons.  She is concerned that she may be doing more boredom eating while recovering from knee replacement surgery.  Assessment/Plan:   1. Insulin resistance Lindsey [name] is to continue working on decreasing simple carbohydrates especially in the second half of the day.  2. Emotional Eating Behavior Emotional eating behavior strategies were discussed, and we will continue to monitor.  3. BMI 36.0-36.9,adult  4. Obesity, Beginning BMI 52.46 Lindsey [name] is currently in the action stage of change. As such, her goal is to continue with weight loss efforts. She has agreed to keeping a food journal and adhering to recommended goals of 1500-1800 calories and 100+ grams of protein daily.   Exercise goals:  As is.   Behavioral modification strategies: decreasing simple carbohydrates, no skipping meals, and emotional eating strategies.  Lindsey [name] has agreed to follow-up with our clinic in 3 to 4 weeks. She was informed of the importance of frequent follow-up visits to maximize her success with intensive lifestyle modifications for her multiple health conditions.   Objective:   Blood pressure 136/82, pulse 86, temperature 98.4 F (36.9 C), height 5\' 6"  (1.676 m), weight 224 lb (101.6 kg), SpO2 97 %. Body mass index is 36.15 kg/m.  Lab Results  Component Value Date   CREATININE 0.63 05/03/2022   BUN 19 05/03/2022   NA 140 05/03/2022   K 3.9 05/03/2022   CL 106 05/03/2022   CO2 29 05/03/2022   Lab Results  Component Value Date   ALT 15 05/03/2022   AST 18 05/03/2022   ALKPHOS 63 05/03/2022   BILITOT 1.1 05/03/2022   Lab Results  Component Value Date   HGBA1C 5.2 08/20/2020   HGBA1C 5.2 03/26/2019   HGBA1C 5.4 03/09/2018   HGBA1C 5.5 10/12/2017   HGBA1C 5.7 (H) 05/25/2017   Lab Results  Component Value Date   INSULIN 7.3 08/20/2020   INSULIN 4.4 03/26/2019   INSULIN 10.0 03/09/2018   INSULIN 19.1 10/12/2017   INSULIN 44.2 (H) 05/25/2017   Lab Results  Component Value Date   TSH 2.88 09/19/2020   Lab Results  Component Value Date   CHOL 178 09/24/2020   HDL 76 (A) 09/24/2020   LDLCALC 92 09/24/2020   TRIG 50 09/19/2020   Lab Results  Component Value Date   VD25OH 69.5 08/20/2020   VD25OH 64.7 06/26/2019   VD25OH 56.6 03/26/2019   Lab Results  Component Value Date   WBC 5.6 05/03/2022   HGB 12.5 05/03/2022   HCT 36.3 05/03/2022   MCV 90.1 05/03/2022   PLT 277 05/03/2022   Lab Results  Component Value Date   FERRITIN 102.0 09/19/2020   Attestation Statements:   Reviewed by clinician on day of visit: allergies, medications, problem list, medical history, surgical history, family history, social history, and previous encounter notes.  Time spent on visit  including pre-visit chart review and post-visit care and charting was 32 minutes.   I, Burt Knack, am acting as transcriptionist for Quillian Quince, MD.  I have reviewed the above documentation for accuracy and completeness, and I agree with the above. -  Quillian Quince, MD

## 2022-07-22 DIAGNOSIS — M1731 Unilateral post-traumatic osteoarthritis, right knee: Secondary | ICD-10-CM | POA: Diagnosis not present

## 2022-07-22 DIAGNOSIS — R262 Difficulty in walking, not elsewhere classified: Secondary | ICD-10-CM | POA: Diagnosis not present

## 2022-07-22 DIAGNOSIS — M25661 Stiffness of right knee, not elsewhere classified: Secondary | ICD-10-CM | POA: Diagnosis not present

## 2022-07-29 DIAGNOSIS — M1711 Unilateral primary osteoarthritis, right knee: Secondary | ICD-10-CM | POA: Diagnosis not present

## 2022-08-05 ENCOUNTER — Ambulatory Visit (INDEPENDENT_AMBULATORY_CARE_PROVIDER_SITE_OTHER): Payer: Medicare PPO | Admitting: Family Medicine

## 2022-08-11 DIAGNOSIS — Z96651 Presence of right artificial knee joint: Secondary | ICD-10-CM | POA: Diagnosis not present

## 2022-08-11 DIAGNOSIS — G8918 Other acute postprocedural pain: Secondary | ICD-10-CM | POA: Diagnosis not present

## 2022-08-11 DIAGNOSIS — M1711 Unilateral primary osteoarthritis, right knee: Secondary | ICD-10-CM | POA: Diagnosis not present

## 2022-08-12 DIAGNOSIS — Z96651 Presence of right artificial knee joint: Secondary | ICD-10-CM | POA: Diagnosis not present

## 2022-08-12 DIAGNOSIS — Z471 Aftercare following joint replacement surgery: Secondary | ICD-10-CM | POA: Diagnosis not present

## 2022-08-12 DIAGNOSIS — Z7982 Long term (current) use of aspirin: Secondary | ICD-10-CM | POA: Diagnosis not present

## 2022-08-14 DIAGNOSIS — Z471 Aftercare following joint replacement surgery: Secondary | ICD-10-CM | POA: Diagnosis not present

## 2022-08-14 DIAGNOSIS — Z7982 Long term (current) use of aspirin: Secondary | ICD-10-CM | POA: Diagnosis not present

## 2022-08-14 DIAGNOSIS — Z96651 Presence of right artificial knee joint: Secondary | ICD-10-CM | POA: Diagnosis not present

## 2022-08-17 DIAGNOSIS — Z7982 Long term (current) use of aspirin: Secondary | ICD-10-CM | POA: Diagnosis not present

## 2022-08-17 DIAGNOSIS — Z471 Aftercare following joint replacement surgery: Secondary | ICD-10-CM | POA: Diagnosis not present

## 2022-08-17 DIAGNOSIS — Z96651 Presence of right artificial knee joint: Secondary | ICD-10-CM | POA: Diagnosis not present

## 2022-08-18 DIAGNOSIS — Z96651 Presence of right artificial knee joint: Secondary | ICD-10-CM | POA: Diagnosis not present

## 2022-08-18 DIAGNOSIS — Z7982 Long term (current) use of aspirin: Secondary | ICD-10-CM | POA: Diagnosis not present

## 2022-08-18 DIAGNOSIS — Z471 Aftercare following joint replacement surgery: Secondary | ICD-10-CM | POA: Diagnosis not present

## 2022-08-20 DIAGNOSIS — Z96651 Presence of right artificial knee joint: Secondary | ICD-10-CM | POA: Diagnosis not present

## 2022-08-20 DIAGNOSIS — Z471 Aftercare following joint replacement surgery: Secondary | ICD-10-CM | POA: Diagnosis not present

## 2022-08-20 DIAGNOSIS — Z7982 Long term (current) use of aspirin: Secondary | ICD-10-CM | POA: Diagnosis not present

## 2022-08-24 DIAGNOSIS — M25661 Stiffness of right knee, not elsewhere classified: Secondary | ICD-10-CM | POA: Diagnosis not present

## 2022-08-24 DIAGNOSIS — Z471 Aftercare following joint replacement surgery: Secondary | ICD-10-CM | POA: Diagnosis not present

## 2022-08-27 DIAGNOSIS — M25661 Stiffness of right knee, not elsewhere classified: Secondary | ICD-10-CM | POA: Diagnosis not present

## 2022-08-30 DIAGNOSIS — M25661 Stiffness of right knee, not elsewhere classified: Secondary | ICD-10-CM | POA: Diagnosis not present

## 2022-08-31 ENCOUNTER — Telehealth (INDEPENDENT_AMBULATORY_CARE_PROVIDER_SITE_OTHER): Payer: Medicare PPO | Admitting: Family Medicine

## 2022-08-31 DIAGNOSIS — E669 Obesity, unspecified: Secondary | ICD-10-CM | POA: Diagnosis not present

## 2022-08-31 DIAGNOSIS — R632 Polyphagia: Secondary | ICD-10-CM | POA: Diagnosis not present

## 2022-08-31 NOTE — Progress Notes (Unsigned)
TeleHealth Visit:  Due to the COVID-19 pandemic, this visit was completed with telemedicine (audio/video) technology to reduce patient and provider exposure as well as to preserve personal protective equipment.   Lindsey [name] has verbally consented to this TeleHealth visit. The patient is located at home, the provider is located at the [facility] office. The participants in this visit include the listed provider and patient. The visit was conducted today via MyChart video.   Chief Complaint: OBESITY Lindsey [name] is here to discuss her progress with her obesity treatment plan along with follow-up of her obesity related diagnoses. Lindsey [name] is on {MWMwtlossportion/plan2:23431} and states she is following her eating plan approximately ***% of the time. Lindsey [name] states she is *** *** minutes *** times per week.  Today's visit was #: 51 Starting weight: 325 lbs Starting date: 05/25/2017  Interim History: ***  Subjective:   1. Polyphagia ***  Assessment/Plan:   1. Polyphagia ***  2. Obesity, Beginning BMI 52.46 Lindsey [name] is currently in the action stage of change. As such, her goal is to continue with weight loss efforts. She has agreed to keeping a food journal and adhering to recommended goals of 1500-1800 calories and 100+ grams of protein daily.   Behavioral modification strategies: increasing lean protein intake.  Lindsey [name] has agreed to follow-up with our clinic in 4 weeks. She was informed of the importance of frequent follow-up visits to maximize her success with intensive lifestyle modifications for her multiple health conditions.  Objective:   VITALS: Per patient if applicable, see vitals. GENERAL: Alert and in no acute distress. CARDIOPULMONARY: No increased WOB. Speaking in clear sentences.  PSYCH: Pleasant and cooperative. Speech normal rate and rhythm. Affect is appropriate. Insight and judgement are appropriate. Attention is focused, linear, and appropriate.  NEURO: Oriented as  arrived to appointment on time with no prompting.   Lab Results  Component Value Date   CREATININE 0.63 05/03/2022   BUN 19 05/03/2022   NA 140 05/03/2022   K 3.9 05/03/2022   CL 106 05/03/2022   CO2 29 05/03/2022   Lab Results  Component Value Date   ALT 15 05/03/2022   AST 18 05/03/2022   ALKPHOS 63 05/03/2022   BILITOT 1.1 05/03/2022   Lab Results  Component Value Date   HGBA1C 5.2 08/20/2020   HGBA1C 5.2 03/26/2019   HGBA1C 5.4 03/09/2018   HGBA1C 5.5 10/12/2017   HGBA1C 5.7 (H) 05/25/2017   Lab Results  Component Value Date   INSULIN 7.3 08/20/2020   INSULIN 4.4 03/26/2019   INSULIN 10.0 03/09/2018   INSULIN 19.1 10/12/2017   INSULIN 44.2 (H) 05/25/2017   Lab Results  Component Value Date   TSH 2.88 09/19/2020   Lab Results  Component Value Date   CHOL 178 09/24/2020   HDL 76 (A) 09/24/2020   LDLCALC 92 09/24/2020   TRIG 50 09/19/2020   Lab Results  Component Value Date   VD25OH 69.5 08/20/2020   VD25OH 64.7 06/26/2019   VD25OH 56.6 03/26/2019   Lab Results  Component Value Date   WBC 5.6 05/03/2022   HGB 12.5 05/03/2022   HCT 36.3 05/03/2022   MCV 90.1 05/03/2022   PLT 277 05/03/2022   Lab Results  Component Value Date   FERRITIN 102.0 09/19/2020    Attestation Statements:   Reviewed by clinician on day of visit: allergies, medications, problem list, medical history, surgical history, family history, social history, and previous encounter notes.   Trude Mcburney, am acting as  transcriptionist for Quillian Quince, MD.  I have reviewed the above documentation for accuracy and completeness, and I agree with the above. - ***

## 2022-09-01 DIAGNOSIS — M25661 Stiffness of right knee, not elsewhere classified: Secondary | ICD-10-CM | POA: Diagnosis not present

## 2022-09-03 DIAGNOSIS — M25661 Stiffness of right knee, not elsewhere classified: Secondary | ICD-10-CM | POA: Diagnosis not present

## 2022-09-06 DIAGNOSIS — M25661 Stiffness of right knee, not elsewhere classified: Secondary | ICD-10-CM | POA: Diagnosis not present

## 2022-09-09 DIAGNOSIS — M25661 Stiffness of right knee, not elsewhere classified: Secondary | ICD-10-CM | POA: Diagnosis not present

## 2022-09-14 DIAGNOSIS — M25661 Stiffness of right knee, not elsewhere classified: Secondary | ICD-10-CM | POA: Diagnosis not present

## 2022-09-14 DIAGNOSIS — M1712 Unilateral primary osteoarthritis, left knee: Secondary | ICD-10-CM | POA: Diagnosis not present

## 2022-09-17 DIAGNOSIS — M25661 Stiffness of right knee, not elsewhere classified: Secondary | ICD-10-CM | POA: Diagnosis not present

## 2022-09-21 DIAGNOSIS — M25661 Stiffness of right knee, not elsewhere classified: Secondary | ICD-10-CM | POA: Diagnosis not present

## 2022-09-22 DIAGNOSIS — M25661 Stiffness of right knee, not elsewhere classified: Secondary | ICD-10-CM | POA: Diagnosis not present

## 2022-09-28 DIAGNOSIS — M25661 Stiffness of right knee, not elsewhere classified: Secondary | ICD-10-CM | POA: Diagnosis not present

## 2022-09-30 DIAGNOSIS — M25661 Stiffness of right knee, not elsewhere classified: Secondary | ICD-10-CM | POA: Diagnosis not present

## 2022-10-06 ENCOUNTER — Telehealth: Payer: Self-pay | Admitting: Emergency Medicine

## 2022-10-06 DIAGNOSIS — M25661 Stiffness of right knee, not elsewhere classified: Secondary | ICD-10-CM | POA: Diagnosis not present

## 2022-10-06 NOTE — Telephone Encounter (Signed)
AFT - 25: COMPARING AN OPERATION TO MONITORING, WITH OR WITHOUT ENDOCRINE THERAPY (COMET) FOR LOW RISK DCIS: A PHASE III PROSPECTIVE RANDOMIZED TRIAL  Called patient to let her know that I will be transferring her care to Genella Rife RN at the end of July.  Patient verbalized understanding.  Provided Nikki's contact number.  Confirmed 8/8 mammogram and 8/12 appts.  Patient aware to f/u as needed before her appt, denies any questions/concerns at this time.  Lindsey [name] '[employer]' Dairl Ponder, RN, BSN, [facility] Clinical Research Nurse I 10/06/22 11:17 AM

## 2022-10-08 DIAGNOSIS — M25661 Stiffness of right knee, not elsewhere classified: Secondary | ICD-10-CM | POA: Diagnosis not present

## 2022-10-12 DIAGNOSIS — M25661 Stiffness of right knee, not elsewhere classified: Secondary | ICD-10-CM | POA: Diagnosis not present

## 2022-10-14 DIAGNOSIS — M722 Plantar fascial fibromatosis: Secondary | ICD-10-CM | POA: Diagnosis not present

## 2022-10-22 DIAGNOSIS — M19071 Primary osteoarthritis, right ankle and foot: Secondary | ICD-10-CM | POA: Diagnosis not present

## 2022-10-26 NOTE — Progress Notes (Signed)
Patient Care Team: Pcp, No as PCP - General Thomasene Ripple, DO as PCP - Cardiology (Cardiology) Serena Croissant, MD as Consulting Physician (Hematology and Oncology)  DIAGNOSIS:  Encounter Diagnosis  Name Primary?   Ductal carcinoma in situ (DCIS) of left breast Yes    SUMMARY OF ONCOLOGIC HISTORY: Oncology History  Ductal carcinoma in situ (DCIS) of left breast  11/21/2018 Initial Diagnosis   Routine screening mammogram detected calcifications in the left breast. Biopsy showed low-grade DCIS, ER+ 100%, PR+ 100%.      CHIEF COMPLIANT: Follow-up of DCIS on Comet clinical trial     INTERVAL HISTORY: Lindsey [name] is a 67 year old with above-mentioned history of low-grade DCIS who is currently on Comet clinical trial and is on surveillance. Patient reports that she had a boil on mid back. Bra could be irritating it more. She had a knee replacement in May. Denies any pain or discomfort in breast   ALLERGIES:  is allergic to lactose intolerance (gi), sulfa antibiotics, and amoxicillin.  MEDICATIONS:  Current Outpatient Medications  Medication Sig Dispense Refill   BIOTIN PO Take 1 each by mouth daily.     Cholecalciferol (VITAMIN D3) 125 MCG (5000 UT) CAPS Take 1 capsule by mouth daily.     diphenhydrAMINE (BENADRYL) 25 mg capsule Take 25 mg by mouth every 6 (six) hours as needed. Patient takes 1/2 tablet as needed     fluticasone (FLONASE) 50 MCG/ACT nasal spray Place 1 spray into both nostrils daily.  2   Multiple Vitamins-Minerals (WOMENS MULTIVITAMIN PLUS PO) Take 1 tablet by mouth daily.     oxyCODONE (OXY IR/ROXICODONE) 5 MG immediate release tablet Take 5 mg by mouth every 8 (eight) hours as needed.     promethazine (PHENERGAN) 12.5 MG tablet Take 12.5 mg by mouth every 6 (six) hours as needed.     No current facility-administered medications for this visit.    PHYSICAL EXAMINATION: ECOG PERFORMANCE STATUS: 1 - Symptomatic but completely ambulatory  Vitals:   11/01/22  1338  BP: (!) 143/64  Pulse: 67  Resp: 17  Temp: 97.7 F (36.5 C)  SpO2: 97%   Filed Weights   11/01/22 1338  Weight: 237 lb 1.6 oz (107.5 kg)     LABORATORY DATA:  I have reviewed the data as listed    Latest Ref Rng & Units 05/03/2022   10:18 AM 02/22/2022   12:12 PM 08/20/2020    1:25 PM  CMP  Glucose 70 - 99 mg/dL 77  96  84   BUN 8 - 23 mg/dL 19  19  22    Creatinine 0.44 - 1.00 mg/dL 1.61  0.96  0.45   Sodium 135 - 145 mmol/L 140  144  145   Potassium 3.5 - 5.1 mmol/L 3.9  4.9  4.4   Chloride 98 - 111 mmol/L 106  104  104   CO2 22 - 32 mmol/L 29  27  24    Calcium 8.9 - 10.3 mg/dL 9.6  40.9  9.9   Total Protein 6.5 - 8.1 g/dL 6.9   6.9   Total Bilirubin 0.3 - 1.2 mg/dL 1.1   0.6   Alkaline Phos 38 - 126 U/L 63   78   AST 15 - 41 U/L 18   19   ALT 0 - 44 U/L 15   14     Lab Results  Component Value Date   WBC 5.6 05/03/2022   HGB 12.5 05/03/2022   HCT 36.3 05/03/2022  MCV 90.1 05/03/2022   PLT 277 05/03/2022   NEUTROABS 3.6 05/03/2022    ASSESSMENT & PLAN:  Ductal carcinoma in situ (DCIS) of left breast 11/21/2018: Screening detected left breast mammogram revealing calcifications: Biopsy revealed low-grade DCIS ER 100%, PR 100% Tis NX stage 0   Treatment plan: Comet clinical trial participation (active monitoring arm)   Breast cancer surveillance: Mammogram at [facility] 10/21/2021: Stable appearance of the left breast of previous biopsy-proven DCIS. (Suspicious mass left breast.  Stereotactic biopsy 04/30/2021: Fibrocystic change with calcifications)   Breast exam 05/03/22: Benign Mammogram 04/28/2022: Benign stable scapular calcifications  Mammogram 10/28/2022: Benign   Current treatment: Patient decided not to take tamoxifen therapy.   Return to clinic in 6 months for follow-ups    Orders Placed This Encounter  Procedures   MM DIAG BREAST TOMO BILATERAL    Standing Status:   Future    Standing Expiration Date:   11/01/2023    Order Specific Question:   Reason  for Exam (SYMPTOM  OR DIAGNOSIS REQUIRED)    Answer:   annual mammogram    Order Specific Question:   Preferred imaging location?    Answer:   External    Comments:   [facility]   The patient has a good understanding of the overall plan. she agrees with it. she will call with any problems that may develop before the next visit here. Total time spent: 30 mins including face to face time and time spent for planning, charting and co-ordination of care   Tamsen Meek, MD 11/01/22    I Janan Ridge am acting as a Neurosurgeon for [employer]  I have reviewed the above documentation for accuracy and completeness, and I agree with the above.

## 2022-10-27 DIAGNOSIS — M19012 Primary osteoarthritis, left shoulder: Secondary | ICD-10-CM | POA: Diagnosis not present

## 2022-10-28 DIAGNOSIS — Z853 Personal history of malignant neoplasm of breast: Secondary | ICD-10-CM | POA: Diagnosis not present

## 2022-10-28 DIAGNOSIS — D0512 Intraductal carcinoma in situ of left breast: Secondary | ICD-10-CM | POA: Diagnosis not present

## 2022-11-01 ENCOUNTER — Other Ambulatory Visit: Payer: Self-pay

## 2022-11-01 ENCOUNTER — Encounter: Payer: Medicare PPO | Admitting: Emergency Medicine

## 2022-11-01 ENCOUNTER — Inpatient Hospital Stay: Payer: Medicare PPO

## 2022-11-01 ENCOUNTER — Encounter: Payer: Self-pay | Admitting: *Deleted

## 2022-11-01 ENCOUNTER — Inpatient Hospital Stay: Payer: Medicare PPO | Attending: Hematology and Oncology | Admitting: Hematology and Oncology

## 2022-11-01 VITALS — BP 143/64 | HR 67 | Temp 97.7°F | Resp 17 | Wt 237.1 lb

## 2022-11-01 DIAGNOSIS — Z170 Estrogen receptor positive status [ER+]: Secondary | ICD-10-CM | POA: Insufficient documentation

## 2022-11-01 DIAGNOSIS — D0512 Intraductal carcinoma in situ of left breast: Secondary | ICD-10-CM

## 2022-11-01 DIAGNOSIS — D0511 Intraductal carcinoma in situ of right breast: Secondary | ICD-10-CM | POA: Diagnosis not present

## 2022-11-01 LAB — RESEARCH LABS

## 2022-11-01 NOTE — Assessment & Plan Note (Signed)
11/21/2018: Screening detected left breast mammogram revealing calcifications: Biopsy revealed low-grade DCIS ER 100%, PR 100% Tis NX stage 0   Treatment plan: Comet clinical trial participation (active monitoring arm)   Breast cancer surveillance: Mammogram at [facility] 10/21/2021: Stable appearance of the left breast of previous biopsy-proven DCIS. (Suspicious mass left breast.  Stereotactic biopsy 04/30/2021: Fibrocystic change with calcifications)   Breast exam 05/03/22: Benign Mammogram 04/28/2022: Benign stable scapular calcifications    Current treatment: Patient decided not to take tamoxifen therapy.   Return to clinic in 6 months for follow-ups

## 2022-11-01 NOTE — Research (Unsigned)
AFT - 25: COMPARING AN OPERATION TO MONITORING, WITH OR WITHOUT ENDOCRINE THERAPY (COMET) FOR LOW RISK DCIS: A PHASE III PROSPECTIVE RANDOMIZED TRIAL   Month 48 (ACTIVE MONITORING arm)-Patient was into the cancer center this afternoon unaccompanied for her on-study appointment with Dr. Pamelia Hoit. The pt denies any COVID infections since her last visit.     H&P: Dr. Pamelia Hoit examined patient.  Please see MD's notes from today.     Mammogram: Patient's bilateral breast mammogram was done on 10/28/22.  Report stated "stable site of biopsy-proven DCIS in left breast".  Nurse spoke to [facility], and Dr. Billey Co confirmed location of DCIS at the 2 o'clock position for study purposes.  The MD was asked to do an addendum and add the clock face to the pt's 10/28/22 mammogram report.     Questionnaires: Month 48 questionnaires will be due in 02/13/2023.  The pt was asked to complete these questionnaires in a timely manner.  The pt was informed about additional questionnaires for 39-months (6 year), 78-months (8 year), and 77-months (10 year).    Research Labs:  The pt's month 48 research labs were drawn today and shipped.    Medication Review: Patient reviewed and verified her current medication list is correct.  Patient is not taking any endocrine therapy.    Study Solicited Adverse Events:  The research nurse reviewed the COMET list of solicited Adverse Events with the pt.  Dr. Pamelia Hoit provided attributions to the AE's on 11/02/22.  See below.    Study/Protocol: AFT-25 COMET/48 month visit   Grade 0 toxicities include the following: allergic reaction, fever, hot flashes, myalgia, and fracture.     Not Evaluated toxicities include the following: acute coronary syndrome, ischemia cerebrovascular, osteoporosis, and cholesterol high   Event Grade Onset Date Resolved Date Attribution Treatment Comments  Arthralgia 2 Baseline  Ongoing  Unrelated Orthopedic follow up, may seek surgical intervention  Baseline  comorbidity- pt complains of left knee and left shoulder issues  HTN 1 Baseline  ongoing Unrelated None    Baseline comorbidity/today's BP 143/64  Nausea  1 11/01/22 ongoing  Unrelated Phenergan prn  Pt reports it is related to her pain medication- Oxy IR.  Pain - knee 2 ~07/2022 Ongoing  Unrelated Oxy IR S/p Right knee replacement May 2024  Breast pain 1 ~09/2020 Resolved  Unrelated  None  MD states no "pain or discomfort in breast"   Alopecia  1 Baseline  Ongoing  Unrelated  None    Skin infection 1 11/01/22 Ongoing  Unrelated Band-aid "Boil on mid-back"    Plan: Patient scheduled to return next February 2025 for her month 54 visit. The pt's next imaging is scheduled for 05/02/23. The pt was encouraged to call Dr. Pamelia Hoit if she has any questions/concerns.  The pt was told how grateful the COMET study is for her participation and contribution to this study.   Janan Ridge RN, BSN, CCRP Clinical Research Nurse Lead 11/01/2022 3:10 PM

## 2022-11-02 DIAGNOSIS — M19071 Primary osteoarthritis, right ankle and foot: Secondary | ICD-10-CM | POA: Diagnosis not present

## 2022-11-02 DIAGNOSIS — M2141 Flat foot [pes planus] (acquired), right foot: Secondary | ICD-10-CM | POA: Diagnosis not present

## 2022-11-03 ENCOUNTER — Encounter: Payer: Self-pay | Admitting: Hematology and Oncology

## 2022-11-04 DIAGNOSIS — M19071 Primary osteoarthritis, right ankle and foot: Secondary | ICD-10-CM | POA: Diagnosis not present

## 2022-11-04 DIAGNOSIS — M25661 Stiffness of right knee, not elsewhere classified: Secondary | ICD-10-CM | POA: Diagnosis not present

## 2022-11-04 DIAGNOSIS — M2141 Flat foot [pes planus] (acquired), right foot: Secondary | ICD-10-CM | POA: Diagnosis not present

## 2022-11-05 DIAGNOSIS — M19071 Primary osteoarthritis, right ankle and foot: Secondary | ICD-10-CM | POA: Diagnosis not present

## 2022-11-05 DIAGNOSIS — M2141 Flat foot [pes planus] (acquired), right foot: Secondary | ICD-10-CM | POA: Diagnosis not present

## 2022-11-05 DIAGNOSIS — M25661 Stiffness of right knee, not elsewhere classified: Secondary | ICD-10-CM | POA: Diagnosis not present

## 2022-11-10 DIAGNOSIS — M2141 Flat foot [pes planus] (acquired), right foot: Secondary | ICD-10-CM | POA: Diagnosis not present

## 2022-11-10 DIAGNOSIS — M19071 Primary osteoarthritis, right ankle and foot: Secondary | ICD-10-CM | POA: Diagnosis not present

## 2022-11-11 DIAGNOSIS — M19071 Primary osteoarthritis, right ankle and foot: Secondary | ICD-10-CM | POA: Diagnosis not present

## 2022-11-11 DIAGNOSIS — M2141 Flat foot [pes planus] (acquired), right foot: Secondary | ICD-10-CM | POA: Diagnosis not present

## 2022-11-17 DIAGNOSIS — M19071 Primary osteoarthritis, right ankle and foot: Secondary | ICD-10-CM | POA: Diagnosis not present

## 2022-11-17 DIAGNOSIS — M2141 Flat foot [pes planus] (acquired), right foot: Secondary | ICD-10-CM | POA: Diagnosis not present

## 2022-11-23 DIAGNOSIS — M19071 Primary osteoarthritis, right ankle and foot: Secondary | ICD-10-CM | POA: Diagnosis not present

## 2022-11-23 DIAGNOSIS — M2141 Flat foot [pes planus] (acquired), right foot: Secondary | ICD-10-CM | POA: Diagnosis not present

## 2022-11-26 DIAGNOSIS — M2141 Flat foot [pes planus] (acquired), right foot: Secondary | ICD-10-CM | POA: Diagnosis not present

## 2022-11-26 DIAGNOSIS — M19071 Primary osteoarthritis, right ankle and foot: Secondary | ICD-10-CM | POA: Diagnosis not present

## 2022-12-03 DIAGNOSIS — M76821 Posterior tibial tendinitis, right leg: Secondary | ICD-10-CM | POA: Diagnosis not present

## 2022-12-27 DIAGNOSIS — M19071 Primary osteoarthritis, right ankle and foot: Secondary | ICD-10-CM | POA: Diagnosis not present

## 2022-12-27 DIAGNOSIS — M2141 Flat foot [pes planus] (acquired), right foot: Secondary | ICD-10-CM | POA: Diagnosis not present

## 2022-12-29 ENCOUNTER — Ambulatory Visit (INDEPENDENT_AMBULATORY_CARE_PROVIDER_SITE_OTHER): Payer: Medicare PPO | Admitting: Family Medicine

## 2022-12-29 ENCOUNTER — Encounter (INDEPENDENT_AMBULATORY_CARE_PROVIDER_SITE_OTHER): Payer: Self-pay | Admitting: Family Medicine

## 2022-12-29 VITALS — BP 126/75 | HR 78 | Temp 98.1°F | Ht 66.0 in | Wt 237.0 lb

## 2022-12-29 DIAGNOSIS — Z6838 Body mass index (BMI) 38.0-38.9, adult: Secondary | ICD-10-CM

## 2022-12-29 DIAGNOSIS — E559 Vitamin D deficiency, unspecified: Secondary | ICD-10-CM

## 2022-12-29 DIAGNOSIS — E669 Obesity, unspecified: Secondary | ICD-10-CM | POA: Diagnosis not present

## 2022-12-29 DIAGNOSIS — R7303 Prediabetes: Secondary | ICD-10-CM | POA: Diagnosis not present

## 2022-12-29 NOTE — Progress Notes (Unsigned)
Chief Complaint:   OBESITY Lindsey [name] is here to discuss her progress with her obesity treatment plan along with follow-up of her obesity related diagnoses. Lindsey [name] is on keeping a food journal and adhering to recommended goals of 1500-1800 calories and 100+ grams of protein and states she is following her eating plan approximately 85% of the time. Lindsey [name] states she is doing 0 minutes 0 times per week.  Today's visit was #: 52 Starting weight: 325 lbs Starting date: 05/25/2017 Today's weight: 237 lbs Today's date: 12/29/2022 Total lbs lost to date: 88 Total lbs lost since last in-office visit: 0  Interim History: Patient has had knee surgery since her last in-office visit. She is doing physical therapy for exercise right now. She has gained some weight and she is working on getting back on track.   Subjective:   1. Prediabetes Patient's A1c has been elevated in the past. She is working on improving with her diet.   2. Vitamin D deficiency Patient is on OTC Vitamin D, with no recent labs. She is at high risk of over-replacement.   Assessment/Plan:   1. Prediabetes Patient will continue with her diet, exercise, and weight loss. We will recheck labs in the next month.   2. Vitamin D deficiency Patient will continue Vitamin D, and we will recheck labs in 1 month.   3. BMI 38.0-38.9,adult  4. Obesity, Beginning BMI 52.46 Lindsey [name] is currently in the action stage of change. As such, her goal is to continue with weight loss efforts. She has agreed to keeping a food journal and adhering to recommended goals of 1500-1800 calories and 100+ grams of protein daily.   Behavioral modification strategies: increasing lean protein intake and meal planning and cooking strategies.  Lindsey [name] has agreed to follow-up with our clinic in 4 weeks. She was informed of the importance of frequent follow-up visits to maximize her success with intensive lifestyle modifications for her multiple health conditions.    Objective:   Blood pressure 126/75, pulse 78, temperature 98.1 F (36.7 C), height 5\' 6"  (1.676 m), weight 237 lb (107.5 kg), SpO2 98%. Body mass index is 38.25 kg/m.  Lab Results  Component Value Date   CREATININE 0.63 05/03/2022   BUN 19 05/03/2022   NA 140 05/03/2022   K 3.9 05/03/2022   CL 106 05/03/2022   CO2 29 05/03/2022   Lab Results  Component Value Date   ALT 15 05/03/2022   AST 18 05/03/2022   ALKPHOS 63 05/03/2022   BILITOT 1.1 05/03/2022   Lab Results  Component Value Date   HGBA1C 5.2 08/20/2020   HGBA1C 5.2 03/26/2019   HGBA1C 5.4 03/09/2018   HGBA1C 5.5 10/12/2017   HGBA1C 5.7 (H) 05/25/2017   Lab Results  Component Value Date   INSULIN 7.3 08/20/2020   INSULIN 4.4 03/26/2019   INSULIN 10.0 03/09/2018   INSULIN 19.1 10/12/2017   INSULIN 44.2 (H) 05/25/2017   Lab Results  Component Value Date   TSH 2.88 09/19/2020   Lab Results  Component Value Date   CHOL 178 09/24/2020   HDL 76 (A) 09/24/2020   LDLCALC 92 09/24/2020   TRIG 50 09/19/2020   Lab Results  Component Value Date   VD25OH 69.5 08/20/2020   VD25OH 64.7 06/26/2019   VD25OH 56.6 03/26/2019   Lab Results  Component Value Date   WBC 5.6 05/03/2022   HGB 12.5 05/03/2022   HCT 36.3 05/03/2022   MCV 90.1 05/03/2022   PLT 277 05/03/2022  Lab Results  Component Value Date   FERRITIN 102.0 09/19/2020   Attestation Statements:   Reviewed by clinician on day of visit: allergies, medications, problem list, medical history, surgical history, family history, social history, and previous encounter notes.  Time spent on visit including pre-visit chart review and post-visit care and charting was 35 minutes.   I, Burt Knack, am acting as transcriptionist for Quillian Quince, MD.  I have reviewed the above documentation for accuracy and completeness, and I agree with the above. -  Quillian Quince, MD

## 2023-01-02 LAB — FECAL OCCULT BLOOD, GUAIAC: Fecal Occult Blood: NEGATIVE

## 2023-01-03 DIAGNOSIS — M2141 Flat foot [pes planus] (acquired), right foot: Secondary | ICD-10-CM | POA: Diagnosis not present

## 2023-01-03 DIAGNOSIS — M19071 Primary osteoarthritis, right ankle and foot: Secondary | ICD-10-CM | POA: Diagnosis not present

## 2023-01-05 DIAGNOSIS — M2141 Flat foot [pes planus] (acquired), right foot: Secondary | ICD-10-CM | POA: Diagnosis not present

## 2023-01-05 DIAGNOSIS — M19071 Primary osteoarthritis, right ankle and foot: Secondary | ICD-10-CM | POA: Diagnosis not present

## 2023-01-05 DIAGNOSIS — H2513 Age-related nuclear cataract, bilateral: Secondary | ICD-10-CM | POA: Diagnosis not present

## 2023-01-05 DIAGNOSIS — H52203 Unspecified astigmatism, bilateral: Secondary | ICD-10-CM | POA: Diagnosis not present

## 2023-01-05 DIAGNOSIS — H5213 Myopia, bilateral: Secondary | ICD-10-CM | POA: Diagnosis not present

## 2023-01-10 DIAGNOSIS — M19071 Primary osteoarthritis, right ankle and foot: Secondary | ICD-10-CM | POA: Diagnosis not present

## 2023-01-10 DIAGNOSIS — M2141 Flat foot [pes planus] (acquired), right foot: Secondary | ICD-10-CM | POA: Diagnosis not present

## 2023-01-13 DIAGNOSIS — M19071 Primary osteoarthritis, right ankle and foot: Secondary | ICD-10-CM | POA: Diagnosis not present

## 2023-01-13 DIAGNOSIS — M2141 Flat foot [pes planus] (acquired), right foot: Secondary | ICD-10-CM | POA: Diagnosis not present

## 2023-01-17 DIAGNOSIS — M2141 Flat foot [pes planus] (acquired), right foot: Secondary | ICD-10-CM | POA: Diagnosis not present

## 2023-01-17 DIAGNOSIS — M19071 Primary osteoarthritis, right ankle and foot: Secondary | ICD-10-CM | POA: Diagnosis not present

## 2023-01-20 DIAGNOSIS — M19071 Primary osteoarthritis, right ankle and foot: Secondary | ICD-10-CM | POA: Diagnosis not present

## 2023-01-20 DIAGNOSIS — M2141 Flat foot [pes planus] (acquired), right foot: Secondary | ICD-10-CM | POA: Diagnosis not present

## 2023-01-26 ENCOUNTER — Encounter (INDEPENDENT_AMBULATORY_CARE_PROVIDER_SITE_OTHER): Payer: Self-pay | Admitting: Family Medicine

## 2023-01-27 DIAGNOSIS — Z13220 Encounter for screening for lipoid disorders: Secondary | ICD-10-CM | POA: Diagnosis not present

## 2023-01-27 DIAGNOSIS — Z23 Encounter for immunization: Secondary | ICD-10-CM | POA: Diagnosis not present

## 2023-01-27 DIAGNOSIS — M19071 Primary osteoarthritis, right ankle and foot: Secondary | ICD-10-CM | POA: Diagnosis not present

## 2023-01-27 DIAGNOSIS — Z6841 Body Mass Index (BMI) 40.0 and over, adult: Secondary | ICD-10-CM | POA: Diagnosis not present

## 2023-01-27 DIAGNOSIS — Z0000 Encounter for general adult medical examination without abnormal findings: Secondary | ICD-10-CM | POA: Diagnosis not present

## 2023-01-27 DIAGNOSIS — Z131 Encounter for screening for diabetes mellitus: Secondary | ICD-10-CM | POA: Diagnosis not present

## 2023-01-27 DIAGNOSIS — M2141 Flat foot [pes planus] (acquired), right foot: Secondary | ICD-10-CM | POA: Diagnosis not present

## 2023-02-02 ENCOUNTER — Ambulatory Visit (INDEPENDENT_AMBULATORY_CARE_PROVIDER_SITE_OTHER): Payer: Medicare PPO | Admitting: Family Medicine

## 2023-02-03 DIAGNOSIS — M2141 Flat foot [pes planus] (acquired), right foot: Secondary | ICD-10-CM | POA: Diagnosis not present

## 2023-02-03 DIAGNOSIS — M19071 Primary osteoarthritis, right ankle and foot: Secondary | ICD-10-CM | POA: Diagnosis not present

## 2023-02-08 DIAGNOSIS — M19071 Primary osteoarthritis, right ankle and foot: Secondary | ICD-10-CM | POA: Diagnosis not present

## 2023-02-08 DIAGNOSIS — M2141 Flat foot [pes planus] (acquired), right foot: Secondary | ICD-10-CM | POA: Diagnosis not present

## 2023-02-10 DIAGNOSIS — M19071 Primary osteoarthritis, right ankle and foot: Secondary | ICD-10-CM | POA: Diagnosis not present

## 2023-02-10 DIAGNOSIS — M2141 Flat foot [pes planus] (acquired), right foot: Secondary | ICD-10-CM | POA: Diagnosis not present

## 2023-02-16 DIAGNOSIS — M19071 Primary osteoarthritis, right ankle and foot: Secondary | ICD-10-CM | POA: Diagnosis not present

## 2023-02-16 DIAGNOSIS — M2141 Flat foot [pes planus] (acquired), right foot: Secondary | ICD-10-CM | POA: Diagnosis not present

## 2023-02-21 DIAGNOSIS — M19071 Primary osteoarthritis, right ankle and foot: Secondary | ICD-10-CM | POA: Diagnosis not present

## 2023-02-21 DIAGNOSIS — M2141 Flat foot [pes planus] (acquired), right foot: Secondary | ICD-10-CM | POA: Diagnosis not present

## 2023-02-28 DIAGNOSIS — M19071 Primary osteoarthritis, right ankle and foot: Secondary | ICD-10-CM | POA: Diagnosis not present

## 2023-02-28 DIAGNOSIS — M2141 Flat foot [pes planus] (acquired), right foot: Secondary | ICD-10-CM | POA: Diagnosis not present

## 2023-03-03 DIAGNOSIS — M1712 Unilateral primary osteoarthritis, left knee: Secondary | ICD-10-CM | POA: Diagnosis not present

## 2023-03-28 ENCOUNTER — Ambulatory Visit (INDEPENDENT_AMBULATORY_CARE_PROVIDER_SITE_OTHER): Payer: Medicare PPO | Admitting: Family Medicine

## 2023-03-28 ENCOUNTER — Encounter (INDEPENDENT_AMBULATORY_CARE_PROVIDER_SITE_OTHER): Payer: Self-pay | Admitting: Family Medicine

## 2023-03-28 VITALS — BP 160/85 | HR 94 | Ht 66.0 in | Wt 250.0 lb

## 2023-03-28 DIAGNOSIS — Z6841 Body Mass Index (BMI) 40.0 and over, adult: Secondary | ICD-10-CM | POA: Insufficient documentation

## 2023-03-28 DIAGNOSIS — M25512 Pain in left shoulder: Secondary | ICD-10-CM | POA: Diagnosis not present

## 2023-03-28 DIAGNOSIS — E669 Obesity, unspecified: Secondary | ICD-10-CM | POA: Diagnosis not present

## 2023-03-28 DIAGNOSIS — I10 Essential (primary) hypertension: Secondary | ICD-10-CM

## 2023-03-28 DIAGNOSIS — G8929 Other chronic pain: Secondary | ICD-10-CM | POA: Insufficient documentation

## 2023-03-28 DIAGNOSIS — F5089 Other specified eating disorder: Secondary | ICD-10-CM

## 2023-03-28 DIAGNOSIS — F3289 Other specified depressive episodes: Secondary | ICD-10-CM

## 2023-03-28 NOTE — Progress Notes (Signed)
 .smr  Office: [phone]  /  Fax: [phone]  WEIGHT SUMMARY AND BIOMETRICS  Anthropometric Measurements Height: 5' 6 (1.676 m) Weight: 250 lb (113.4 kg) BMI (Calculated): 40.37 Weight at Last Visit: 237 lb Weight Lost Since Last Visit: 0 Weight Gained Since Last Visit: 13 lb Starting Weight: 325 lb Total Weight Loss (lbs): 75 lb (34 kg)   Body Composition  Body Fat %: 54.6 % Fat Mass (lbs): 137 lbs Muscle Mass (lbs): 108 lbs Visceral Fat Rating : 18   Other Clinical Data Fasting: No Labs: No Today's Visit #: 77 Starting Date: 05/25/17    Chief Complaint: OBESITY   History of Present Illness   The patient, with a history of obesity and hypertension, presents for a follow-up visit after a three-month interval. She reports a significant weight gain of approximately 30 pounds over the past year, with a recent increase of 13 pounds in the last three months. The patient acknowledges a lack of exercise and dietary control during this period. She also reports an elevated blood pressure reading of 160/85, and is currently not on any antihypertensive medications.  The patient recently experienced a severe cold over the holiday season, which led to the cancellation of her plans. The primary residual symptoms include a productive cough and throat congestion. She tested negative for COVID-19 during this illness.  The patient expresses a desire to restart her weight loss journey but struggles with carbohydrate cravings and emotional eating behaviors. She is hesitant to use weight loss medications due to potential side effects. She also reports a lack of motivation and difficulty focusing, which she attributes to excessive screen time.  In addition to her weight concerns, the patient has been dealing with left shoulder pain due to a lack of cartilage in the joint, which has been diagnosed as requiring a shoulder replacement. She expresses apprehension about the procedure and its  recovery process. The patient also mentions a recent knee replacement, which had a successful outcome and recovery.  The patient lives alone and reports some difficulty managing her health issues independently. She also expresses financial concerns related to an upcoming plumbing repair, which may be contributing to her elevated blood pressure.          PHYSICAL EXAM:  Blood pressure (!) 160/85, pulse 94, height 5' 6 (1.676 m), weight 250 lb (113.4 kg), SpO2 98%. Body mass index is 40.35 kg/m.  DIAGNOSTIC DATA REVIEWED:  BMET    Component Value Date/Time   NA 140 05/03/2022 1018   NA 144 02/22/2022 1212   K 3.9 05/03/2022 1018   CL 106 05/03/2022 1018   CO2 29 05/03/2022 1018   GLUCOSE 77 05/03/2022 1018   BUN 19 05/03/2022 1018   BUN 19 02/22/2022 1212   CREATININE 0.63 05/03/2022 1018   CALCIUM 9.6 05/03/2022 1018   GFRNONAA >60 05/03/2022 1018   GFRAA 114 03/26/2019 1140   Lab Results  Component Value Date   HGBA1C 5.2 08/20/2020   HGBA1C 5.7 (H) 05/25/2017   Lab Results  Component Value Date   INSULIN  7.3 08/20/2020   INSULIN  44.2 (H) 05/25/2017   Lab Results  Component Value Date   TSH 2.88 09/19/2020   CBC    Component Value Date/Time   WBC 5.6 05/03/2022 1018   RBC 4.03 05/03/2022 1018   HGB 12.5 05/03/2022 1018   HGB 13.6 05/25/2017 1147   HCT 36.3 05/03/2022 1018   HCT 40.7 05/25/2017 1147   PLT 277 05/03/2022 1018   MCV  90.1 05/03/2022 1018   MCV 91 05/25/2017 1147   MCH 31.0 05/03/2022 1018   MCHC 34.4 05/03/2022 1018   RDW 12.3 05/03/2022 1018   RDW 13.9 05/25/2017 1147   Iron Studies    Component Value Date/Time   FERRITIN 102.0 09/19/2020 0000   Lipid Panel     Component Value Date/Time   CHOL 178 09/24/2020 0000   CHOL 159 03/26/2019 1140   TRIG 50 09/19/2020 0000   HDL 76 (A) 09/24/2020 0000   HDL 66 03/26/2019 1140   LDLCALC 92 09/24/2020 0000   LDLCALC 81 03/26/2019 1140   Hepatic Function Panel     Component Value  Date/Time   PROT 6.9 05/03/2022 1018   PROT 6.9 08/20/2020 1325   ALBUMIN 4.2 05/03/2022 1018   ALBUMIN 4.5 08/20/2020 1325   AST 18 05/03/2022 1018   ALT 15 05/03/2022 1018   ALKPHOS 63 05/03/2022 1018   BILITOT 1.1 05/03/2022 1018      Component Value Date/Time   TSH 2.88 09/19/2020 0000   TSH 2.070 05/25/2017 1147   Nutritional Lab Results  Component Value Date   VD25OH 69.5 08/20/2020   VD25OH 64.7 06/26/2019   VD25OH 56.6 03/26/2019     Assessment and Plan    Left Shoulder Pain Left shoulder pain with no cartilage, described as bone on bone. Pain is worse at night and affects sleep. Discussed potential benefits and recovery expectations of shoulder replacement surgery, including faster healing due to better blood supply. Emphasized the importance of passive exercises to avoid frozen shoulder. - Recommend passive exercises to avoid frozen shoulder - Offer referral to physical therapy if needed - Discuss potential benefits and recovery expectations of shoulder replacement surgery  Obesity Gained 13 pounds in the last three months and approximately 30 pounds in the past year. Not currently exercising or journaling. Discussed the addictive nature of carbohydrates and the benefits of a low-carb diet to reset cravings. Prefers to avoid medications due to potential side effects. Emphasized addressing emotional eating behaviors and finding alternative activities to raise neurotransmitters. - Provide low-carb diet plan focusing on meat and green vegetables - Encourage avoiding starchy vegetables and dairy - Advise avoiding emotional eating and finding alternative activities to raise neurotransmitters  Emotional Eating Behaviors Engages in emotional eating, especially during shorter days. Recognizes the impact but struggles to find productive activities. Discussed the importance of finding hobbies or activities that raise neurotransmitters and setting a timer to limit non-productive  activities. - Encourage finding hobbies or activities that raise neurotransmitters - Suggest setting a timer to limit non-productive activities - Recommend exploring creative activities like sewing or making placemats  Hypertension Blood pressure elevated at 160/85 mmHg. Not currently on medications. Elevated blood pressure may be influenced by stress and recent phone call. Discussed the importance of monitoring blood pressure and reassessing at the next visit. - Monitor blood pressure - Reassess blood pressure at next visit  Follow-up - Schedule follow-up appointment in 4-5 weeks.         I have personally spent 35 minutes total time today in preparation, patient care, and documentation for this visit, including the following: review of clinical lab tests; review of medical tests/procedures/services.    She was informed of the importance of frequent follow up visits to maximize her success with intensive lifestyle modifications for her multiple health conditions.    Louann Penton, MD

## 2023-04-28 ENCOUNTER — Ambulatory Visit (INDEPENDENT_AMBULATORY_CARE_PROVIDER_SITE_OTHER): Payer: Medicare PPO | Admitting: Family Medicine

## 2023-05-02 DIAGNOSIS — Z08 Encounter for follow-up examination after completed treatment for malignant neoplasm: Secondary | ICD-10-CM | POA: Diagnosis not present

## 2023-05-02 DIAGNOSIS — Z853 Personal history of malignant neoplasm of breast: Secondary | ICD-10-CM | POA: Diagnosis not present

## 2023-05-03 ENCOUNTER — Inpatient Hospital Stay: Payer: Medicare PPO | Attending: Hematology and Oncology | Admitting: Hematology and Oncology

## 2023-05-03 ENCOUNTER — Encounter: Payer: Self-pay | Admitting: Hematology and Oncology

## 2023-05-03 ENCOUNTER — Other Ambulatory Visit: Payer: Self-pay | Admitting: *Deleted

## 2023-05-03 ENCOUNTER — Encounter: Payer: Self-pay | Admitting: *Deleted

## 2023-05-03 VITALS — BP 167/69 | Temp 98.0°F | Wt 261.7 lb

## 2023-05-03 DIAGNOSIS — Z006 Encounter for examination for normal comparison and control in clinical research program: Secondary | ICD-10-CM | POA: Diagnosis not present

## 2023-05-03 DIAGNOSIS — Z9012 Acquired absence of left breast and nipple: Secondary | ICD-10-CM | POA: Insufficient documentation

## 2023-05-03 DIAGNOSIS — D0512 Intraductal carcinoma in situ of left breast: Secondary | ICD-10-CM

## 2023-05-03 DIAGNOSIS — Z86000 Personal history of in-situ neoplasm of breast: Secondary | ICD-10-CM | POA: Insufficient documentation

## 2023-05-03 NOTE — Research (Signed)
AFT - 25: COMPARING AN OPERATION TO MONITORING, WITH OR WITHOUT ENDOCRINE THERAPY (COMET) FOR LOW RISK DCIS: A PHASE III PROSPECTIVE RANDOMIZED TRIAL   Month 54 (ACTIVE MONITORING arm)-  Patient was into the cancer center this afternoon unaccompanied for her on-study appointment with Dr. Pamelia Hoit. The pt denies any COVID infections since her last visit.     H&P: Dr. Pamelia Hoit met with the patient and reviewed her recent mammogram findings.  Please see MD's notes from today.     Mammogram: Patient's unilateral left mammogram was done on 05/02/23.  Radiologist stated pt's "previously seen calcifications at this site are not definitely seen on today's images".    Questionnaires: The pt was thanked for completing her month 48 paper questionnaires.  The pt was informed that her next questionnaires for Month 60 will be due in November 2025.  The pt was also informed about additional study questionnaires for 54-months (6 year), 58-months (8 year), and 77-months (10 year).     Research Labs:  No research samples were obtained today.  The pt was informed that her month 76 research samples will be drawn at her next visit.     Medication Review: Patient reviewed and verified her current medication list is correct.  Patient is not taking any endocrine therapy.    Study Solicited Adverse Events:  The research nurse reviewed the COMET list of solicited Adverse Events with the pt.  Dr. Pamelia Hoit provided attributions to the AE's on 05/03/23.  See below.    Study/Protocol: AFT-25 COMET/54 month visit   Grade 0 toxicities include the following: allergic reaction, hot flashes, nausea and fracture.     Not Evaluated toxicities include the following: acute coronary syndrome, ischemia cerebrovascular, osteoporosis, and cholesterol high   Event Grade Onset Date Resolved Date Attribution Treatment Comments  Arthralgia 2 Baseline  Ongoing  Unrelated may seek surgical intervention in the future Baseline comorbidity- pt  complains of ongoing left knee and left shoulder issues  Fever  1 Unknown Unknown Unrelated None  Pt reported a fever from a cold in the fall.    HTN 1 Baseline  ongoing Unrelated None    Baseline comorbidity/today's BP 167/69  Breast pain 1 05/03/23 Ongoing   Unrelated  None  Pt states "comes and goes". Mild in nature  Alopecia  1 Baseline  Ongoing  Unrelated  None   No change  Myalgia  1 05/03/23 Ongoing  Unrelated  None  Muscular back pain noted by patient    Pt states her nausea, skin infection (boil), and knee pain reported at her last visit have all resolved.  Plan: Patient scheduled to return on August 18,2025 for her month 60 visit. The pt's next bilateral imaging is scheduled for 10/30/23. The pt was encouraged to call Dr. Pamelia Hoit if she has any questions/concerns.  The pt was told how grateful the COMET study is for her participation and contribution to this study.   Janan Ridge RN, BSN, CCRP Clinical Research Nurse Lead 05/03/2023 3:15 PM

## 2023-05-03 NOTE — Assessment & Plan Note (Signed)
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 (Suspicious mass left breast.  Stereotactic biopsy 04/30/2021: Fibrocystic change with calcifications)   Breast cancer surveillance: Breast exam 05/03/2023: Benign Mammogram 04/28/2022: Benign stable scapular calcifications  Mammogram 10/28/2022: At [facility] benign   Current treatment: Patient decided not to take tamoxifen therapy.   Return to clinic in 1 year for follow-ups

## 2023-05-03 NOTE — Progress Notes (Signed)
Patient Care Team: Pcp, No as PCP - General Thomasene Ripple, DO as PCP - Cardiology (Cardiology) Serena Croissant, MD as Consulting Physician (Hematology and Oncology)  DIAGNOSIS:  Encounter Diagnosis  Name Primary?   Ductal carcinoma in situ (DCIS) of left breast Yes    SUMMARY OF ONCOLOGIC HISTORY: Oncology History  Ductal carcinoma in situ (DCIS) of left breast  11/21/2018 Initial Diagnosis   Routine screening mammogram detected calcifications in the left breast. Biopsy showed low-grade DCIS, ER+ 100%, PR+ 100%.      CHIEF COMPLIANT: Follow-up of history of DCIS after recent mammogram  HISTORY OF PRESENT ILLNESS:  History of Present Illness   Lindsey [name] is a 68 year old female who presents for follow-up after a recent mammogram.  She has been under surveillance for breast calcifications since September 2020. Her recent mammogram showed that the previously noted calcifications are no longer visible. After a biopsy, a marker was placed, and she believes the calcifications have not been seen since then. Her mammogram from February 2024 noted stable calcifications, but the current imaging shows they are no longer present. She has been on a schedule of six-month mammograms.  She underwent a diagnostic mammogram recently, which involved imaging only the left side, and plans to have a bilateral mammogram in August. She prefers to maintain the diagnostic status of her mammograms rather than switching to screening, as she has not had surgery that would warrant a change in classification.  She has a history of shoulder pain on the side where she had a mastectomy, lymph node dissection, and lymphedema. She has been on methadone for pain management since 2013, but there have been challenges in continuing this medication due to a positive urine screen for cocaine and difficulties in obtaining prescriptions from pain management clinics.         ALLERGIES:  is allergic to lactose intolerance  (gi), sulfa antibiotics, and amoxicillin.  MEDICATIONS:  Current Outpatient Medications  Medication Sig Dispense Refill   BIOTIN PO Take 1 each by mouth daily.     Cholecalciferol (VITAMIN D3) 125 MCG (5000 UT) CAPS Take 1 capsule by mouth daily.     diphenhydrAMINE (BENADRYL) 25 mg capsule Take 25 mg by mouth every 6 (six) hours as needed. Patient takes 1/2 tablet as needed     fluticasone (FLONASE) 50 MCG/ACT nasal spray Place 1 spray into both nostrils daily.  2   Multiple Vitamins-Minerals (WOMENS MULTIVITAMIN PLUS PO) Take 1 tablet by mouth daily.     UNABLE TO FIND Med Name: THC/CBD 15/15 mg     No current facility-administered medications for this visit.    PHYSICAL EXAMINATION: ECOG PERFORMANCE STATUS: 1 - Symptomatic but completely ambulatory  Vitals:   05/03/23 1404  BP: (!) 167/69  Temp: 98 F (36.7 C)  SpO2: 100%   Filed Weights   05/03/23 1404  Weight: 261 lb 11.2 oz (118.7 kg)    LABORATORY DATA:  I have reviewed the data as listed    Latest Ref Rng & Units 05/03/2022   10:18 AM 02/22/2022   12:12 PM 08/20/2020    1:25 PM  CMP  Glucose 70 - 99 mg/dL 77  96  84   BUN 8 - 23 mg/dL 19  19  22    Creatinine 0.44 - 1.00 mg/dL 5.40  9.81  1.91   Sodium 135 - 145 mmol/L 140  144  145   Potassium 3.5 - 5.1 mmol/L 3.9  4.9  4.4  Chloride 98 - 111 mmol/L 106  104  104   CO2 22 - 32 mmol/L 29  27  24    Calcium 8.9 - 10.3 mg/dL 9.6  16.1  9.9   Total Protein 6.5 - 8.1 g/dL 6.9   6.9   Total Bilirubin 0.3 - 1.2 mg/dL 1.1   0.6   Alkaline Phos 38 - 126 U/L 63   78   AST 15 - 41 U/L 18   19   ALT 0 - 44 U/L 15   14     Lab Results  Component Value Date   WBC 5.6 05/03/2022   HGB 12.5 05/03/2022   HCT 36.3 05/03/2022   MCV 90.1 05/03/2022   PLT 277 05/03/2022   NEUTROABS 3.6 05/03/2022    ASSESSMENT & PLAN:  Ductal carcinoma in situ (DCIS) of left breast 11/21/2018: Screening detected left breast mammogram revealing calcifications: Biopsy revealed low-grade  DCIS ER 100%, PR 100% Tis NX stage 0   Treatment plan: Comet clinical trial participation (active monitoring arm)   Breast cancer surveillance: Mammogram at [facility] 10/21/2021: Stable appearance of the left breast of previous biopsy-proven DCIS. (Suspicious mass left breast.  Stereotactic biopsy 04/30/2021: Fibrocystic change with calcifications)   Breast cancer surveillance: Breast exam 05/03/2023: Benign Mammogram 04/28/2022: Benign stable scapular calcifications  Mammogram 05/02/2023: At [facility] benign (0.2 cm residual amorphous calcifications seen previously were not seen on today's images)   Current treatment: Patient decided not to take tamoxifen therapy.   Return to clinic in 6 months for follow-up and after that we will see her annually. We will keep the breast mammograms every 6 months on the left breast.   Orders Placed This Encounter  Procedures   MM DIAG BREAST TOMO BILATERAL    Standing Status:   Future    Expiration Date:   05/02/2024    Reason for Exam (SYMPTOM  OR DIAGNOSIS REQUIRED):   annual bilateral mammograms    Preferred imaging location?:   External             [facility]    Release to patient:   Immediate   The patient has a good understanding of the overall plan. she agrees with it. she will call with any problems that may develop before the next visit here. Total time spent: 30 mins including face to face time and time spent for planning, charting and co-ordination of care   Tamsen Meek, MD 05/03/23

## 2023-05-04 ENCOUNTER — Other Ambulatory Visit: Payer: Medicare PPO

## 2023-05-04 ENCOUNTER — Ambulatory Visit: Payer: Medicare PPO | Admitting: Hematology and Oncology

## 2023-05-31 DIAGNOSIS — M545 Low back pain, unspecified: Secondary | ICD-10-CM | POA: Diagnosis not present

## 2023-05-31 DIAGNOSIS — M1712 Unilateral primary osteoarthritis, left knee: Secondary | ICD-10-CM | POA: Diagnosis not present

## 2023-05-31 DIAGNOSIS — M19012 Primary osteoarthritis, left shoulder: Secondary | ICD-10-CM | POA: Diagnosis not present

## 2023-06-01 ENCOUNTER — Ambulatory Visit (INDEPENDENT_AMBULATORY_CARE_PROVIDER_SITE_OTHER): Payer: Medicare PPO | Admitting: Family Medicine

## 2023-06-01 ENCOUNTER — Encounter (INDEPENDENT_AMBULATORY_CARE_PROVIDER_SITE_OTHER): Payer: Self-pay | Admitting: Family Medicine

## 2023-06-01 VITALS — BP 169/93 | HR 77 | Temp 98.0°F | Ht 66.0 in | Wt 257.0 lb

## 2023-06-01 DIAGNOSIS — F418 Other specified anxiety disorders: Secondary | ICD-10-CM

## 2023-06-01 DIAGNOSIS — I10 Essential (primary) hypertension: Secondary | ICD-10-CM

## 2023-06-01 DIAGNOSIS — R7303 Prediabetes: Secondary | ICD-10-CM

## 2023-06-01 DIAGNOSIS — F5089 Other specified eating disorder: Secondary | ICD-10-CM

## 2023-06-01 DIAGNOSIS — Z6841 Body Mass Index (BMI) 40.0 and over, adult: Secondary | ICD-10-CM | POA: Diagnosis not present

## 2023-06-01 DIAGNOSIS — F32A Depression, unspecified: Secondary | ICD-10-CM

## 2023-06-01 DIAGNOSIS — E669 Obesity, unspecified: Secondary | ICD-10-CM

## 2023-06-01 NOTE — Progress Notes (Signed)
 Office: [phone]  /  Fax: [phone]  WEIGHT SUMMARY AND BIOMETRICS  Anthropometric Measurements Height: 5\' 6"  (1.676 m) Weight: 257 lb (116.6 kg) BMI (Calculated): 41.5 Weight at Last Visit: 250 lb Weight Lost Since Last Visit: 0 Weight Gained Since Last Visit: 7 lb Starting Weight: 325 lb Total Weight Loss (lbs): 68 lb (30.8 kg)   Body Composition  Body Fat %: 55.6 % Fat Mass (lbs): 142.8 lbs Muscle Mass (lbs): 108.4 lbs Visceral Fat Rating : 19   Other Clinical Data Fasting: No Labs: No Today's Visit #: 62 Starting Date: 05/25/17    Chief Complaint: OBESITY   Discussed the use of AI scribe software for clinical note transcription with the patient, who gave verbal consent to proceed.  History of Present Illness   The patient presents with obesity and hypertension for a review of her treatment plan.  Over the last two months, she has struggled to maintain a structured eating plan and has not been able to exercise, resulting in a weight gain of seven pounds. She describes her eating habits as 'terrible' and feels ashamed about her lack of progress.  Her blood pressure was elevated today at 172/81, with a recheck showing 169/93. She is not currently on any medications for blood pressure.  She experiences significant discomfort and pain in her legs, feet, and left shoulder, which has limited her ability to exercise, including walking her dog. Previously, she could walk a mile but now struggles to walk a few feet. She received a shot for her knee recently but has not resumed her previous activity levels.  Her sleep has increased from seven to nine hours per night, which she attributes to a lack of motivation to get up. She acknowledges feeling depressed, attributing it to situational factors including pain, social issues, and political concerns.  She has a history of breast cancer and has been under observation for almost five years without any treatment. She is  part of the [place] study, which involves monitoring without active intervention. She continues to have mammograms every six months on the affected side and annual mammograms on both sides.  She discusses her mother's cognitive decline, noting that her brother manages the finances while she handles medical care. Her mother is bowel incontinent and has hygiene issues, which require her intervention during visits.          PHYSICAL EXAM:  Blood pressure (!) 169/93, pulse 77, temperature 98 F (36.7 C), height 5\' 6"  (1.676 m), weight 257 lb (116.6 kg), SpO2 98%. Body mass index is 41.48 kg/m.  DIAGNOSTIC DATA REVIEWED:  BMET    Component Value Date/Time   NA 140 05/03/2022 1018   NA 144 02/22/2022 1212   K 3.9 05/03/2022 1018   CL 106 05/03/2022 1018   CO2 29 05/03/2022 1018   GLUCOSE 77 05/03/2022 1018   BUN 19 05/03/2022 1018   BUN 19 02/22/2022 1212   CREATININE 0.63 05/03/2022 1018   CALCIUM 9.6 05/03/2022 1018   GFRNONAA >60 05/03/2022 1018   GFRAA 114 03/26/2019 1140   Lab Results  Component Value Date   HGBA1C 5.2 08/20/2020   HGBA1C 5.7 (H) 05/25/2017   Lab Results  Component Value Date   INSULIN 7.3 08/20/2020   INSULIN 44.2 (H) 05/25/2017   Lab Results  Component Value Date   TSH 2.88 09/19/2020   CBC    Component Value Date/Time   WBC 5.6 05/03/2022 1018   RBC 4.03 05/03/2022 1018   HGB 12.5  05/03/2022 1018   HGB 13.6 05/25/2017 1147   HCT 36.3 05/03/2022 1018   HCT 40.7 05/25/2017 1147   PLT 277 05/03/2022 1018   MCV 90.1 05/03/2022 1018   MCV 91 05/25/2017 1147   MCH 31.0 05/03/2022 1018   MCHC 34.4 05/03/2022 1018   RDW 12.3 05/03/2022 1018   RDW 13.9 05/25/2017 1147   Iron Studies    Component Value Date/Time   FERRITIN 102.0 09/19/2020 0000   Lipid Panel     Component Value Date/Time   CHOL 178 09/24/2020 0000   CHOL 159 03/26/2019 1140   TRIG 50 09/19/2020 0000   HDL 76 (A) 09/24/2020 0000   HDL 66 03/26/2019 1140   LDLCALC 92  09/24/2020 0000   LDLCALC 81 03/26/2019 1140   Hepatic Function Panel     Component Value Date/Time   PROT 6.9 05/03/2022 1018   PROT 6.9 08/20/2020 1325   ALBUMIN 4.2 05/03/2022 1018   ALBUMIN 4.5 08/20/2020 1325   AST 18 05/03/2022 1018   ALT 15 05/03/2022 1018   ALKPHOS 63 05/03/2022 1018   BILITOT 1.1 05/03/2022 1018      Component Value Date/Time   TSH 2.88 09/19/2020 0000   TSH 2.070 05/25/2017 1147   Nutritional Lab Results  Component Value Date   VD25OH 69.5 08/20/2020   VD25OH 64.7 06/26/2019   VD25OH 56.6 03/26/2019     Assessment and Plan    Obesity She has gained seven pounds over the last two months due to pain, situational depression, and lack of motivation, leading to emotional eating. She acknowledges stress and lack of motivation as contributing factors and does not experience physical hunger. - Provide a structured eating plan focusing on protein intake. - Encourage writing out motivations for change and focusing on the benefits of getting healthier. - Advise concentrating on eating what she should rather than what she shouldn't. - Discuss the option of medication for excessive hunger signals if needed in the future.  Prediabetes Underlying prediabetes contributes to intense hunger signals when blood glucose levels drop, related to obesity and dietary habits. Emphasizing protein intake is crucial to stabilize blood glucose levels and reduce hunger signals. - Emphasize the importance of protein intake to stabilize blood glucose levels and reduce intense hunger signals.  Hypertension Blood pressure is elevated at 172/81 and 169/93. She is not on antihypertensive medications. Stress is suspected to contribute to the elevated blood pressure. She lacks a home blood pressure monitor, which is important for tracking and preventing complications such as stroke. - Recommend obtaining a home blood pressure monitor to track blood pressure levels. - Discuss the  importance of monitoring blood pressure to prevent complications such as stroke.  Depression with EEB She reports situational depression related to pain, social issues, and political concerns, resulting in increased sleep duration and lack of motivation, affecting daily activities and eating habits. - Encourage engaging in activities that bring joy and improve mood. - Discuss the impact of situational factors on mental health and explore coping strategies.         I have personally spent 40 minutes total time today in preparation, patient care, and documentation for this visit, including the following: review of clinical lab tests; review of medical tests/procedures/services.    She was informed of the importance of frequent follow up visits to maximize her success with intensive lifestyle modifications for her multiple health conditions.    Quillian Quince, MD

## 2023-06-08 DIAGNOSIS — M19011 Primary osteoarthritis, right shoulder: Secondary | ICD-10-CM | POA: Diagnosis not present

## 2023-06-15 ENCOUNTER — Encounter (HOSPITAL_COMMUNITY): Payer: Self-pay

## 2023-06-15 ENCOUNTER — Emergency Department (HOSPITAL_COMMUNITY)
Admission: EM | Admit: 2023-06-15 | Discharge: 2023-06-15 | Disposition: A | Discharge status: Home / Self Care | Attending: Emergency Medicine | Admitting: Emergency Medicine

## 2023-06-15 ENCOUNTER — Emergency Department (HOSPITAL_COMMUNITY)

## 2023-06-15 ENCOUNTER — Other Ambulatory Visit: Payer: Self-pay

## 2023-06-15 ENCOUNTER — Ambulatory Visit (HOSPITAL_COMMUNITY)
Admission: EM | Admit: 2023-06-15 | Discharge: 2023-06-15 | Disposition: A | Discharge status: Home / Self Care | Attending: Physician Assistant | Admitting: Physician Assistant

## 2023-06-15 DIAGNOSIS — R079 Chest pain, unspecified: Secondary | ICD-10-CM

## 2023-06-15 DIAGNOSIS — K5730 Diverticulosis of large intestine without perforation or abscess without bleeding: Secondary | ICD-10-CM | POA: Diagnosis not present

## 2023-06-15 DIAGNOSIS — R1012 Left upper quadrant pain: Secondary | ICD-10-CM | POA: Diagnosis not present

## 2023-06-15 DIAGNOSIS — R109 Unspecified abdominal pain: Secondary | ICD-10-CM

## 2023-06-15 DIAGNOSIS — R0989 Other specified symptoms and signs involving the circulatory and respiratory systems: Secondary | ICD-10-CM | POA: Diagnosis not present

## 2023-06-15 DIAGNOSIS — R112 Nausea with vomiting, unspecified: Secondary | ICD-10-CM | POA: Diagnosis not present

## 2023-06-15 DIAGNOSIS — R0789 Other chest pain: Secondary | ICD-10-CM | POA: Diagnosis not present

## 2023-06-15 DIAGNOSIS — N3289 Other specified disorders of bladder: Secondary | ICD-10-CM | POA: Diagnosis not present

## 2023-06-15 LAB — COMPREHENSIVE METABOLIC PANEL
ALT: 22 U/L (ref 0–44)
AST: 20 U/L (ref 15–41)
Albumin: 4 g/dL (ref 3.5–5.0)
Alkaline Phosphatase: 64 U/L (ref 38–126)
Anion gap: 12 (ref 5–15)
BUN: 25 mg/dL — ABNORMAL HIGH (ref 8–23)
CO2: 23 mmol/L (ref 22–32)
Calcium: 9.6 mg/dL (ref 8.9–10.3)
Chloride: 103 mmol/L (ref 98–111)
Creatinine, Ser: 0.71 mg/dL (ref 0.44–1.00)
GFR, Estimated: >60 mL/min (ref >60)
Glucose, Bld: 119 mg/dL — ABNORMAL HIGH (ref 70–99)
Potassium: 3.5 mmol/L (ref 3.5–5.1)
Sodium: 138 mmol/L (ref 135–145)
Total Bilirubin: 1.2 mg/dL (ref 0.0–1.2)
Total Protein: 6.8 g/dL (ref 6.5–8.1)

## 2023-06-15 LAB — CBC WITH DIFFERENTIAL/PLATELET
Abs Immature Granulocytes: 0.08 10*3/uL — ABNORMAL HIGH (ref 0.00–0.07)
Basophils Absolute: 0.1 10*3/uL (ref 0.0–0.1)
Basophils Relative: 0 %
Eosinophils Absolute: 0 10*3/uL (ref 0.0–0.5)
Eosinophils Relative: 0 %
HCT: 40.8 % (ref 36.0–46.0)
Hemoglobin: 13.4 g/dL (ref 12.0–15.0)
Immature Granulocytes: 1 %
Lymphocytes Relative: 8 %
Lymphs Abs: 1.2 10*3/uL (ref 0.7–4.0)
MCH: 29.8 pg (ref 26.0–34.0)
MCHC: 32.8 g/dL (ref 30.0–36.0)
MCV: 90.7 fL (ref 80.0–100.0)
Monocytes Absolute: 1 10*3/uL (ref 0.1–1.0)
Monocytes Relative: 7 %
Neutro Abs: 12.1 10*3/uL — ABNORMAL HIGH (ref 1.7–7.7)
Neutrophils Relative %: 84 %
Platelets: 317 10*3/uL (ref 150–400)
RBC: 4.5 MIL/uL (ref 3.87–5.11)
RDW: 12.8 % (ref 11.5–15.5)
WBC: 14.4 10*3/uL — ABNORMAL HIGH (ref 4.0–10.5)
nRBC: 0 % (ref 0.0–0.2)

## 2023-06-15 LAB — TROPONIN I (HIGH SENSITIVITY)
Troponin I (High Sensitivity): 6 ng/L (ref <18)
Troponin I (High Sensitivity): 5 ng/L (ref <18)

## 2023-06-15 LAB — LIPASE, BLOOD: Lipase: 36 U/L (ref 11–51)

## 2023-06-15 MED ORDER — IOHEXOL 350 MG/ML SOLN
75.0000 mL | Freq: Once | INTRAVENOUS | Status: AC | PRN
  Administered 2023-06-15: 75 mL via INTRAVENOUS

## 2023-06-15 MED ORDER — HYDROCODONE-ACETAMINOPHEN 5-325 MG PO TABS: 1.0000 | ORAL_TABLET | Freq: Four times a day (QID) | ORAL | 0 refills | Status: DC | PRN

## 2023-06-15 MED ORDER — ONDANSETRON 4 MG PO TBDP: 4.0000 mg | ORAL_TABLET | Freq: Three times a day (TID) | ORAL | 0 refills | Status: DC | PRN

## 2023-06-15 MED ORDER — ONDANSETRON 4 MG PO TBDP
4.0000 mg | ORAL_TABLET | Freq: Once | ORAL | Status: AC
  Administered 2023-06-15: 4 mg via ORAL
  Filled 2023-06-15: qty 1

## 2023-06-15 MED ORDER — HYDROMORPHONE HCL 1 MG/ML IJ SOLN
1.0000 mg | Freq: Once | INTRAMUSCULAR | Status: AC
  Administered 2023-06-15: 1 mg via INTRAVENOUS
  Filled 2023-06-15: qty 1

## 2023-06-15 MED ORDER — ONDANSETRON HCL 4 MG/2ML IJ SOLN
4.0000 mg | Freq: Once | INTRAMUSCULAR | Status: AC
  Administered 2023-06-15: 4 mg via INTRAVENOUS
  Filled 2023-06-15: qty 2

## 2023-06-15 NOTE — ED Notes (Signed)
 Patient is being discharged from the Urgent Care and sent to the Emergency Department via [facility] . Per Dorann Ou, PA, patient is in need of higher level of care due to severe abdominal pain pain and limited resources. Patient is aware and verbalizes understanding of plan of care.  Vitals:   06/15/23 1514  BP: 133/83  Pulse: 79  Resp: 20  Temp: 98.3 F (36.8 C)  SpO2: 100%

## 2023-06-15 NOTE — ED Triage Notes (Signed)
 Severe left side abdominal pain that started 2 hours ago. Patient is currently in triage vomiting.

## 2023-06-15 NOTE — ED Provider Notes (Signed)
 Patient presents today with a 2-hour history of left abdominal pain.  She reports that she was outside working when she felt overheated and started getting severe pain.  This has migrated to involve her left upper chest and she has felt nausea and vomiting.  She denies any history of cardiovascular disease or significant risk factors including hypertension, hyperlipidemia, diabetes, smoking.  She denies history of gastrointestinal disorders or previous abdominal surgery.  She reports that pain is rated 10 on a 0-10 pain scale, described as a pressure, no aggravating leaving factors identified.  She has not tried any over-the-counter medication.  EKG was obtained in triage that showed T wave inversion in lead III without reciprocal changes compared to 04/21/2022 tracing; no other ischemic changes.  Given severity of symptoms recommend she go to the emergency room by [facility].  She was stable at time of discharge and transported by [facility] to [facility], ER for further evaluation and management.   Jeani Hawking, PA-C 06/15/23 1554

## 2023-06-15 NOTE — ED Notes (Signed)
 Patient transported to CT

## 2023-06-15 NOTE — ED Notes (Signed)
 Nt called [facility] to put pt on monitor

## 2023-06-15 NOTE — ED Notes (Signed)
 Patient is particularly painful on left upper abdominal area.  Patient cannot find any comfortable position.  Assisted to flat position at patient request, then assisted to sitting, then patient leaning forward.  Skin is cool and dry.  Last bm was this morning and normal per patient.  Patient reports no urinary issues.

## 2023-06-15 NOTE — ED Triage Notes (Signed)
"  Was working in the yard and began having pain, (points to left upper abdomen/lower chest). It felt like indigestion so I tried some maalox and it did not help" per pt  "Went to urgent care and they called [place] to transport. EKG unremarkable, no complaints of shortness of breath. Did complain of nausea. States pain comes and goes with movement or nothing noticeable to be a factor" per EMS

## 2023-06-15 NOTE — ED Notes (Signed)
 Pt getting dressed for dc at this time. PT refused help from nurse pt said her friend would help her.

## 2023-06-15 NOTE — Discharge Instructions (Signed)
 Take the pain medicine as needed.  Take the antinausea medicine as needed.  Workup here today no evidence of acute cardiac event.  No acute process in the abdomen but as we discussed have not completely ruled out the possibility of peptic ulcer disease.  But do suspect this may be a viral illness.  Return for any new or worse symptoms.  Make an appointment to follow-up with your doctor.

## 2023-06-15 NOTE — ED Notes (Addendum)
 [facility] called for transport.   [facility] states that a truck has been dispatched and is on the way.

## 2023-06-15 NOTE — ED Provider Notes (Addendum)
 [place] EMERGENCY DEPARTMENT AT [facility] Provider Note   CSN: [id number] Arrival date & time: 06/15/23  1553     History  Chief Complaint  Patient presents with   Chest Pain    Lindsey [name] is a 67 y.o. female.  Patient sent in from urgent care for left upper quadrant abdominal pain.  Patient states that it started around 1300 when she was working in the garden.  Initially she just felt kind lightheaded and did not feel well.  Shortly after that the pain started left upper quadrant never had pain like this before.  Is having pain in the back as well she not sure if this is just spasm or if this is related.  Associated with nausea and did vomit once.  Patient's never had any history of kidney stones no history of diverticulitis.  Really no pain in the lower part of the chest but it is left upper quadrant.  Past medical history significant for he is seasonal allergies patient has had tonsillectomy.  Patient is a former smoker.       Home Medications Prior to Admission medications   Medication Sig Start Date End Date Taking? Authorizing Provider  HYDROcodone-acetaminophen (NORCO/VICODIN) 5-325 MG tablet Take 1 tablet by mouth every 6 (six) hours as needed for moderate pain (pain score 4-6). 06/15/23  Yes Vanetta Mulders, MD  ondansetron (ZOFRAN-ODT) 4 MG disintegrating tablet Take 1 tablet (4 mg total) by mouth every 8 (eight) hours as needed for nausea or vomiting. 06/15/23  Yes Vanetta Mulders, MD  Cholecalciferol (VITAMIN D3) 125 MCG (5000 UT) CAPS Take 1 capsule by mouth daily.    [provider]      Allergies    Lactose intolerance (gi), Sulfa antibiotics, and Amoxicillin    Review of Systems   Review of Systems  Constitutional:  Negative for chills and fever.  HENT:  Negative for ear pain and sore throat.   Eyes:  Negative for pain and visual disturbance.  Respiratory:  Negative for cough and shortness of breath.   Cardiovascular:  Positive for  chest pain. Negative for palpitations.  Gastrointestinal:  Positive for abdominal pain, nausea and vomiting. Negative for diarrhea.  Genitourinary:  Negative for dysuria and hematuria.  Musculoskeletal:  Negative for arthralgias and back pain.  Skin:  Negative for color change and rash.  Neurological:  Negative for seizures and syncope.  All other systems reviewed and are negative.   Physical Exam Updated Vital Signs BP 126/80 (BP Location: Right Arm)   Pulse (!) 58   Temp (!) 97.5 F (36.4 C) (Oral)   Resp 16   Ht 1.676 m (5\' 6" )   Wt 115.7 kg   SpO2 100%   BMI 41.16 kg/m  Physical Exam Vitals and nursing note reviewed.  Constitutional:      General: She is not in acute distress.    Appearance: Normal appearance. She is well-developed.  HENT:     Head: Normocephalic and atraumatic.  Eyes:     Extraocular Movements: Extraocular movements intact.     Conjunctiva/sclera: Conjunctivae normal.     Pupils: Pupils are equal, round, and reactive to light.  Cardiovascular:     Rate and Rhythm: Normal rate and regular rhythm.     Heart sounds: No murmur heard. Pulmonary:     Effort: Pulmonary effort is normal. No respiratory distress.     Breath sounds: Normal breath sounds.  Abdominal:     General: There is no distension.  Palpations: Abdomen is soft.     Tenderness: There is abdominal tenderness. There is guarding.     Comments: Tenderness to palpation left upper quadrant.  Musculoskeletal:        General: No swelling.     Cervical back: Neck supple.  Skin:    General: Skin is warm and dry.     Capillary Refill: Capillary refill takes less than 2 seconds.  Neurological:     General: No focal deficit present.     Mental Status: She is alert and oriented to person, place, and time.  Psychiatric:        Mood and Affect: Mood normal.     ED Results / Procedures / Treatments   Labs (all labs ordered are listed, but only abnormal results are displayed) Labs Reviewed   CBC WITH DIFFERENTIAL/PLATELET - Abnormal; Notable for the following components:      Result Value   WBC 14.4 (*)    Neutro Abs 12.1 (*)    Abs Immature Granulocytes 0.08 (*)    All other components within normal limits  COMPREHENSIVE METABOLIC PANEL - Abnormal; Notable for the following components:   Glucose, Bld 119 (*)    BUN 25 (*)    All other components within normal limits  LIPASE, BLOOD  TROPONIN I (HIGH SENSITIVITY)  TROPONIN I (HIGH SENSITIVITY)    EKG EKG Interpretation Date/Time:  Wednesday June 15 2023 16:45:59 EDT Ventricular Rate:  57 PR Interval:  202 QRS Duration:  124 QT Interval:  393 QTC Calculation: 383 R Axis:   68  Text Interpretation: Sinus arrhythmia IVCD, consider atypical RBBB Anteroseptal infarct, age indeterminate ST elevation, consider inferior injury Lateral leads are also involved No previous ECGs available Confirmed by Vanetta Mulders [phone]) on 06/15/2023 5:46:06 PM  Radiology CT ABDOMEN PELVIS W CONTRAST Result Date: 06/15/2023 CLINICAL DATA:  Left upper quadrant pain for 2 hours, initial encounter EXAM: CT ABDOMEN AND PELVIS WITH CONTRAST TECHNIQUE: Multidetector CT imaging of the abdomen and pelvis was performed using the standard protocol following bolus administration of intravenous contrast. RADIATION DOSE REDUCTION: This exam was performed according to the departmental dose-optimization program which includes automated exposure control, adjustment of the mA and/or kV according to patient size and/or use of iterative reconstruction technique. CONTRAST:  75mL OMNIPAQUE IOHEXOL 350 MG/ML SOLN COMPARISON:  None Available. FINDINGS: Lower chest: No acute abnormality. Hepatobiliary: No focal liver abnormality is seen. No gallstones, gallbladder wall thickening, or biliary dilatation. Pancreas: Unremarkable. No pancreatic ductal dilatation or surrounding inflammatory changes. Spleen: Normal in size without focal abnormality. Adrenals/Urinary Tract:  Adrenal glands are within normal limits. Kidneys are well visualized bilaterally. No renal calculi are identified. No obstructive changes are seen. The bladder is well distended. Stomach/Bowel: Scattered fecal material is noted throughout the colon. Diverticular changes noted particularly in the sigmoid colon without evidence of diverticulitis. No obstructive changes are seen. The appendix is within normal limits. Small bowel and stomach are within normal limits. Vascular/Lymphatic: Aortic atherosclerosis. No enlarged abdominal or pelvic lymph nodes. Reproductive: Uterus and bilateral adnexa are unremarkable. Other: No abdominal wall hernia or abnormality. No abdominopelvic ascites. Musculoskeletal: No acute or significant osseous findings. IMPRESSION: Diverticulosis without diverticulitis. No acute abnormality noted. Electronically Signed   By: Alcide Clever M.D.   On: 06/15/2023 19:53   DG Chest Port 1 View Result Date: 06/15/2023 CLINICAL DATA:  Chest pain. EXAM: PORTABLE CHEST 1 VIEW COMPARISON:  None Available. FINDINGS: The heart size and mediastinal contours are within normal limits. Low  lung volumes are noted. There is no evidence of acute infiltrate, pleural effusion or pneumothorax. The visualized skeletal structures are unremarkable. IMPRESSION: Low lung volumes without evidence of acute or active cardiopulmonary disease. Electronically Signed   By: Aram Candela M.D.   On: 06/15/2023 18:03    Procedures Procedures    Medications Ordered in ED Medications  ondansetron (ZOFRAN-ODT) disintegrating tablet 4 mg (has no administration in time range)  ondansetron (ZOFRAN) injection 4 mg (4 mg Intravenous Given 06/15/23 1655)  HYDROmorphone (DILAUDID) injection 1 mg (1 mg Intravenous Given 06/15/23 1655)  iohexol (OMNIPAQUE) 350 MG/ML injection 75 mL (75 mLs Intravenous Contrast Given 06/15/23 1838)  HYDROmorphone (DILAUDID) injection 1 mg (1 mg Intravenous Given 06/15/23 2034)    ED Course/  Medical Decision Making/ A&P                                 Medical Decision Making Amount and/or Complexity of Data Reviewed Labs: ordered. Radiology: ordered.  Risk Prescription drug management.  Patient feeling better after the antinausea medicine the pain medicine.  Lab workup here CBC white count of 14.4 hemoglobin 13.4 platelets 317 complete metabolic panel is normal including liver function test.  And lipase is normal at 36.  No anion gap.  Chest x-ray showed no acute findings.  Patient's initial troponin was 6 and delta troponin is pending.  CT scan abdomen and pelvis which I thought would maybe shows diverticulitis because of the left upper quadrant abdominal pain is actually shows just diverticulosis without any evidence of diverticulitis and no other acute findings.  Discussed with patient is possible this could be stomach ulcer with the way this came on suddenly more suggestive may be of a viral process.  Still awaiting delta troponin.  Delta troponin is 5 actually lower than the original at 6.  Still suspect viral process.  Peptic ulcer disease not completely ruled out.  Will have patient follow-up with her doctor.  Will treat for the pain and antinausea medicine.  No evidence of any acute cardiac event no evidence of any acute intra-abdominal event or process.  After discharge patient started with the nausea and vomiting again.  But IV it was removed.  This does make this sound like this has been a viral process that hit her.  We will give her some ODT Zofran.  And she does have prescriptions a 24-hour pharmacy for antinausea medicine which is Zofran ODT and some hydrocodone.   Final Clinical Impression(s) / ED Diagnoses Final diagnoses:  Left upper quadrant abdominal pain  Nausea and vomiting, unspecified vomiting type    Rx / DC Orders ED Discharge Orders          Ordered    ondansetron (ZOFRAN-ODT) 4 MG disintegrating tablet  Every 8 hours PRN        06/15/23  2220    HYDROcodone-acetaminophen (NORCO/VICODIN) 5-325 MG tablet  Every 6 hours PRN        06/15/23 2220              Vanetta Mulders, MD 06/15/23 1625    Vanetta Mulders, MD 06/15/23 1610    Vanetta Mulders, MD 06/15/23 [id number]    Vanetta Mulders, MD 06/15/23 2217    Vanetta Mulders, MD 06/15/23 2246

## 2023-06-27 DIAGNOSIS — R1032 Left lower quadrant pain: Secondary | ICD-10-CM | POA: Diagnosis not present

## 2023-06-30 ENCOUNTER — Ambulatory Visit (INDEPENDENT_AMBULATORY_CARE_PROVIDER_SITE_OTHER): Admitting: Family Medicine

## 2023-06-30 ENCOUNTER — Encounter (INDEPENDENT_AMBULATORY_CARE_PROVIDER_SITE_OTHER): Payer: Self-pay | Admitting: Family Medicine

## 2023-06-30 VITALS — BP 113/71 | HR 75 | Temp 98.2°F | Ht 66.0 in | Wt 245.0 lb

## 2023-06-30 DIAGNOSIS — E669 Obesity, unspecified: Secondary | ICD-10-CM | POA: Diagnosis not present

## 2023-06-30 DIAGNOSIS — R109 Unspecified abdominal pain: Secondary | ICD-10-CM | POA: Diagnosis not present

## 2023-06-30 DIAGNOSIS — Z6839 Body mass index (BMI) 39.0-39.9, adult: Secondary | ICD-10-CM | POA: Diagnosis not present

## 2023-06-30 DIAGNOSIS — K5790 Diverticulosis of intestine, part unspecified, without perforation or abscess without bleeding: Secondary | ICD-10-CM | POA: Diagnosis not present

## 2023-06-30 DIAGNOSIS — R632 Polyphagia: Secondary | ICD-10-CM

## 2023-06-30 NOTE — Progress Notes (Signed)
 Office: [phone]  /  Fax: [phone]  WEIGHT SUMMARY AND BIOMETRICS  Anthropometric Measurements Height: 5\' 6"  (1.676 m) Weight: 245 lb (111.1 kg) BMI (Calculated): 39.56 Weight at Last Visit: 257 lb Weight Lost Since Last Visit: 12 lb Weight Gained Since Last Visit: 0 Starting Weight: 325 lb Total Weight Loss (lbs): 78 lb (35.4 kg)   Body Composition  Body Fat %: 52.6 % Fat Mass (lbs): 129.2 lbs Muscle Mass (lbs): 116.4 lbs Visceral Fat Rating : 17   Other Clinical Data Fasting: no Labs: no Today's Visit #: 55 Starting Date: 06/04/17    Chief Complaint: OBESITY   History of Present Illness Lindsey [name] is a 68 year old female who presents for assessment of her obesity treatment plan and progress.  She is adhering to a prescribed eating plan of 1500 to 1800 calories with an additional 100 grams of protein, maintaining compliance 95% of the time. She engages in walking for exercise, 30 minutes three times per week, resulting in a 12-pound weight loss over the past month. She reports feeling less bloated and notes a reduction in inflammation and general aches and pains.  Two weeks ago, she experienced severe left-sided abdominal pain, described as central and deep, prompting an emergency room visit via ambulance. Multiple tests, including EKGs, ruled out a myocardial infarction. CT scans showed a normal pancreas and no diverticulitis, although her white blood cell count was elevated. She was informed it might have been a gastrointestinal bug causing colicky pain, which briefly subsided before returning. She was not constipated, and no stones were found. She was prescribed hydrocodone, which she took once, and felt fine the next day. She also experienced vomiting and was unable to keep water down during the episode.  She experiences episodes of intense hunger shortly after eating, despite feeling full, which she identifies as 'real hunger' rather than cravings. She  does not use protein supplements, preferring to obtain protein through food. She has symptoms suggestive of fluctuating blood sugars, such as feeling overheated without sweating and indigestion, which did not respond to bicarbonate or Gas-X.  She has a known history of diverticulosis, although she has not had a colonoscopy in over 20 years. She performs annual hemoccult screenings. Her family history includes her father having a perforation from diverticulitis requiring resection, and her mother having an improperly cauterized polyp leading to an emergency resection. Her mother was noted to have an unusually long colon.      PHYSICAL EXAM:  Blood pressure 113/71, pulse 75, temperature 98.2 F (36.8 C), height 5\' 6"  (1.676 m), weight 245 lb (111.1 kg), SpO2 97%. Body mass index is 39.54 kg/m.  DIAGNOSTIC DATA REVIEWED:  BMET    Component Value Date/Time   NA 138 06/15/2023 1656   NA 144 02/22/2022 1212   K 3.5 06/15/2023 1656   CL 103 06/15/2023 1656   CO2 23 06/15/2023 1656   GLUCOSE 119 (H) 06/15/2023 1656   BUN 25 (H) 06/15/2023 1656   BUN 19 02/22/2022 1212   CREATININE 0.71 06/15/2023 1656   CREATININE 0.63 05/03/2022 1018   CALCIUM 9.6 06/15/2023 1656   GFRNONAA >60 06/15/2023 1656   GFRNONAA >60 05/03/2022 1018   GFRAA 114 03/26/2019 1140   Lab Results  Component Value Date   HGBA1C 5.2 08/20/2020   HGBA1C 5.7 (H) 05/25/2017   Lab Results  Component Value Date   INSULIN 7.3 08/20/2020   INSULIN 44.2 (H) 05/25/2017   Lab Results  Component Value Date  TSH 2.88 09/19/2020   CBC    Component Value Date/Time   WBC 14.4 (H) 06/15/2023 1656   RBC 4.50 06/15/2023 1656   HGB 13.4 06/15/2023 1656   HGB 12.5 05/03/2022 1018   HGB 13.6 05/25/2017 1147   HCT 40.8 06/15/2023 1656   HCT 40.7 05/25/2017 1147   PLT 317 06/15/2023 1656   PLT 277 05/03/2022 1018   MCV 90.7 06/15/2023 1656   MCV 91 05/25/2017 1147   MCH 29.8 06/15/2023 1656   MCHC 32.8 06/15/2023  1656   RDW 12.8 06/15/2023 1656   RDW 13.9 05/25/2017 1147   Iron Studies    Component Value Date/Time   FERRITIN 102.0 09/19/2020 0000   Lipid Panel     Component Value Date/Time   CHOL 178 09/24/2020 0000   CHOL 159 03/26/2019 1140   TRIG 50 09/19/2020 0000   HDL 76 (A) 09/24/2020 0000   HDL 66 03/26/2019 1140   LDLCALC 92 09/24/2020 0000   LDLCALC 81 03/26/2019 1140   Hepatic Function Panel     Component Value Date/Time   PROT 6.8 06/15/2023 1656   PROT 6.9 08/20/2020 1325   ALBUMIN 4.0 06/15/2023 1656   ALBUMIN 4.5 08/20/2020 1325   AST 20 06/15/2023 1656   AST 18 05/03/2022 1018   ALT 22 06/15/2023 1656   ALT 15 05/03/2022 1018   ALKPHOS 64 06/15/2023 1656   BILITOT 1.2 06/15/2023 1656   BILITOT 1.1 05/03/2022 1018      Component Value Date/Time   TSH 2.88 09/19/2020 0000   TSH 2.070 05/25/2017 1147   Nutritional Lab Results  Component Value Date   VD25OH 69.5 08/20/2020   VD25OH 64.7 06/26/2019   VD25OH 56.6 03/26/2019     Assessment and Plan Assessment & Plan Abdominal Pain Severe left-sided abdominal pain occurred two weeks ago, prompting an emergency room visit. Cardiac issues, pancreatitis, and diverticulitis were ruled out through EKGs and CT scans. Elevated white blood cells suggested a possible infection, but the colicky nature of the pain indicated a gastrointestinal issue. A short-term GI bug is suspected, consistent with recent similar cases. - Monitor for recurrence of abdominal pain - Consider further evaluation if symptoms return  Obesity She is adhering to a dietary plan of 1500-1800 calories with increased protein intake and exercises by walking 30 minutes three times a week. She has lost 12 pounds in the last month, with 8 pounds being fat loss. She reports reduced bloating and inflammation-related symptoms. Discussed the importance of continuing the regimen and addressed hunger concerns, explaining blood sugar hunger management.  Metformin was discussed as a potential option to stabilize blood sugar and insulin levels, with benefits including diabetes prevention and arterial inflammation reduction. The main side effect is queasiness if taken on an empty stomach, occurring in 20% of cases. She was advised to take it after meals. - Continue dietary plan of 1500-1800 calories with increased protein intake - Continue exercise regimen of walking 30 minutes three times a week - Monitor hunger patterns and consider metformin if blood sugar fluctuations persist - Provide handout on metformin for blood sugar stabilization  Diverticulosis Diverticulosis was identified on a previous scan, but she has not had a colonoscopy in over 20 years. Explained that diverticulosis usually does not cause pain unless complicated by infection or obstruction. - questions about diverticulosis answered and the disease process explained. With her new abdominal pain she may need to be evaluated by GI to see if cologuard is enough or colonoscopy  is indicated  General Health Maintenance She performs annual hemoccult screenings but has not had a colonoscopy in over 20 years. Discussed the importance of regular screenings, especially given the family history of colon issues. - Encourage continuation of annual hemoccult screenings - Discuss scheduling a colonoscopy for comprehensive evaluation   She was informed of the importance of frequent follow up visits to maximize her success with intensive lifestyle modifications for her multiple health conditions.    Quillian Quince, MD

## 2023-07-07 DIAGNOSIS — Z1211 Encounter for screening for malignant neoplasm of colon: Secondary | ICD-10-CM | POA: Diagnosis not present

## 2023-07-07 DIAGNOSIS — K5730 Diverticulosis of large intestine without perforation or abscess without bleeding: Secondary | ICD-10-CM | POA: Diagnosis not present

## 2023-08-09 ENCOUNTER — Ambulatory Visit (INDEPENDENT_AMBULATORY_CARE_PROVIDER_SITE_OTHER): Admitting: Family Medicine

## 2023-08-09 ENCOUNTER — Encounter (INDEPENDENT_AMBULATORY_CARE_PROVIDER_SITE_OTHER): Payer: Self-pay | Admitting: Family Medicine

## 2023-08-09 VITALS — BP 136/82 | HR 67 | Temp 98.3°F | Ht 66.0 in | Wt 245.0 lb

## 2023-08-09 DIAGNOSIS — Z6839 Body mass index (BMI) 39.0-39.9, adult: Secondary | ICD-10-CM | POA: Diagnosis not present

## 2023-08-09 DIAGNOSIS — E669 Obesity, unspecified: Secondary | ICD-10-CM | POA: Diagnosis not present

## 2023-08-09 DIAGNOSIS — Z6841 Body Mass Index (BMI) 40.0 and over, adult: Secondary | ICD-10-CM

## 2023-08-09 DIAGNOSIS — I10 Essential (primary) hypertension: Secondary | ICD-10-CM | POA: Diagnosis not present

## 2023-08-09 DIAGNOSIS — E88819 Insulin resistance, unspecified: Secondary | ICD-10-CM

## 2023-08-09 NOTE — Progress Notes (Signed)
 Office: [phone]  /  Fax: [phone]  WEIGHT SUMMARY AND BIOMETRICS  Anthropometric Measurements Height: 5\' 6"  (1.676 m) Weight: 245 lb (111.1 kg) BMI (Calculated): 39.56 Weight at Last Visit: 245 lb Weight Lost Since Last Visit: 2 lb Weight Gained Since Last Visit: 0 Starting Weight: 325 lb Total Weight Loss (lbs): 82 lb (37.2 kg) Peak Weight: 325 lb   Body Composition  Body Fat %: 53.1 % Fat Mass (lbs): 129.2 lbs Muscle Mass (lbs): 108.4 lbs Visceral Fat Rating : 17   Other Clinical Data Fasting: no Labs: no Today's Visit #: 56 Starting Date: 05/25/17    Chief Complaint: OBESITY    History of Present Illness Lindsey [name] is a 68 year old female with obesity and hypertension who presents for obesity treatment plan assessment and progress evaluation.  She is actively engaged in a weight management plan, consuming 1600 calories daily with at least 100 grams of protein, achieving this 80% of the time. She incorporates physical activity by walking for 30 minutes four times a week. Over the past month, she has lost two pounds since her last visit.  Her hypertension is well controlled through lifestyle modifications, including diet, exercise, and weight loss. She is not currently on any antihypertensive medications.  She experiences challenges with hunger, particularly on certain days, leading to increased caloric intake. She consumes over 3000 calories on some days due to situational factors like social events and stress. Despite these challenges, she manages to have days with lower calorie intake.  She experiences nocturnal awakenings due to a sensitive bladder, waking up twice a night to urinate. During these awakenings, she sometimes eats, often consuming peanut butter on thin bread or crackers.  She is apprehensive about an upcoming colonoscopy due to the fasting requirement and a family history of complications during similar procedures. She has been using a  stool test for colon cancer screening, which is not equivalent to a Cologuard test.      PHYSICAL EXAM:  Blood pressure 136/82, pulse 67, temperature 98.3 F (36.8 C), height 5\' 6"  (1.676 m), weight 245 lb (111.1 kg), SpO2 96%. Body mass index is 39.54 kg/m.  DIAGNOSTIC DATA REVIEWED:  BMET    Component Value Date/Time   NA 138 06/15/2023 1656   NA 144 02/22/2022 1212   K 3.5 06/15/2023 1656   CL 103 06/15/2023 1656   CO2 23 06/15/2023 1656   GLUCOSE 119 (H) 06/15/2023 1656   BUN 25 (H) 06/15/2023 1656   BUN 19 02/22/2022 1212   CREATININE 0.71 06/15/2023 1656   CREATININE 0.63 05/03/2022 1018   CALCIUM 9.6 06/15/2023 1656   GFRNONAA >60 06/15/2023 1656   GFRNONAA >60 05/03/2022 1018   GFRAA 114 03/26/2019 1140   Lab Results  Component Value Date   HGBA1C 5.2 08/20/2020   HGBA1C 5.7 (H) 05/25/2017   Lab Results  Component Value Date   INSULIN  7.3 08/20/2020   INSULIN  44.2 (H) 05/25/2017   Lab Results  Component Value Date   TSH 2.88 09/19/2020   CBC    Component Value Date/Time   WBC 14.4 (H) 06/15/2023 1656   RBC 4.50 06/15/2023 1656   HGB 13.4 06/15/2023 1656   HGB 12.5 05/03/2022 1018   HGB 13.6 05/25/2017 1147   HCT 40.8 06/15/2023 1656   HCT 40.7 05/25/2017 1147   PLT 317 06/15/2023 1656   PLT 277 05/03/2022 1018   MCV 90.7 06/15/2023 1656   MCV 91 05/25/2017 1147   MCH 29.8  06/15/2023 1656   MCHC 32.8 06/15/2023 1656   RDW 12.8 06/15/2023 1656   RDW 13.9 05/25/2017 1147   Iron Studies    Component Value Date/Time   FERRITIN 102.0 09/19/2020 0000   Lipid Panel     Component Value Date/Time   CHOL 178 09/24/2020 0000   CHOL 159 03/26/2019 1140   TRIG 50 09/19/2020 0000   HDL 76 (A) 09/24/2020 0000   HDL 66 03/26/2019 1140   LDLCALC 92 09/24/2020 0000   LDLCALC 81 03/26/2019 1140   Hepatic Function Panel     Component Value Date/Time   PROT 6.8 06/15/2023 1656   PROT 6.9 08/20/2020 1325   ALBUMIN 4.0 06/15/2023 1656   ALBUMIN  4.5 08/20/2020 1325   AST 20 06/15/2023 1656   AST 18 05/03/2022 1018   ALT 22 06/15/2023 1656   ALT 15 05/03/2022 1018   ALKPHOS 64 06/15/2023 1656   BILITOT 1.2 06/15/2023 1656   BILITOT 1.1 05/03/2022 1018      Component Value Date/Time   TSH 2.88 09/19/2020 0000   TSH 2.070 05/25/2017 1147   Nutritional Lab Results  Component Value Date   VD25OH 69.5 08/20/2020   VD25OH 64.7 06/26/2019   VD25OH 56.6 03/26/2019     Assessment and Plan Assessment & Plan Obesity Following a 1600 calorie and 100+ gram protein journaling plan, meeting goals 80% of the time. Engaging in physical activity by walking 30 minutes four times a week. Weight loss of 2 pounds in the last month, which is slower than desired. Experiencing situational challenges and occasional excessive hunger, potentially related to blood sugar levels. Discussed potential use of metformin post-colonoscopy to address insulin  resistance and hunger issues. Informed consent for metformin includes potential side effects such as nausea, vomiting, and diarrhea, especially if taken on an empty stomach. Benefits include addressing blood sugar-related hunger. - Continue 1600 calorie and 100+ gram protein journaling plan - Continue walking 30 minutes four times a week - Discuss metformin post-colonoscopy  Insulin  resistance Experiences hunger even when full, indicating possible blood sugar-related hunger. Discussed potential use of metformin to address insulin  resistance and associated hunger issues. Metformin side effects include nausea, vomiting, and diarrhea, especially if taken on an empty stomach. Plan to revisit metformin discussion post-colonoscopy. - Revisit metformin discussion post-colonoscopy  Hypertension Hypertension is well-controlled with lifestyle modifications including diet, exercise, and weight loss. Blood pressure today is 136/82 mmHg. No current medication use for hypertension as lifestyle changes are  effective.  Follow-up Scheduled for a follow-up appointment in June. Option to schedule July appointment was discussed. - Follow up in June - Schedule July appointment if desired  I have personally spent 32 minutes total time today in preparation, patient care, and documentation for this visit, including the following: review of clinical lab tests; review of medical tests/procedures/services and discussing nutrition.     She was informed of the importance of frequent follow up visits to maximize her success with intensive lifestyle modifications for her multiple health conditions.    Jasmine Mesi, MD

## 2023-08-17 DIAGNOSIS — K5730 Diverticulosis of large intestine without perforation or abscess without bleeding: Secondary | ICD-10-CM | POA: Diagnosis not present

## 2023-08-17 DIAGNOSIS — Z1211 Encounter for screening for malignant neoplasm of colon: Secondary | ICD-10-CM | POA: Diagnosis not present

## 2023-08-17 DIAGNOSIS — D125 Benign neoplasm of sigmoid colon: Secondary | ICD-10-CM | POA: Diagnosis not present

## 2023-08-17 DIAGNOSIS — K635 Polyp of colon: Secondary | ICD-10-CM | POA: Diagnosis not present

## 2023-09-06 ENCOUNTER — Ambulatory Visit (INDEPENDENT_AMBULATORY_CARE_PROVIDER_SITE_OTHER): Admitting: Family Medicine

## 2023-09-14 DIAGNOSIS — M19012 Primary osteoarthritis, left shoulder: Secondary | ICD-10-CM | POA: Diagnosis not present

## 2023-09-27 DIAGNOSIS — M1712 Unilateral primary osteoarthritis, left knee: Secondary | ICD-10-CM | POA: Diagnosis not present

## 2023-10-06 ENCOUNTER — Encounter (INDEPENDENT_AMBULATORY_CARE_PROVIDER_SITE_OTHER): Payer: Self-pay | Admitting: Family Medicine

## 2023-10-06 ENCOUNTER — Ambulatory Visit (INDEPENDENT_AMBULATORY_CARE_PROVIDER_SITE_OTHER): Admitting: Family Medicine

## 2023-10-06 VITALS — BP 127/61 | HR 72 | Temp 98.8°F | Ht 66.0 in | Wt 242.0 lb

## 2023-10-06 DIAGNOSIS — E669 Obesity, unspecified: Secondary | ICD-10-CM | POA: Diagnosis not present

## 2023-10-06 DIAGNOSIS — R7303 Prediabetes: Secondary | ICD-10-CM

## 2023-10-06 DIAGNOSIS — Z6839 Body mass index (BMI) 39.0-39.9, adult: Secondary | ICD-10-CM

## 2023-10-06 DIAGNOSIS — E66813 Obesity, class 3: Secondary | ICD-10-CM

## 2023-10-06 MED ORDER — METFORMIN HCL 500 MG PO TABS: 500.0000 mg | ORAL_TABLET | Freq: Every day | ORAL | 0 refills | Status: DC

## 2023-10-06 NOTE — Progress Notes (Signed)
 Office: [phone]  /  Fax: [phone]  WEIGHT SUMMARY AND BIOMETRICS  Anthropometric Measurements Height: 5' 6 (1.676 m) Weight: 242 lb (109.8 kg) BMI (Calculated): 39.08 Weight at Last Visit: 245 lb Weight Lost Since Last Visit: 3 lb Weight Gained Since Last Visit: 0 Starting Weight: 325 lb Total Weight Loss (lbs): 83 lb (37.6 kg) Peak Weight: 325 lb   Body Composition  Body Fat %: 52.7 % Fat Mass (lbs): 128 lbs Muscle Mass (lbs): 109 lbs Visceral Fat Rating : 17   Other Clinical Data Fasting: no Labs: no Today's Visit #: 62 Starting Date: 05/25/17    Chief Complaint: OBESITY    History of Present Illness Lindsey [name] is a 68 year old female who presents for obesity treatment and progress assessment.  She has been following a dietary plan of 1600 calories and 100 or more grams of protein, achieving this about 75% of the time. Her physical activity includes walking for 30 minutes twice a week. Since her last visit two months ago, she has lost three pounds.  She recently underwent a colonoscopy and experienced severe diarrhea due to the preparation medication Linzess. Despite this, the procedure was completed, and a precancerous polyp was found.  She experiences symptoms of low blood sugar, particularly at night, causing an 'antsy feeling' that prevents her from sleeping unless she eats something. To manage this, she typically eats dinner after 9 PM to avoid hunger at bedtime. Recently, she has struggled with consuming sweets at work, which has been difficult to control.  She experiences muscle cramps, especially after consuming a lot of carbohydrates or engaging in more physical activity than usual. These cramps are particularly severe in the thighs and groin.      PHYSICAL EXAM:  Blood pressure 127/61, pulse 72, temperature 98.8 F (37.1 C), height 5' 6 (1.676 m), weight 242 lb (109.8 kg), SpO2 95%. Body mass index is 39.06 kg/m.  DIAGNOSTIC DATA  REVIEWED:  BMET    Component Value Date/Time   NA 138 06/15/2023 1656   NA 144 02/22/2022 1212   K 3.5 06/15/2023 1656   CL 103 06/15/2023 1656   CO2 23 06/15/2023 1656   GLUCOSE 119 (H) 06/15/2023 1656   BUN 25 (H) 06/15/2023 1656   BUN 19 02/22/2022 1212   CREATININE 0.71 06/15/2023 1656   CREATININE 0.63 05/03/2022 1018   CALCIUM 9.6 06/15/2023 1656   GFRNONAA >60 06/15/2023 1656   GFRNONAA >60 05/03/2022 1018   GFRAA 114 03/26/2019 1140   Lab Results  Component Value Date   HGBA1C 5.2 08/20/2020   HGBA1C 5.7 (H) 05/25/2017   Lab Results  Component Value Date   INSULIN  7.3 08/20/2020   INSULIN  44.2 (H) 05/25/2017   Lab Results  Component Value Date   TSH 2.88 09/19/2020   CBC    Component Value Date/Time   WBC 14.4 (H) 06/15/2023 1656   RBC 4.50 06/15/2023 1656   HGB 13.4 06/15/2023 1656   HGB 12.5 05/03/2022 1018   HGB 13.6 05/25/2017 1147   HCT 40.8 06/15/2023 1656   HCT 40.7 05/25/2017 1147   PLT 317 06/15/2023 1656   PLT 277 05/03/2022 1018   MCV 90.7 06/15/2023 1656   MCV 91 05/25/2017 1147   MCH 29.8 06/15/2023 1656   MCHC 32.8 06/15/2023 1656   RDW 12.8 06/15/2023 1656   RDW 13.9 05/25/2017 1147   Iron Studies    Component Value Date/Time   FERRITIN 102.0 09/19/2020 0000   Lipid  Panel     Component Value Date/Time   CHOL 178 09/24/2020 0000   CHOL 159 03/26/2019 1140   TRIG 50 09/19/2020 0000   HDL 76 (A) 09/24/2020 0000   HDL 66 03/26/2019 1140   LDLCALC 92 09/24/2020 0000   LDLCALC 81 03/26/2019 1140   Hepatic Function Panel     Component Value Date/Time   PROT 6.8 06/15/2023 1656   PROT 6.9 08/20/2020 1325   ALBUMIN 4.0 06/15/2023 1656   ALBUMIN 4.5 08/20/2020 1325   AST 20 06/15/2023 1656   AST 18 05/03/2022 1018   ALT 22 06/15/2023 1656   ALT 15 05/03/2022 1018   ALKPHOS 64 06/15/2023 1656   BILITOT 1.2 06/15/2023 1656   BILITOT 1.1 05/03/2022 1018      Component Value Date/Time   TSH 2.88 09/19/2020 0000   TSH  2.070 05/25/2017 1147   Nutritional Lab Results  Component Value Date   VD25OH 69.5 08/20/2020   VD25OH 64.7 06/26/2019   VD25OH 56.6 03/26/2019     Assessment and Plan Assessment & Plan Obesity She is adhering to a dietary plan of 1600 calories and 100 grams of protein, achieving this 75% of the time. She exercises by walking 30 minutes twice weekly and has lost 3 pounds in the last two months. She is considering metformin  for weight loss and prediabetes management. Metformin  is cost-effective, non-addictive, and generally well-tolerated, though it may cause gastrointestinal issues, particularly diarrhea, with dietary indiscretions. Muscle cramping is uncommon. Initiating at a low dose is advised to assess tolerance. - Initiate metformin  at 250 mg daily for two weeks. - Evaluate tolerance and effectiveness after two weeks. - Consider increasing to 500 mg daily if no improvement and no adverse effects occur. - Provide educational material on metformin . - Discuss a high-protein, low-calorie dessert recipe to manage hunger and cravings.  Prediabetes She experiences nocturnal hypoglycemia affecting sleep. Metformin  may stabilize blood glucose and assist in weight loss. She is concerned about metformin  timing and its impact on blood glucose. It should be taken postprandially to minimize nausea and gastrointestinal issues. - Initiate metformin  as outlined under Obesity. - Allow her to choose postprandial administration after breakfast or lunch based on symptoms and routine.  Colonoscopy follow-up A recent colonoscopy revealed a precancerous polyp. She experienced significant gastrointestinal distress with Linzess during preparation. A follow-up colonoscopy is required in five years. - Schedule a follow-up colonoscopy in five years.  General Health Maintenance She is actively managing weight, engaging in regular physical activity, and monitoring dietary intake. - Maintain current exercise  regimen of walking 30 minutes twice weekly. - Continue dietary journaling focusing on 1600 calories and 100 grams of protein daily.  Follow-up Follow-up in four weeks to assess metformin  effectiveness and tolerance, and to continue monitoring weight and dietary habits. - Schedule follow-up appointment in four weeks. - Send metformin  prescription to her preferred pharmacy.    She was informed of the importance of frequent follow up visits to maximize her success with intensive lifestyle modifications for her multiple health conditions.    Louann Penton, MD

## 2023-10-31 DIAGNOSIS — R928 Other abnormal and inconclusive findings on diagnostic imaging of breast: Secondary | ICD-10-CM | POA: Diagnosis not present

## 2023-11-03 ENCOUNTER — Encounter (INDEPENDENT_AMBULATORY_CARE_PROVIDER_SITE_OTHER): Payer: Self-pay | Admitting: Family Medicine

## 2023-11-03 ENCOUNTER — Encounter: Payer: Self-pay | Admitting: Hematology and Oncology

## 2023-11-03 ENCOUNTER — Ambulatory Visit (INDEPENDENT_AMBULATORY_CARE_PROVIDER_SITE_OTHER): Admitting: Family Medicine

## 2023-11-03 VITALS — BP 136/84 | HR 75 | Temp 98.2°F | Ht 66.0 in | Wt 245.0 lb

## 2023-11-03 DIAGNOSIS — E669 Obesity, unspecified: Secondary | ICD-10-CM | POA: Diagnosis not present

## 2023-11-03 DIAGNOSIS — M1711 Unilateral primary osteoarthritis, right knee: Secondary | ICD-10-CM | POA: Diagnosis not present

## 2023-11-03 DIAGNOSIS — R519 Headache, unspecified: Secondary | ICD-10-CM | POA: Diagnosis not present

## 2023-11-03 DIAGNOSIS — R7303 Prediabetes: Secondary | ICD-10-CM | POA: Diagnosis not present

## 2023-11-03 DIAGNOSIS — Z6841 Body Mass Index (BMI) 40.0 and over, adult: Secondary | ICD-10-CM

## 2023-11-03 DIAGNOSIS — Z6839 Body mass index (BMI) 39.0-39.9, adult: Secondary | ICD-10-CM

## 2023-11-03 DIAGNOSIS — R1013 Epigastric pain: Secondary | ICD-10-CM

## 2023-11-03 DIAGNOSIS — R600 Localized edema: Secondary | ICD-10-CM | POA: Diagnosis not present

## 2023-11-03 MED ORDER — METFORMIN HCL ER 500 MG PO TB24: 500.0000 mg | ORAL_TABLET | Freq: Every day | ORAL | 0 refills | Status: DC

## 2023-11-03 NOTE — Progress Notes (Signed)
 Office: [phone]  /  Fax: [phone]  WEIGHT SUMMARY AND BIOMETRICS  Anthropometric Measurements Height: 5' 6 (1.676 m) Weight: 245 lb (111.1 kg) BMI (Calculated): 39.56 Weight at Last Visit: 242 lb Weight Lost Since Last Visit: 0 Weight Gained Since Last Visit: 3 lb Starting Weight: 325 lb Total Weight Loss (lbs): 80 lb (36.3 kg) Peak Weight: 325 lb   Body Composition  Body Fat %: 53.7 % Fat Mass (lbs): 13.8 lbs Muscle Mass (lbs): 107.8 lbs Visceral Fat Rating : 18   Other Clinical Data Fasting: no Labs: no Today's Visit #: 47 Starting Date: 05/25/17    Chief Complaint: OBESITY   Discussed the use of AI scribe software for clinical note transcription with the patient, who gave verbal consent to proceed.  History of Present Illness Lindsey [name] is a 68 year old female with obesity and prediabetes who presents for obesity treatment plan assessment and progress evaluation.  She has been following a diet of 1600 calories and over 100 grams of protein, with adherence about 50% of the time. She has not been exercising and has gained three pounds in the last month. She feels 'very lazy' and notes swelling in her feet, significant enough that she 'can't see the bones in the feet,' which she noticed yesterday.  She has been on metformin for a month and initially experienced indigestion, described as a feeling of her throat being 'clogged up,' which she managed with bicarbonate. She also experienced low-grade headaches and a lack of energy, affecting her meal planning and cooking. No diarrhea. She reports hunger at night and has adjusted her metformin intake to the morning, which helps her through the day but not the evening. She typically eats dinner late, around 8 to 10 PM, and stays up late.  She discusses her water intake, noting she drinks water when thirsty but acknowledges she may be dehydrated as she often wakes up thirsty. She drinks water in small amounts  before bed and throughout the day but does not consciously drink water regularly.  She reports right knee pain, which has worsened despite receiving a knee injection on July 8th. The injection typically lasts three to six months, but she noticed problems within the first 30 days. She is unable to receive another injection yet.      PHYSICAL EXAM:  Blood pressure 136/84, pulse 75, temperature 98.2 F (36.8 C), height 5' 6 (1.676 m), weight 245 lb (111.1 kg), SpO2 96%. Body mass index is 39.54 kg/m.  DIAGNOSTIC DATA REVIEWED:  BMET    Component Value Date/Time   NA 138 06/15/2023 1656   NA 144 02/22/2022 1212   K 3.5 06/15/2023 1656   CL 103 06/15/2023 1656   CO2 23 06/15/2023 1656   GLUCOSE 119 (H) 06/15/2023 1656   BUN 25 (H) 06/15/2023 1656   BUN 19 02/22/2022 1212   CREATININE 0.71 06/15/2023 1656   CREATININE 0.63 05/03/2022 1018   CALCIUM 9.6 06/15/2023 1656   GFRNONAA >60 06/15/2023 1656   GFRNONAA >60 05/03/2022 1018   GFRAA 114 03/26/2019 1140   Lab Results  Component Value Date   HGBA1C 5.2 08/20/2020   HGBA1C 5.7 (H) 05/25/2017   Lab Results  Component Value Date   INSULIN 7.3 08/20/2020   INSULIN 44.2 (H) 05/25/2017   Lab Results  Component Value Date   TSH 2.88 09/19/2020   CBC    Component Value Date/Time   WBC 14.4 (H) 06/15/2023 1656   RBC 4.50 06/15/2023 1656  HGB 13.4 06/15/2023 1656   HGB 12.5 05/03/2022 1018   HGB 13.6 05/25/2017 1147   HCT 40.8 06/15/2023 1656   HCT 40.7 05/25/2017 1147   PLT 317 06/15/2023 1656   PLT 277 05/03/2022 1018   MCV 90.7 06/15/2023 1656   MCV 91 05/25/2017 1147   MCH 29.8 06/15/2023 1656   MCHC 32.8 06/15/2023 1656   RDW 12.8 06/15/2023 1656   RDW 13.9 05/25/2017 1147   Iron Studies    Component Value Date/Time   FERRITIN 102.0 09/19/2020 0000   Lipid Panel     Component Value Date/Time   CHOL 178 09/24/2020 0000   CHOL 159 03/26/2019 1140   TRIG 50 09/19/2020 0000   HDL 76 (A) 09/24/2020  0000   HDL 66 03/26/2019 1140   LDLCALC 92 09/24/2020 0000   LDLCALC 81 03/26/2019 1140   Hepatic Function Panel     Component Value Date/Time   PROT 6.8 06/15/2023 1656   PROT 6.9 08/20/2020 1325   ALBUMIN 4.0 06/15/2023 1656   ALBUMIN 4.5 08/20/2020 1325   AST 20 06/15/2023 1656   AST 18 05/03/2022 1018   ALT 22 06/15/2023 1656   ALT 15 05/03/2022 1018   ALKPHOS 64 06/15/2023 1656   BILITOT 1.2 06/15/2023 1656   BILITOT 1.1 05/03/2022 1018      Component Value Date/Time   TSH 2.88 09/19/2020 0000   TSH 2.070 05/25/2017 1147   Nutritional Lab Results  Component Value Date   VD25OH 69.5 08/20/2020   VD25OH 64.7 06/26/2019   VD25OH 56.6 03/26/2019     Assessment and Plan Assessment & Plan Obesity with prediabetes Obesity with prediabetes, currently on a calorie-restricted diet of 1600 calories and over 100 grams of protein, followed about 50% of the time. No current exercise regimen. Weight gain of 3 pounds in the last month. Metformin  prescribed for prediabetes, causing dyspepsia and headaches likely due to dehydration. Discussed the option of extended-release metformin  to improve GI tolerance and hunger control. Extended-release metformin  may not provide as effective hunger control but could improve GI symptoms. - Prescribe extended-release metformin  for 90 days - Encourage increased water intake to prevent dehydration - Continue current dietary plan with emphasis on adherence  Right knee osteoarthritis with pain Right knee osteoarthritis with pain, exacerbated despite recent injection in June. Injection efficacy lasted less than expected. Metformin  unlikely to affect knee pain negatively. Discussed potential options with ortho for interim pain management until next injection is possible. - Consult with ortho regarding interim pain management options - Consider topical anti-inflammatory like Voltaren gel  Peripheral edema Peripheral edema noted in feet, with swelling  observed recently. Possible relation to fluid retention and dehydration. Discussed the importance of maintaining hydration to manage edema. - Increase water intake to manage edema  Headache likely related to dehydration Headaches likely related to dehydration, exacerbated by inadequate water intake and possibly heat. Discussed the importance of regular hydration to prevent dehydration-related headaches. Explained that thirst response decreases after menopause, leading to dehydration before thirst is felt. - Increase water intake regularly throughout the day  Metformin -associated dyspepsia Metformin -associated dyspepsia presenting as indigestion and low-grade headaches. Discussed switching to extended-release metformin  to improve GI tolerance. Extended-release metformin  expected to improve upper GI symptoms. - Prescribe extended-release metformin  for 90 days      She was informed of the importance of frequent follow up visits to maximize her success with intensive lifestyle modifications for her multiple health conditions.    Louann Penton, MD

## 2023-11-06 ENCOUNTER — Other Ambulatory Visit (INDEPENDENT_AMBULATORY_CARE_PROVIDER_SITE_OTHER): Payer: Self-pay | Admitting: Family Medicine

## 2023-11-06 DIAGNOSIS — R7303 Prediabetes: Secondary | ICD-10-CM

## 2023-11-07 ENCOUNTER — Inpatient Hospital Stay: Payer: Medicare PPO | Attending: Hematology and Oncology

## 2023-11-07 ENCOUNTER — Inpatient Hospital Stay (HOSPITAL_BASED_OUTPATIENT_CLINIC_OR_DEPARTMENT_OTHER): Payer: Medicare PPO | Admitting: Hematology and Oncology

## 2023-11-07 ENCOUNTER — Encounter: Payer: Self-pay | Admitting: *Deleted

## 2023-11-07 VITALS — BP 134/72 | HR 72 | Temp 97.9°F | Resp 18 | Ht 66.0 in | Wt 245.5 lb

## 2023-11-07 DIAGNOSIS — Z780 Asymptomatic menopausal state: Secondary | ICD-10-CM

## 2023-11-07 DIAGNOSIS — D0512 Intraductal carcinoma in situ of left breast: Secondary | ICD-10-CM

## 2023-11-07 DIAGNOSIS — Z006 Encounter for examination for normal comparison and control in clinical research program: Secondary | ICD-10-CM | POA: Insufficient documentation

## 2023-11-07 LAB — RESEARCH LABS

## 2023-11-07 NOTE — Research (Signed)
 AFT - 25: COMPARING AN OPERATION TO MONITORING, WITH OR WITHOUT ENDOCRINE THERAPY (COMET) FOR LOW RISK DCIS: A PHASE III PROSPECTIVE RANDOMIZED TRIAL    Month 60 (ACTIVE MONITORING arm)-  Patient was into the cancer center this afternoon unaccompanied for her on-study appointment with Dr. Odean. The pt denies any COVID infections since her last visit.     H&P: Dr. Gudena met with the patient and reviewed her recent mammogram findings.  Please see MD's notes from today.     Mammogram: Patient's bilateral mammogram was done on 10/31/23.  Radiologist stated stable x 5 years in left breast and no mammographic evidence of malignancy in the right breast.     Questionnaires: The pt was informed that her next questionnaires for Month 60 will be due in November 2025.  The pt was also informed about additional study questionnaires for 26-months (6 year), 45-months (8 year), and 60-months (10 year).     Research Labs:  Research samples were obtained today.  The pt was informed that her month 97 research samples will be drawn at her next visit.     Medication Review: Patient reviewed and verified her current medication list is correct.  Pt reported starting metformin  for weight loss support.  Patient is not taking any endocrine therapy.    Study Solicited Adverse Events:  The research nurse reviewed the COMET list of solicited Adverse Events with the pt.  Dr. Gudena provided attributions to the AE's on 11/07/23.  See below.    Study/Protocol: AFT-25 COMET/60 month visit   Grade 0 toxicities include the following: allergic reaction, fever, hot flashes, myalgia, acute coronary syndrome, ischemia cerebrovascular, nausea, fracture and osteoporosis.    Not Evaluated toxicities include the following: cholesterol high   Event Grade Onset Date Resolved Date Attribution Treatment Comments  Arthralgia 2 Baseline  Ongoing  Unrelated may seek surgical intervention in the future Baseline comorbidity- pt complains of  ongoing, worsening left knee and left shoulder issues  HTN 1 Baseline  Ongoing Unrelated None    Baseline comorbidity/today's BP 134/72  Breast pain 1 05/03/23 Ongoing   Unrelated  None  Pt states comes and goes. Mild in nature  Alopecia  1 Baseline  Ongoing  Unrelated  None   No change  Pt states her myalgia and fever reported at her last visit has resolved.     Plan: Patient scheduled to return on August 18,2026 for her month 72 visit. The pt's next bilateral imaging is scheduled for August 2026. The pt was encouraged to call Dr. Gudena if she has any questions/concerns.  The pt was told how grateful the COMET study is for her participation and contribution to this study.   Levon FREDRIK Sandifer RN, BSN, CCRP Clinical Research Nurse Lead 11/07/2023 3:12 PM

## 2023-11-07 NOTE — Assessment & Plan Note (Signed)
 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 (Suspicious mass left breast.  Stereotactic biopsy 04/30/2021: Fibrocystic change with calcifications)   Breast cancer surveillance: Breast exam 11/07/2023: Benign Mammogram 05/02/2023: At [facility] benign (0.2 cm residual amorphous calcifications seen previously were not seen on today's images)   Current treatment: Patient decided not to take tamoxifen therapy. We will keep the breast mammograms every 6 months on the left breast.  Return to clinic in 1 year

## 2023-11-07 NOTE — Progress Notes (Signed)
 Patient Care Team: Rolinda Millman, MD as PCP - General (Family Medicine) Tobb, Kardie, DO as PCP - Cardiology (Cardiology) Odean Potts, MD as Consulting Physician (Hematology and Oncology)  DIAGNOSIS:  Encounter Diagnosis  Name Primary?   Ductal carcinoma in situ (DCIS) of left breast Yes    SUMMARY OF ONCOLOGIC HISTORY: Oncology History  Ductal carcinoma in situ (DCIS) of left breast  11/21/2018 Initial Diagnosis   Routine screening mammogram detected calcifications in the left breast. Biopsy showed low-grade DCIS, ER+ 100%, PR+ 100%.      CHIEF COMPLIANT:   HISTORY OF PRESENT ILLNESS:  History of Present Illness Lindsey [name] is a 68 year old female who presents for routine follow-up in the context of active surveillance for ductal carcinoma in situ (DCIS) of the left breast.  She is concerned about the potential loss of funding for the study she is participating in, which may impact her surveillance for DCIS.  Her bone health remains stable, with a bone density scan in 2022 showing normal results and a T-score of -0.4. She plans to coordinate future scans with her mammogram schedule.  A recent colonoscopy revealed a precancerous polyp, necessitating another colonoscopy in five years.     ALLERGIES:  is allergic to lactose intolerance (gi), sulfa antibiotics, and amoxicillin.  MEDICATIONS:  Current Outpatient Medications  Medication Sig Dispense Refill   Cholecalciferol (VITAMIN D3) 125 MCG (5000 UT) CAPS Take 1 capsule by mouth daily.     fluticasone  (FLONASE ) 50 MCG/ACT nasal spray 1 spray in each nostril Nasally Once a day     HYDROcodone -acetaminophen  (NORCO/VICODIN) 5-325 MG tablet Take 1 tablet by mouth every 6 (six) hours as needed for moderate pain (pain score 4-6). 14 tablet 0   metFORMIN  (GLUCOPHAGE -XR) 500 MG 24 hr tablet Take 1 tablet (500 mg total) by mouth daily with breakfast. 90 tablet 0   Multiple Vitamins-Minerals (SENIOR MULTIVITAMIN PLUS PO)  Senior Multivitamin Plus     ondansetron  (ZOFRAN -ODT) 4 MG disintegrating tablet Take 1 tablet (4 mg total) by mouth every 8 (eight) hours as needed for nausea or vomiting. 14 tablet 0   Turmeric Curcumin 500 MG CAPS as directed Orally     No current facility-administered medications for this visit.    PHYSICAL EXAMINATION: ECOG PERFORMANCE STATUS: 1 - Symptomatic but completely ambulatory  Vitals:   11/07/23 1354  BP: 134/72  Pulse: 72  Resp: 18  Temp: 97.9 F (36.6 C)  SpO2: 98%   Filed Weights   11/07/23 1354  Weight: 245 lb 8 oz (111.4 kg)    Physical Exam   (exam performed in the presence of a chaperone)  LABORATORY DATA:  I have reviewed the data as listed    Latest Ref Rng & Units 06/15/2023    4:56 PM 05/03/2022   10:18 AM 02/22/2022   12:12 PM  CMP  Glucose 70 - 99 mg/dL 880  77  96   BUN 8 - 23 mg/dL 25  19  19    Creatinine 0.44 - 1.00 mg/dL 9.28  9.36  9.37   Sodium 135 - 145 mmol/L 138  140  144   Potassium 3.5 - 5.1 mmol/L 3.5  3.9  4.9   Chloride 98 - 111 mmol/L 103  106  104   CO2 22 - 32 mmol/L 23  29  27    Calcium 8.9 - 10.3 mg/dL 9.6  9.6  89.9   Total Protein 6.5 - 8.1 g/dL 6.8  6.9  Total Bilirubin 0.0 - 1.2 mg/dL 1.2  1.1    Alkaline Phos 38 - 126 U/L 64  63    AST 15 - 41 U/L 20  18    ALT 0 - 44 U/L 22  15      Lab Results  Component Value Date   WBC 14.4 (H) 06/15/2023   HGB 13.4 06/15/2023   HCT 40.8 06/15/2023   MCV 90.7 06/15/2023   PLT 317 06/15/2023   NEUTROABS 12.1 (H) 06/15/2023    ASSESSMENT & PLAN:  Ductal carcinoma in situ (DCIS) of left breast 11/21/2018: Screening detected left breast mammogram revealing calcifications: Biopsy revealed low-grade DCIS ER 100%, PR 100% Tis NX stage 0   Treatment plan: Comet clinical trial participation (active monitoring arm)   Breast cancer surveillance: Mammogram at [facility] 10/21/2021: Stable appearance of the left breast of previous biopsy-proven DCIS. (Suspicious mass left breast.   Stereotactic biopsy 04/30/2021: Fibrocystic change with calcifications)   Breast cancer surveillance: Mammogram 05/02/2023: At [facility] benign (0.2 cm residual amorphous calcifications seen previously were not seen on today's images)   Current treatment: Patient decided not to take tamoxifen therapy. Once your mammograms will be ordered for August next year.  We would also like to order a bone density test at [facility] along with her mammogram next year.  Return to clinic in 1 year     No orders of the defined types were placed in this encounter.  The patient has a good understanding of the overall plan. she agrees with it. she will call with any problems that may develop before the next visit here. Total time spent: 30 mins including face to face time and time spent for planning, charting and co-ordination of care   Viinay K Charnel Giles, MD 11/07/23

## 2023-11-08 ENCOUNTER — Other Ambulatory Visit: Payer: Self-pay | Admitting: *Deleted

## 2023-11-08 DIAGNOSIS — D0512 Intraductal carcinoma in situ of left breast: Secondary | ICD-10-CM

## 2023-11-22 ENCOUNTER — Encounter (HOSPITAL_COMMUNITY): Payer: Self-pay | Admitting: Internal Medicine

## 2023-11-22 ENCOUNTER — Emergency Department (HOSPITAL_COMMUNITY)

## 2023-11-22 ENCOUNTER — Observation Stay (HOSPITAL_COMMUNITY)
Admission: EM | Admit: 2023-11-22 | Discharge: 2023-11-24 | Disposition: A | Discharge status: Home / Self Care | Attending: Internal Medicine | Admitting: Internal Medicine

## 2023-11-22 ENCOUNTER — Other Ambulatory Visit: Payer: Self-pay

## 2023-11-22 DIAGNOSIS — K810 Acute cholecystitis: Secondary | ICD-10-CM | POA: Diagnosis not present

## 2023-11-22 DIAGNOSIS — Z6838 Body mass index (BMI) 38.0-38.9, adult: Secondary | ICD-10-CM | POA: Insufficient documentation

## 2023-11-22 DIAGNOSIS — R7303 Prediabetes: Secondary | ICD-10-CM | POA: Diagnosis not present

## 2023-11-22 DIAGNOSIS — E66812 Obesity, class 2: Secondary | ICD-10-CM | POA: Insufficient documentation

## 2023-11-22 DIAGNOSIS — K8050 Calculus of bile duct without cholangitis or cholecystitis without obstruction: Principal | ICD-10-CM | POA: Diagnosis present

## 2023-11-22 DIAGNOSIS — Z87891 Personal history of nicotine dependence: Secondary | ICD-10-CM | POA: Diagnosis not present

## 2023-11-22 DIAGNOSIS — I959 Hypotension, unspecified: Secondary | ICD-10-CM | POA: Insufficient documentation

## 2023-11-22 DIAGNOSIS — R1084 Generalized abdominal pain: Secondary | ICD-10-CM | POA: Diagnosis not present

## 2023-11-22 DIAGNOSIS — R1010 Upper abdominal pain, unspecified: Secondary | ICD-10-CM | POA: Diagnosis not present

## 2023-11-22 DIAGNOSIS — R110 Nausea: Secondary | ICD-10-CM | POA: Diagnosis not present

## 2023-11-22 DIAGNOSIS — K8020 Calculus of gallbladder without cholecystitis without obstruction: Secondary | ICD-10-CM | POA: Diagnosis not present

## 2023-11-22 DIAGNOSIS — D0512 Intraductal carcinoma in situ of left breast: Secondary | ICD-10-CM | POA: Insufficient documentation

## 2023-11-22 DIAGNOSIS — F1090 Alcohol use, unspecified, uncomplicated: Secondary | ICD-10-CM | POA: Diagnosis not present

## 2023-11-22 DIAGNOSIS — K5730 Diverticulosis of large intestine without perforation or abscess without bleeding: Secondary | ICD-10-CM | POA: Diagnosis not present

## 2023-11-22 DIAGNOSIS — R1013 Epigastric pain: Secondary | ICD-10-CM | POA: Diagnosis not present

## 2023-11-22 DIAGNOSIS — R109 Unspecified abdominal pain: Secondary | ICD-10-CM | POA: Diagnosis not present

## 2023-11-22 DIAGNOSIS — K8000 Calculus of gallbladder with acute cholecystitis without obstruction: Secondary | ICD-10-CM | POA: Diagnosis not present

## 2023-11-22 DIAGNOSIS — R9431 Abnormal electrocardiogram [ECG] [EKG]: Secondary | ICD-10-CM | POA: Diagnosis not present

## 2023-11-22 LAB — I-STAT CHEM 8, ED
BUN: 18 mg/dL (ref 8–23)
Calcium, Ion: 1.11 mmol/L — ABNORMAL LOW (ref 1.15–1.40)
Chloride: 104 mmol/L (ref 98–111)
Creatinine, Ser: 0.7 mg/dL (ref 0.44–1.00)
Glucose, Bld: 142 mg/dL — ABNORMAL HIGH (ref 70–99)
HCT: 39 % (ref 36.0–46.0)
Hemoglobin: 13.3 g/dL (ref 12.0–15.0)
Potassium: 3.7 mmol/L (ref 3.5–5.1)
Sodium: 141 mmol/L (ref 135–145)
TCO2: 26 mmol/L (ref 22–32)

## 2023-11-22 LAB — COMPREHENSIVE METABOLIC PANEL WITH GFR
ALT: 16 U/L (ref 0–44)
AST: 19 U/L (ref 15–41)
Albumin: 3.8 g/dL (ref 3.5–5.0)
Alkaline Phosphatase: 66 U/L (ref 38–126)
Anion gap: 12 (ref 5–15)
BUN: 16 mg/dL (ref 8–23)
CO2: 23 mmol/L (ref 22–32)
Calcium: 9.2 mg/dL (ref 8.9–10.3)
Chloride: 104 mmol/L (ref 98–111)
Creatinine, Ser: 0.7 mg/dL (ref 0.44–1.00)
GFR, Estimated: >60 mL/min (ref >60)
Glucose, Bld: 142 mg/dL — ABNORMAL HIGH (ref 70–99)
Potassium: 3.6 mmol/L (ref 3.5–5.1)
Sodium: 139 mmol/L (ref 135–145)
Total Bilirubin: 1.9 mg/dL — ABNORMAL HIGH (ref 0.0–1.2)
Total Protein: 6.9 g/dL (ref 6.5–8.1)

## 2023-11-22 LAB — CBC WITH DIFFERENTIAL/PLATELET
Abs Immature Granulocytes: 0.04 K/uL (ref 0.00–0.07)
Basophils Absolute: 0 K/uL (ref 0.0–0.1)
Basophils Relative: 0 %
Eosinophils Absolute: 0 K/uL (ref 0.0–0.5)
Eosinophils Relative: 0 %
HCT: 38.4 % (ref 36.0–46.0)
Hemoglobin: 12.5 g/dL (ref 12.0–15.0)
Immature Granulocytes: 0 %
Lymphocytes Relative: 8 %
Lymphs Abs: 1 K/uL (ref 0.7–4.0)
MCH: 30.8 pg (ref 26.0–34.0)
MCHC: 32.6 g/dL (ref 30.0–36.0)
MCV: 94.6 fL (ref 80.0–100.0)
Monocytes Absolute: 0.8 K/uL (ref 0.1–1.0)
Monocytes Relative: 7 %
Neutro Abs: 10.3 K/uL — ABNORMAL HIGH (ref 1.7–7.7)
Neutrophils Relative %: 85 %
Platelets: 303 K/uL (ref 150–400)
RBC: 4.06 MIL/uL (ref 3.87–5.11)
RDW: 12.6 % (ref 11.5–15.5)
WBC: 12.3 K/uL — ABNORMAL HIGH (ref 4.0–10.5)
nRBC: 0 % (ref 0.0–0.2)

## 2023-11-22 LAB — LIPASE, BLOOD: Lipase: 32 U/L (ref 11–51)

## 2023-11-22 LAB — TROPONIN I (HIGH SENSITIVITY): Troponin I (High Sensitivity): 2 ng/L (ref <18)

## 2023-11-22 MED ORDER — IOHEXOL 350 MG/ML SOLN
75.0000 mL | Freq: Once | INTRAVENOUS | Status: AC | PRN
  Administered 2023-11-22: 75 mL via INTRAVENOUS

## 2023-11-22 MED ORDER — HYDROMORPHONE HCL 1 MG/ML IJ SOLN
1.0000 mg | INTRAMUSCULAR | Status: DC | PRN
  Administered 2023-11-23 (×2): 1 mg via INTRAVENOUS
  Filled 2023-11-22 (×2): qty 1

## 2023-11-22 MED ORDER — ONDANSETRON HCL 4 MG/2ML IJ SOLN
4.0000 mg | Freq: Once | INTRAMUSCULAR | Status: AC
  Administered 2023-11-22: 4 mg via INTRAVENOUS
  Filled 2023-11-22: qty 2

## 2023-11-22 MED ORDER — KETOROLAC TROMETHAMINE 15 MG/ML IJ SOLN
15.0000 mg | Freq: Once | INTRAMUSCULAR | Status: AC
  Administered 2023-11-22: 15 mg via INTRAVENOUS
  Filled 2023-11-22: qty 1

## 2023-11-22 MED ORDER — LACTATED RINGERS IV BOLUS
1000.0000 mL | Freq: Once | INTRAVENOUS | Status: AC
  Administered 2023-11-22: 1000 mL via INTRAVENOUS

## 2023-11-22 MED ORDER — ACETAMINOPHEN 500 MG PO TABS
1000.0000 mg | ORAL_TABLET | Freq: Four times a day (QID) | ORAL | Status: DC
  Administered 2023-11-22 – 2023-11-24 (×4): 1000 mg via ORAL
  Filled 2023-11-22 (×4): qty 2

## 2023-11-22 MED ORDER — ONDANSETRON HCL 4 MG/2ML IJ SOLN: 4.0000 mg | Freq: Four times a day (QID) | INTRAMUSCULAR | Status: DC | PRN

## 2023-11-22 MED ORDER — SODIUM CHLORIDE 0.9 % IV SOLN
2.0000 g | Freq: Once | INTRAVENOUS | Status: AC
  Administered 2023-11-22: 2 g via INTRAVENOUS
  Filled 2023-11-22: qty 12.5

## 2023-11-22 MED ORDER — POLYETHYLENE GLYCOL 3350 17 G PO PACK: 17.0000 g | PACK | Freq: Every day | ORAL | Status: DC | PRN

## 2023-11-22 MED ORDER — ALBUTEROL SULFATE (2.5 MG/3ML) 0.083% IN NEBU: 2.5000 mg | INHALATION_SOLUTION | RESPIRATORY_TRACT | Status: DC | PRN

## 2023-11-22 MED ORDER — HYDROMORPHONE HCL 1 MG/ML IJ SOLN
1.0000 mg | Freq: Once | INTRAMUSCULAR | Status: AC
  Administered 2023-11-22: 1 mg via INTRAVENOUS
  Filled 2023-11-22: qty 1

## 2023-11-22 MED ORDER — SODIUM CHLORIDE 0.9 % IV SOLN
2.0000 g | Freq: Three times a day (TID) | INTRAVENOUS | Status: DC
  Administered 2023-11-23 (×2): 2 g via INTRAVENOUS
  Filled 2023-11-22 (×2): qty 12.5

## 2023-11-22 MED ORDER — MELATONIN 3 MG PO TABS: 6.0000 mg | ORAL_TABLET | Freq: Every evening | ORAL | Status: DC | PRN

## 2023-11-22 MED ORDER — METRONIDAZOLE 500 MG/100ML IV SOLN
500.0000 mg | Freq: Once | INTRAVENOUS | Status: AC
  Administered 2023-11-22: 500 mg via INTRAVENOUS
  Filled 2023-11-22: qty 100

## 2023-11-22 MED ORDER — CHLORHEXIDINE GLUCONATE CLOTH 2 % EX PADS
6.0000 | MEDICATED_PAD | Freq: Once | CUTANEOUS | Status: AC
  Administered 2023-11-22: 6 via TOPICAL

## 2023-11-22 MED ORDER — CHLORHEXIDINE GLUCONATE CLOTH 2 % EX PADS
6.0000 | MEDICATED_PAD | Freq: Once | CUTANEOUS | Status: AC
  Administered 2023-11-23: 6 via TOPICAL

## 2023-11-22 MED ORDER — INDOCYANINE GREEN 25 MG IV SOLR
1.2500 mg | Freq: Once | INTRAVENOUS | Status: AC
  Administered 2023-11-23: 1.25 mg via INTRAVENOUS
  Filled 2023-11-22: qty 10

## 2023-11-22 MED ORDER — ORAL CARE MOUTH RINSE: 15.0000 mL | OROMUCOSAL | Status: DC | PRN

## 2023-11-22 MED ORDER — SODIUM CHLORIDE 0.9% FLUSH
3.0000 mL | Freq: Two times a day (BID) | INTRAVENOUS | Status: DC
  Administered 2023-11-22 – 2023-11-24 (×4): 3 mL via INTRAVENOUS

## 2023-11-22 MED ORDER — PANTOPRAZOLE SODIUM 40 MG IV SOLR
40.0000 mg | Freq: Once | INTRAVENOUS | Status: AC
  Administered 2023-11-22: 40 mg via INTRAVENOUS

## 2023-11-22 MED ORDER — KETOROLAC TROMETHAMINE 15 MG/ML IJ SOLN
15.0000 mg | Freq: Four times a day (QID) | INTRAMUSCULAR | Status: AC
  Administered 2023-11-22 – 2023-11-23 (×3): 15 mg via INTRAVENOUS
  Filled 2023-11-22 (×3): qty 1

## 2023-11-22 MED ORDER — ONDANSETRON HCL 4 MG/2ML IJ SOLN
INTRAMUSCULAR | Status: AC
  Administered 2023-11-22: 4 mg via INTRAVENOUS
  Filled 2023-11-22: qty 2

## 2023-11-22 MED ORDER — OXYCODONE HCL 5 MG PO TABS
5.0000 mg | ORAL_TABLET | ORAL | Status: DC | PRN
  Administered 2023-11-22 – 2023-11-23 (×2): 5 mg via ORAL
  Filled 2023-11-22 (×2): qty 1

## 2023-11-22 MED ORDER — INSULIN ASPART 100 UNIT/ML IJ SOLN: 0.0000 [IU] | Freq: Three times a day (TID) | INTRAMUSCULAR | Status: DC

## 2023-11-22 MED ORDER — METRONIDAZOLE 500 MG/100ML IV SOLN
500.0000 mg | Freq: Two times a day (BID) | INTRAVENOUS | Status: DC
  Administered 2023-11-23: 500 mg via INTRAVENOUS
  Filled 2023-11-22: qty 100

## 2023-11-22 MED ORDER — LACTATED RINGERS IV SOLN: INTRAVENOUS | Status: AC

## 2023-11-22 MED ORDER — FENTANYL CITRATE PF 50 MCG/ML IJ SOSY
50.0000 ug | PREFILLED_SYRINGE | Freq: Once | INTRAMUSCULAR | Status: AC
  Administered 2023-11-22: 50 ug via INTRAVENOUS
  Filled 2023-11-22: qty 1

## 2023-11-22 NOTE — ED Provider Notes (Signed)
 Patient signed out to me by previous provider. Please refer to their note for full HPI.  Briefly this is a 68 year old female who presented with epigastric pain.  Found to have a gallstone, no findings of acute cholecystitis.  Lab work is reassuring however symptoms not controlled.  Concern for biliary colic.   Patient signed out pending general surgery evaluation.   ED Course: General surgery has evaluated.  They recommend medicine admission and will continue to monitor with plan for possible surgical intervention tomorrow.  Vitals remained stable.  Currently patient is resting in bed.  Patients evaluation and results requires admission for further treatment and care.  Spoke with hospitalist, reviewed patient's ED course and they accept admission.  Patient agrees with admission plan, offers no new complaints and is stable/unchanged at time of admit.   Bari Roxie HERO, [place] 11/22/23 [id number]

## 2023-11-22 NOTE — ED Triage Notes (Signed)
 C/o 10/10 epigastric pain that is recurrent. Feels like it is going up to her esophagus. Took gas-x and ibuprofen with no relief. Nothing makes it better or worse. Had N/V episode with with ems, yellow colored emesis. EMS gave IV zofran . VSS, CBG 128. Pt takes metformin for appetite suppression

## 2023-11-22 NOTE — ED Notes (Addendum)
 Pt spO2 dropped to 85% on RA while sleeping. 2.5L [place] applied w/ spO2 @94 %

## 2023-11-22 NOTE — H&P (Signed)
 Lindsey [name] 04/20/1955  [id number].    Requesting MD: Mannie HAS; Horton, MD Chief Complaint/Reason for Consult: biliary colic vs cholecystitis   HPI:  Lindsey [name] is a 68 y/o F with PMH obesity, prediabetes, and DCIS left breast under surveillance who presents with epigastric pain. Pain started this morning, described as 10/10 buring epigastric pain. Took ibuprofen and gas-x without releif of pain. She has had nausea/vomiting. Reports one similar episode of pain in March, but this time the pain is more severe. In March her pain was more mid to left sided. Today she has epigastric and mid abdominal pain and then very lateral RUQ pain. Denies fever, chills, urinary symptoms, or blood in her stool. Denies use of blood thinners. Denies history of previous abdominal surgery. She reports occasional EtOH use (1-2x/month) and is a former cigarette smoker.   Workup notable for leukocytosis to 12.3, Tbili of 1.9. CT with single large gallstone without evidence of cholecystitis. Pancreas appeared normal on CT however lipase was not ordered and significant amount of epigastric/mid upper abdominal pain. US  also without evidence of acute cholecystitis.   ROS: Review of Systems  All other systems reviewed and are negative.   Family History  Problem Relation Age of Onset   High blood pressure Mother    Depression Mother    Obesity Mother    Diabetes Father    Heart disease Father    Cancer Father    Sleep apnea Father    Obesity Father     Past Medical History:  Diagnosis Date   Dry skin    H/O seasonal allergies    Hair loss    Knee pain    Multiple stiff joints    Nasal congestion    Skin tag    Snoring    Urgency of urination     Past Surgical History:  Procedure Laterality Date   FOOT SURGERY     TONSILLECTOMY      Social History:  reports that she has quit smoking. Her smoking use included cigarettes. She has a 15 pack-year smoking history. She has never used smokeless  tobacco. She reports current alcohol use. She reports that she does not use drugs.  Allergies:  Allergies  Allergen Reactions   Lactose Intolerance (Gi) Other (See Comments)    Cramping   Sulfa Antibiotics    Amoxicillin Rash    (Not in a hospital admission)    Physical Exam: Blood pressure (!) 91/51, pulse 85, temperature 98 F (36.7 C), resp. rate 18, height 5' 6 (1.676 m), SpO2 91%. General: Female laying on hospital bed, appears stated age, mild distress secondary to pain and nausea HEENT: head -normocephalic, atraumatic; Eyes: PERRLA, no conjunctival injection, anicteric sclerae Neck- Trachea is midline CV- RRR Pulm- breathing is non-labored ORA Abd- soft, TTP primarily in epigastrium, as well as RUQ GU- deferred  MSK- UE/LE symmetrical, no cyanosis, clubbing, or edema. Neuro- No focal neurologic deficits Psych- Alert and Oriented x3 with appropriate affect Skin: warm and dry, no rashes or lesions   Results for orders placed or performed during the hospital encounter of 11/22/23 (from the past 48 hours)  Comprehensive metabolic panel     Status: Abnormal   Collection Time: 11/22/23 11:49 AM  Result Value Ref Range   Sodium 139 135 - 145 mmol/L   Potassium 3.6 3.5 - 5.1 mmol/L   Chloride 104 98 - 111 mmol/L   CO2 23 22 - 32 mmol/L   Glucose, Bld 142 (  H) 70 - 99 mg/dL    Comment: Glucose reference range applies only to samples taken after fasting for at least 8 hours.   BUN 16 8 - 23 mg/dL   Creatinine, Ser 9.29 0.44 - 1.00 mg/dL   Calcium 9.2 8.9 - 89.6 mg/dL   Total Protein 6.9 6.5 - 8.1 g/dL   Albumin 3.8 3.5 - 5.0 g/dL   AST 19 15 - 41 U/L   ALT 16 0 - 44 U/L   Alkaline Phosphatase 66 38 - 126 U/L   Total Bilirubin 1.9 (H) 0.0 - 1.2 mg/dL   GFR, Estimated >39 >39 mL/min    Comment: (NOTE) Calculated using the CKD-EPI Creatinine Equation (2021)    Anion gap 12 5 - 15    Comment: Performed at [facility], [address] [place], [place] [address]   CBC with Differential     Status: Abnormal   Collection Time: 11/22/23 11:49 AM  Result Value Ref Range   WBC 12.3 (H) 4.0 - 10.5 K/uL   RBC 4.06 3.87 - 5.11 MIL/uL   Hemoglobin 12.5 12.0 - 15.0 g/dL   HCT 61.5 63.9 - 53.9 %   MCV 94.6 80.0 - 100.0 fL   MCH 30.8 26.0 - 34.0 pg   MCHC 32.6 30.0 - 36.0 g/dL   RDW 87.3 88.4 - 84.4 %   Platelets 303 150 - 400 K/uL   nRBC 0.0 0.0 - 0.2 %   Neutrophils Relative % 85 %   Neutro Abs 10.3 (H) 1.7 - 7.7 K/uL   Lymphocytes Relative 8 %   Lymphs Abs 1.0 0.7 - 4.0 K/uL   Monocytes Relative 7 %   Monocytes Absolute 0.8 0.1 - 1.0 K/uL   Eosinophils Relative 0 %   Eosinophils Absolute 0.0 0.0 - 0.5 K/uL   Basophils Relative 0 %   Basophils Absolute 0.0 0.0 - 0.1 K/uL   Immature Granulocytes 0 %   Abs Immature Granulocytes 0.04 0.00 - 0.07 K/uL    Comment: Performed at [facility], [address] [place], [place] [address]  Troponin I (High Sensitivity)     Status: None   Collection Time: 11/22/23 11:49 AM  Result Value Ref Range   Troponin I (High Sensitivity) 2 <18 ng/L    Comment: (NOTE) Elevated high sensitivity troponin I (hsTnI) values and significant  changes across serial measurements may suggest ACS but many other  chronic and acute conditions are known to elevate hsTnI results.  Refer to the Links section for chest pain algorithms and additional  guidance. Performed at [facility], [address], [place], [place] [address]   I-stat chem 8, ED (not at [facility], [facility] or [facility])     Status: Abnormal   Collection Time: 11/22/23 11:57 AM  Result Value Ref Range   Sodium 141 135 - 145 mmol/L   Potassium 3.7 3.5 - 5.1 mmol/L   Chloride 104 98 - 111 mmol/L   BUN 18 8 - 23 mg/dL   Creatinine, Ser 9.29 0.44 - 1.00 mg/dL   Glucose, Bld 857 (H) 70 - 99 mg/dL    Comment: Glucose reference range applies only to samples taken after fasting for at least 8 hours.   Calcium, Ion 1.11 (L) 1.15 - 1.40 mmol/L   TCO2 26 22 - 32  mmol/L   Hemoglobin 13.3 12.0 - 15.0 g/dL   HCT 60.9 63.9 - 53.9 %   CT ABDOMEN PELVIS W CONTRAST Result Date: 11/22/2023  CLINICAL DATA:  Epigastric pain EXAM: CT ABDOMEN AND PELVIS WITH CONTRAST TECHNIQUE: Multidetector CT imaging of the abdomen and pelvis was performed using the standard protocol following bolus administration of intravenous contrast. RADIATION DOSE REDUCTION: This exam was performed according to the departmental dose-optimization program which includes automated exposure control, adjustment of the mA and/or kV according to patient size and/or use of iterative reconstruction technique. CONTRAST:  75mL OMNIPAQUE  IOHEXOL  350 MG/ML SOLN COMPARISON:  None Available. FINDINGS: Lower chest: No significant abnormality Hepatobiliary: No liver lesions. No intrahepatic biliary dilatation. Gallbladder: Single large gallstone noted without evidence of cholecystitis Pancreas: Normal Spleen: Normal Adrenal glands: Normal. Kidneys: Normal Stomach/duodenum: Normal Small & large bowel: There is sigmoid diverticulosis with evidence of diverticulitis. No abscess. Vascular/Lymphatic: No significant vascular findings are present. No enlarged abdominal or pelvic lymph nodes. Pelvis: The bladder appears normal. The uterus is normal. No adnexal mass Other: None Musculoskeletal: No bone lesion, fracture or other significant abnormality IMPRESSION: 1. There is a single large gallstone without evidence of acute cholecystitis. No evidence of biliary obstruction 2. Extensive sigmoid diverticulosis with evidence of diverticulitis without abscess. Electronically Signed   By: Nancyann Burns M.D.   On: 11/22/2023 14:59   US  Abdomen Limited RUQ (LIVER/GB) Result Date: 11/22/2023 CLINICAL DATA:  Abdominal pain EXAM: ULTRASOUND ABDOMEN LIMITED RIGHT UPPER QUADRANT COMPARISON:  CT abdomen pelvis 06/15/2023 FINDINGS: Gallbladder: Gallstones: Mild cholelithiasis Sludge: None Gallbladder Wall: Within normal limits Pericholecystic  fluid: None Sonographic Murphy's Sign: Negative per technologist Common bile duct: Diameter: 7 mm Liver: Parenchymal echogenicity: Within normal limits Contours: Normal Lesions: None Portal vein: Patent.  Hepatopetal flow Other: None. IMPRESSION: Mild cholelithiasis without additional sonographic evidence of acute cholecystitis. Electronically Signed   By: Aliene Lloyd M.D.   On: 11/22/2023 13:49      Assessment/Plan 68 yo F with biliary colic, suspect acute calculous cholecystitis   - CLD, NPO at midnight - Abx given clinically consistent with cholecystitis given elevated WBC, significant persistent pain - Multimodal pain control - Appreciate medicine admission - Repeat AM LFTs, lipase (add on lipase this evening resulted as normal, consistent with normal appearing pancreas on CT). - Proceed to OR in AM for lap chole +/- IOC - We discussed the etiology of patient's pain, we discussed treatment options and recommended surgery. We discussed details of surgery including general anesthesia, laparoscopic approach, identification of cystic duct and common bile duct. Ligation of cystic duct and cystic artery. Possible need for intraoperative cholangiogram, open procedure, and subtotal cholecystectomy. Possible risks of common bile duct injury, injury to surrounding structures, bile leak, bleeding, infection, diarrhea, retained stone and hernia. The patient showed good understanding and all questions were answered    I reviewed ED provider notes, last 24 h vitals and pain scores, last 48 h intake and output, last 24 h labs and trends, and last 24 h imaging results.  Orie Silversmith, MD [facility] Surgery

## 2023-11-22 NOTE — ED Notes (Signed)
 Assuming care of pt at this time. Pt is AAOx4, currently vomiting

## 2023-11-22 NOTE — ED Provider Notes (Signed)
 [place] EMERGENCY DEPARTMENT AT [facility] Provider Note   CSN: [id number] Arrival date & time: 11/22/23  1140     Patient presents with: Abdominal Pain   Lindsey [name] is a 68 y.o. female.  {Add pertinent medical, surgical, social history, OB history to HPI:32947} This is a 68 year old female who is here today for abdominal pain.  Patient said that she had 10 of 10 sudden onset epigastric pain.  She has had a previous episode in March.  At that time her workup was unremarkable.  Patient takes metformin  which she reports is for appetite suppression.   Abdominal Pain      Prior to Admission medications   Medication Sig Start Date End Date Taking? Authorizing Provider  Cholecalciferol (VITAMIN D3) 125 MCG (5000 UT) CAPS Take 1 capsule by mouth daily.    [provider]  fluticasone  (FLONASE ) 50 MCG/ACT nasal spray 1 spray in each nostril Nasally Once a day    [provider]  HYDROcodone -acetaminophen  (NORCO/VICODIN) 5-325 MG tablet Take 1 tablet by mouth every 6 (six) hours as needed for moderate pain (pain score 4-6). 06/15/23   Zackowski, Scott, MD  metFORMIN  (GLUCOPHAGE -XR) 500 MG 24 hr tablet Take 1 tablet (500 mg total) by mouth daily with breakfast. 11/03/23   Verdon Parry D, MD  Multiple Vitamins-Minerals (SENIOR MULTIVITAMIN PLUS PO) Senior Multivitamin Plus    [provider]  ondansetron  (ZOFRAN -ODT) 4 MG disintegrating tablet Take 1 tablet (4 mg total) by mouth every 8 (eight) hours as needed for nausea or vomiting. 06/15/23   Zackowski, Scott, MD  Turmeric Curcumin 500 MG CAPS as directed Orally    [provider]    Allergies: Lactose intolerance (gi), Sulfa antibiotics, and Amoxicillin    Review of Systems  Gastrointestinal:  Positive for abdominal pain.    Updated Vital Signs BP 115/82   Pulse 67   Temp 98 F (36.7 C)   Resp 15   Ht 5' 6 (1.676 m)   SpO2 99%   BMI 39.62 kg/m   Physical Exam Vitals and  nursing note reviewed.  Constitutional:      Appearance: She is ill-appearing.  Abdominal:     General: Abdomen is flat.     Palpations: Abdomen is soft.     Tenderness: There is abdominal tenderness in the right upper quadrant.  Neurological:     Mental Status: She is alert.     (all labs ordered are listed, but only abnormal results are displayed) Labs Reviewed  COMPREHENSIVE METABOLIC PANEL WITH GFR - Abnormal; Notable for the following components:      Result Value   Glucose, Bld 142 (*)    Total Bilirubin 1.9 (*)    All other components within normal limits  CBC WITH DIFFERENTIAL/PLATELET - Abnormal; Notable for the following components:   WBC 12.3 (*)    Neutro Abs 10.3 (*)    All other components within normal limits  I-STAT CHEM 8, ED - Abnormal; Notable for the following components:   Glucose, Bld 142 (*)    Calcium, Ion 1.11 (*)    All other components within normal limits  TROPONIN I (HIGH SENSITIVITY)  TROPONIN I (HIGH SENSITIVITY)    EKG: EKG Interpretation Date/Time:  Tuesday November 22 2023 11:40:48 EDT Ventricular Rate:  72 PR Interval:  202 QRS Duration:  119 QT Interval:  386 QTC Calculation: 423 R Axis:   72  Text Interpretation: Sinus rhythm Probable left atrial enlargement Incomplete right bundle  branch block ST elevation, consider inferior injury Confirmed by Mannie Pac [phone]) on 11/22/2023 1:09:10 PM  Radiology: No results found.  {Document cardiac monitor, telemetry assessment procedure when appropriate:32947} Procedures   Medications Ordered in the ED  pantoprazole  (PROTONIX ) injection 40 mg (has no administration in time range)  HYDROmorphone  (DILAUDID ) injection 1 mg (has no administration in time range)  ketorolac  (TORADOL ) 15 MG/ML injection 15 mg (has no administration in time range)  ondansetron  (ZOFRAN ) injection 4 mg (has no administration in time range)  fentaNYL  (SUBLIMAZE ) injection 50 mcg (50 mcg Intravenous Given 11/22/23  1152)  ondansetron  (ZOFRAN ) injection 4 mg (4 mg Intravenous Given 11/22/23 1151)      {Click here for ABCD2, HEART and other calculators REFRESH Note before signing:1}                              Medical Decision Making 68 year old female here today for abdominal pain.  Differential diagnoses include biliary colic, cholecystitis, peptic ulcer disease, ACS, less likely dissection.  Plan-on exam, patient quite uncomfortable.  Has some epigastric pain and right upper quadrant pain.  Suspicion is for biliary disease.  Will obtain blood work, imaging of the patient's right upper quadrant as well as CT imaging of the abdomen pelvis.  I am also considering cardiac cause.  Amount and/or Complexity of Data Reviewed Labs: ordered. Radiology: ordered.  Risk Prescription drug management.   ***  {Document critical care time when appropriate  Document review of labs and clinical decision tools ie CHADS2VASC2, etc  Document your independent review of radiology images and any outside records  Document your discussion with family members, caretakers and with consultants  Document social determinants of health affecting pt's care  Document your decision making why or why not admission, treatments were needed:32947:::1}   Final diagnoses:  None    ED Discharge Orders     None

## 2023-11-22 NOTE — ED Notes (Signed)
 Patient transported to Ultrasound

## 2023-11-22 NOTE — Anesthesia Preprocedure Evaluation (Signed)
 Anesthesia Evaluation  Patient identified by MRN, date of birth, ID band Patient awake    Reviewed: Allergy & Precautions, H&P , NPO status , Patient's Chart, lab work & pertinent test results  Airway Mallampati: II  TM Distance: >3 FB Neck ROM: Full    Dental no notable dental hx.    Pulmonary former smoker   Pulmonary exam normal breath sounds clear to auscultation       Cardiovascular hypertension, Normal cardiovascular exam Rhythm:Regular Rate:Normal     Neuro/Psych  PSYCHIATRIC DISORDERS  Depression    negative neurological ROS     GI/Hepatic Neg liver ROS,,,biliary colic   Endo/Other  negative endocrine ROS    Renal/GU negative Renal ROS  negative genitourinary   Musculoskeletal negative musculoskeletal ROS (+)    Abdominal   Peds negative pediatric ROS (+)  Hematology negative hematology ROS (+)   Anesthesia Other Findings   Reproductive/Obstetrics negative OB ROS Ductal carcinoma in situ (DCIS) of left breast                              Anesthesia Physical Anesthesia Plan  ASA: 3  Anesthesia Plan: General   Post-op Pain Management: Tylenol  PO (pre-op)*   Induction: Intravenous  PONV Risk Score and Plan: 3 and Treatment may vary due to age or medical condition  Airway Management Planned: Oral ETT  Additional Equipment: None  Intra-op Plan:   Post-operative Plan: Extubation in OR  Informed Consent: I have reviewed the patients History and Physical, chart, labs and discussed the procedure including the risks, benefits and alternatives for the proposed anesthesia with the patient or authorized representative who has indicated his/her understanding and acceptance.     Dental advisory given  Plan Discussed with: CRNA  Anesthesia Plan Comments:          Anesthesia Quick Evaluation

## 2023-11-22 NOTE — H&P (Signed)
 History and Physical    Lindsey [name] FMW:[id number] DOB: 1955/11/19 DOA: 11/22/2023  PCP: Rolinda Millman, MD   Patient coming from: Home   Chief Complaint:  Chief Complaint  Patient presents with   Abdominal Pain    HPI:  Lindsey [name] is a 68 y.o. 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 Reports that things started around 930 AM. She had made breakfast but didn't eat it. She was constipated and had cramping around her LLQ / RLQ, but things suddently moved to epigastrium. And then now radiating more towards the RUQ. She has associated nausea, intolerance of water since onset. No fevers, chills. Denies hx of biliary colic symptoms previously. She did have episode 6 MO ago of severe abd pain which never recurred. No hx of abdominal surgeries. She is not on antiPLT or AC.    Review of Systems:  ROS complete and negative except as marked above   Allergies  Allergen Reactions   Lactose Intolerance (Gi) Other (See Comments)    Cramping   Sulfa Antibiotics    Amoxicillin Rash    Prior to Admission medications   Medication Sig Start Date End Date Taking? Authorizing Provider  Cholecalciferol (VITAMIN D3) 125 MCG (5000 UT) CAPS Take 1 capsule by mouth daily.    [provider]  fluticasone  (FLONASE ) 50 MCG/ACT nasal spray 1 spray in each nostril Nasally Once a day    [provider]  HYDROcodone -acetaminophen  (NORCO/VICODIN) 5-325 MG tablet Take 1 tablet by mouth every 6 (six) hours as needed for moderate pain (pain score 4-6). 06/15/23   Zackowski, Scott, MD  metFORMIN  (GLUCOPHAGE -XR) 500 MG 24 hr tablet Take 1 tablet (500 mg total) by mouth daily with breakfast. 11/03/23   Verdon Parry D, MD  Multiple Vitamins-Minerals (SENIOR MULTIVITAMIN PLUS PO) Senior Multivitamin Plus    [provider]  ondansetron  (ZOFRAN -ODT) 4 MG disintegrating tablet Take 1 tablet (4 mg  total) by mouth every 8 (eight) hours as needed for nausea or vomiting. 06/15/23   Zackowski, Scott, MD  Turmeric Curcumin 500 MG CAPS as directed Orally    [provider]    Past Medical History:  Diagnosis Date   Dry skin    H/O seasonal allergies    Hair loss    Knee pain    Multiple stiff joints    Nasal congestion    Skin tag    Snoring    Urgency of urination     Past Surgical History:  Procedure Laterality Date   FOOT SURGERY     TONSILLECTOMY       reports that she has quit smoking. Her smoking use included cigarettes. She has a 15 pack-year smoking history. She has never used smokeless tobacco. She reports current alcohol use. She reports that she does not use drugs.  Family History  Problem Relation Age of Onset   High blood pressure Mother    Depression Mother    Obesity Mother    Diabetes Father    Heart disease Father    Cancer Father    Sleep apnea Father    Obesity Father      Physical Exam: Vitals:   11/22/23 1523 11/22/23 1545 11/22/23 1853 11/22/23 1914  BP:  (!) 115/54 (!) 107/58   Pulse:  90 74   Resp:  16 20   Temp: 98 F (36.7 C)   97.7 F (36.5 C)  TempSrc:    Oral  SpO2:  97% 100%   Height:        Gen: Awake, alert, NAD   CV: Regular, normal S1, S2, no murmurs  Resp: Normal WOB, CTAB  Abd: Obese, normoactive, nontender MSK: Symmetric, no edema  Skin: No rashes or lesions to exposed skin  Neuro: Alert and interactive  Psych: anxious, but appropriate    Data review:   Labs reviewed, notable for:   T bili 1.9, other LFT wnl  WBC 12    Micro:  Results for orders placed or performed in visit on 09/30/20  Fecal occult blood, imunochemical     Status: None   Collection Time: 09/24/20 12:00 AM  Result Value Ref Range Status   IFOBT Negative  Final    Imaging reviewed:  CT ABDOMEN PELVIS W CONTRAST Result Date: 11/22/2023 CLINICAL DATA:  Epigastric pain EXAM: CT ABDOMEN AND PELVIS WITH CONTRAST TECHNIQUE:  Multidetector CT imaging of the abdomen and pelvis was performed using the standard protocol following bolus administration of intravenous contrast. RADIATION DOSE REDUCTION: This exam was performed according to the departmental dose-optimization program which includes automated exposure control, adjustment of the mA and/or kV according to patient size and/or use of iterative reconstruction technique. CONTRAST:  75mL OMNIPAQUE  IOHEXOL  350 MG/ML SOLN COMPARISON:  None Available. FINDINGS: Lower chest: No significant abnormality Hepatobiliary: No liver lesions. No intrahepatic biliary dilatation. Gallbladder: Single large gallstone noted without evidence of cholecystitis Pancreas: Normal Spleen: Normal Adrenal glands: Normal. Kidneys: Normal Stomach/duodenum: Normal Small & large bowel: There is sigmoid diverticulosis with evidence of diverticulitis. No abscess. Vascular/Lymphatic: No significant vascular findings are present. No enlarged abdominal or pelvic lymph nodes. Pelvis: The bladder appears normal. The uterus is normal. No adnexal mass Other: None Musculoskeletal: No bone lesion, fracture or other significant abnormality IMPRESSION: 1. There is a single large gallstone without evidence of acute cholecystitis. No evidence of biliary obstruction 2. Extensive sigmoid diverticulosis with evidence of diverticulitis without abscess. Electronically Signed   By: Nancyann Burns M.D.   On: 11/22/2023 14:59   US  Abdomen Limited RUQ (LIVER/GB) Result Date: 11/22/2023 CLINICAL DATA:  Abdominal pain EXAM: ULTRASOUND ABDOMEN LIMITED RIGHT UPPER QUADRANT COMPARISON:  CT abdomen pelvis 06/15/2023 FINDINGS: Gallbladder: Gallstones: Mild cholelithiasis Sludge: None Gallbladder Wall: Within normal limits Pericholecystic fluid: None Sonographic Murphy's Sign: Negative per technologist Common bile duct: Diameter: 7 mm Liver: Parenchymal echogenicity: Within normal limits Contours: Normal Lesions: None Portal vein: Patent.   Hepatopetal flow Other: None. IMPRESSION: Mild cholelithiasis without additional sonographic evidence of acute cholecystitis. Electronically Signed   By: Aliene Lloyd M.D.   On: 11/22/2023 13:49    EKG:  Personally reviewed, SR, incomplete RBBB, no acute ischemic changes    ED Course:  EDP consulted with General surgery, and suspect calculous cholecystitis, recommending for admission to medicine, abx, NPO after MN for cholecystectomy +/- IOC    Treated with Tordadol, Dilaudid , fentanyl , zofran , pantoprazole , 1 L LR and placed on rate.    Assessment/Plan:  69 y.o. 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 T Bili 1.9, other LFT wnl. WBC 12. CT with large gallstone, no biliary dilation or CT evidence of cholecystitis; however suspected clinically based on her presentation per surgery.  -- EDP consulted with General surgery, seen by Dr. Ann. Plan for cholecystectomy +/- IOC tomorrow    --  CLD, NPO after MN  -- Ordered for Cefepime , Flagyl , mIVF  -- multimodal pain control  -- Trend LFT   Borderline hypotension  -- s/p 1 L IVF, give additional liter and continue on mIVF   Chronic medical problems:  Obesity class II: Following with weight loss clinic OP  PreDM: Hold home metformin  (taking moreso for appetite suppressant). SSI for very sensitive, check A1c  DCIS L breast: On active surveillance   Body mass index is 39.62 kg/m.    DVT prophylaxis:  SCDs Code Status:  Full Code Diet:  Diet Orders (From admission, onward)     Start     Ordered   11/23/23 0001  Diet NPO time specified  Diet effective midnight        11/22/23 1635   11/22/23 1635  Diet clear liquid Room service appropriate? Yes; Fluid consistency: Thin  Diet effective now       Question Answer Comment  Room service  appropriate? Yes   Fluid consistency: Thin      11/22/23 1634           Family Communication:  None   Consults:  Gen surgery   Admission status:   Inpatient, Med-Surg  Severity of Illness: The appropriate patient status for this patient is INPATIENT. Inpatient status is judged to be reasonable and necessary in order to provide the required intensity of service to ensure the patient's safety. The patient's presenting symptoms, physical exam findings, and initial radiographic and laboratory data in the context of their chronic comorbidities is felt to place them at high risk for further clinical deterioration. Furthermore, it is not anticipated that the patient will be medically stable for discharge from the hospital within 2 midnights of admission.   * I certify that at the point of admission it is my clinical judgment that the patient will require inpatient hospital care spanning beyond 2 midnights from the point of admission due to high intensity of service, high risk for further deterioration and high frequency of surveillance required.*   Dorn Dawson, MD [facility]  How to contact the [facility] Attending or Consulting provider 7A - 7P or covering provider during after hours 7P -7A, for this patient.  Check the care team in [facility] and look for a) attending/consulting [facility] provider listed and b) the [facility] team listed Log into [email or web address] and use [facility] universal password to access. If you do not have the password, please contact the hospital operator. Locate the [facility] provider you are looking for under [facility] and page to a number that you can be directly reached. If you still have difficulty reaching the provider, please page the [facility] (Director on Call) for the Hospitalists listed on amion for assistance.  11/22/2023, 7:33 PM

## 2023-11-23 ENCOUNTER — Other Ambulatory Visit: Payer: Self-pay

## 2023-11-23 ENCOUNTER — Ambulatory Visit (HOSPITAL_COMMUNITY): Payer: Self-pay

## 2023-11-23 ENCOUNTER — Encounter (HOSPITAL_COMMUNITY)
Admission: EM | Disposition: A | Discharge status: Home / Self Care | Payer: Self-pay | Source: Home / Self Care | Attending: Emergency Medicine

## 2023-11-23 ENCOUNTER — Inpatient Hospital Stay (HOSPITAL_BASED_OUTPATIENT_CLINIC_OR_DEPARTMENT_OTHER): Payer: Self-pay

## 2023-11-23 ENCOUNTER — Inpatient Hospital Stay (HOSPITAL_COMMUNITY)

## 2023-11-23 ENCOUNTER — Encounter (HOSPITAL_COMMUNITY): Payer: Self-pay | Admitting: Internal Medicine

## 2023-11-23 DIAGNOSIS — Z87891 Personal history of nicotine dependence: Secondary | ICD-10-CM

## 2023-11-23 DIAGNOSIS — K8050 Calculus of bile duct without cholangitis or cholecystitis without obstruction: Principal | ICD-10-CM | POA: Diagnosis present

## 2023-11-23 DIAGNOSIS — K8000 Calculus of gallbladder with acute cholecystitis without obstruction: Secondary | ICD-10-CM

## 2023-11-23 DIAGNOSIS — F32A Depression, unspecified: Secondary | ICD-10-CM

## 2023-11-23 DIAGNOSIS — K810 Acute cholecystitis: Secondary | ICD-10-CM | POA: Diagnosis not present

## 2023-11-23 DIAGNOSIS — K8010 Calculus of gallbladder with chronic cholecystitis without obstruction: Secondary | ICD-10-CM | POA: Diagnosis not present

## 2023-11-23 DIAGNOSIS — I10 Essential (primary) hypertension: Secondary | ICD-10-CM

## 2023-11-23 HISTORY — PX: CHOLECYSTECTOMY: SHX55

## 2023-11-23 LAB — COMPREHENSIVE METABOLIC PANEL WITH GFR
ALT: 15 U/L (ref 0–44)
AST: 21 U/L (ref 15–41)
Albumin: 3.3 g/dL — ABNORMAL LOW (ref 3.5–5.0)
Alkaline Phosphatase: 57 U/L (ref 38–126)
Anion gap: 9 (ref 5–15)
BUN: 14 mg/dL (ref 8–23)
CO2: 26 mmol/L (ref 22–32)
Calcium: 9 mg/dL (ref 8.9–10.3)
Chloride: 104 mmol/L (ref 98–111)
Creatinine, Ser: 0.62 mg/dL (ref 0.44–1.00)
GFR, Estimated: >60 mL/min (ref >60)
Glucose, Bld: 94 mg/dL (ref 70–99)
Potassium: 3.9 mmol/L (ref 3.5–5.1)
Sodium: 139 mmol/L (ref 135–145)
Total Bilirubin: 1.5 mg/dL — ABNORMAL HIGH (ref 0.0–1.2)
Total Protein: 6.3 g/dL — ABNORMAL LOW (ref 6.5–8.1)

## 2023-11-23 LAB — CBC
HCT: 36.2 % (ref 36.0–46.0)
Hemoglobin: 11.7 g/dL — ABNORMAL LOW (ref 12.0–15.0)
MCH: 30.2 pg (ref 26.0–34.0)
MCHC: 32.3 g/dL (ref 30.0–36.0)
MCV: 93.3 fL (ref 80.0–100.0)
Platelets: 298 K/uL (ref 150–400)
RBC: 3.88 MIL/uL (ref 3.87–5.11)
RDW: 12.7 % (ref 11.5–15.5)
WBC: 10.2 K/uL (ref 4.0–10.5)
nRBC: 0 % (ref 0.0–0.2)

## 2023-11-23 LAB — LIPASE, BLOOD: Lipase: 30 U/L (ref 11–51)

## 2023-11-23 LAB — SURGICAL PCR SCREEN
MRSA, PCR: NEGATIVE
Staphylococcus aureus: NEGATIVE

## 2023-11-23 LAB — HEMOGLOBIN A1C
Hgb A1c MFr Bld: 5.3 % (ref 4.8–5.6)
Mean Plasma Glucose: 105.41 mg/dL

## 2023-11-23 LAB — GLUCOSE, CAPILLARY
Glucose-Capillary: 95 mg/dL (ref 70–99)
Glucose-Capillary: 113 mg/dL — ABNORMAL HIGH (ref 70–99)

## 2023-11-23 LAB — HIV ANTIBODY (ROUTINE TESTING W REFLEX): HIV Screen 4th Generation wRfx: NONREACTIVE

## 2023-11-23 SURGERY — LAPAROSCOPIC CHOLECYSTECTOMY WITH INTRAOPERATIVE CHOLANGIOGRAM
Anesthesia: General | Site: Abdomen

## 2023-11-23 MED ORDER — PHENYLEPHRINE 80 MCG/ML (10ML) SYRINGE FOR IV PUSH (FOR BLOOD PRESSURE SUPPORT)
PREFILLED_SYRINGE | INTRAVENOUS | Status: AC
  Filled 2023-11-23: qty 10

## 2023-11-23 MED ORDER — PHENYLEPHRINE 80 MCG/ML (10ML) SYRINGE FOR IV PUSH (FOR BLOOD PRESSURE SUPPORT)
PREFILLED_SYRINGE | INTRAVENOUS | Status: DC | PRN
  Administered 2023-11-23: 320 ug via INTRAVENOUS

## 2023-11-23 MED ORDER — FENTANYL CITRATE (PF) 100 MCG/2ML IJ SOLN
25.0000 ug | INTRAMUSCULAR | Status: DC | PRN
  Administered 2023-11-23 (×4): 25 ug via INTRAVENOUS

## 2023-11-23 MED ORDER — FENTANYL CITRATE (PF) 250 MCG/5ML IJ SOLN
INTRAMUSCULAR | Status: DC | PRN
  Administered 2023-11-23 (×2): 100 ug via INTRAVENOUS

## 2023-11-23 MED ORDER — OXYCODONE HCL 5 MG PO TABS: 5.0000 mg | ORAL_TABLET | Freq: Once | ORAL | Status: DC | PRN

## 2023-11-23 MED ORDER — LACTATED RINGERS IV SOLN: INTRAVENOUS | Status: DC

## 2023-11-23 MED ORDER — FENTANYL CITRATE (PF) 250 MCG/5ML IJ SOLN
INTRAMUSCULAR | Status: AC
  Filled 2023-11-23: qty 5

## 2023-11-23 MED ORDER — DROPERIDOL 2.5 MG/ML IJ SOLN: 0.6250 mg | Freq: Once | INTRAMUSCULAR | Status: DC | PRN

## 2023-11-23 MED ORDER — ONDANSETRON HCL 4 MG/2ML IJ SOLN
INTRAMUSCULAR | Status: DC | PRN
  Administered 2023-11-23: 4 mg via INTRAVENOUS

## 2023-11-23 MED ORDER — ONDANSETRON HCL 4 MG/2ML IJ SOLN
INTRAMUSCULAR | Status: AC
  Filled 2023-11-23: qty 2

## 2023-11-23 MED ORDER — PROPOFOL 10 MG/ML IV BOLUS
INTRAVENOUS | Status: AC
  Filled 2023-11-23: qty 20

## 2023-11-23 MED ORDER — PROPOFOL 10 MG/ML IV BOLUS
INTRAVENOUS | Status: DC | PRN
  Administered 2023-11-23: 150 mg via INTRAVENOUS

## 2023-11-23 MED ORDER — BUPIVACAINE-EPINEPHRINE 0.25% -1:200000 IJ SOLN
INTRAMUSCULAR | Status: DC | PRN
  Administered 2023-11-23: 30 mL

## 2023-11-23 MED ORDER — EPHEDRINE SULFATE-NACL 50-0.9 MG/10ML-% IV SOSY
PREFILLED_SYRINGE | INTRAVENOUS | Status: DC | PRN
  Administered 2023-11-23: 10 mg via INTRAVENOUS

## 2023-11-23 MED ORDER — ROCURONIUM BROMIDE 10 MG/ML (PF) SYRINGE
PREFILLED_SYRINGE | INTRAVENOUS | Status: DC | PRN
  Administered 2023-11-23: 60 mg via INTRAVENOUS
  Administered 2023-11-23: 20 mg via INTRAVENOUS

## 2023-11-23 MED ORDER — ROCURONIUM BROMIDE 10 MG/ML (PF) SYRINGE
PREFILLED_SYRINGE | INTRAVENOUS | Status: AC
  Filled 2023-11-23: qty 10

## 2023-11-23 MED ORDER — FENTANYL CITRATE (PF) 100 MCG/2ML IJ SOLN
INTRAMUSCULAR | Status: AC
  Filled 2023-11-23: qty 2

## 2023-11-23 MED ORDER — EPHEDRINE 5 MG/ML INJ
INTRAVENOUS | Status: AC
Start: 2023-11-23 — End: 2023-11-23
  Filled 2023-11-23: qty 5

## 2023-11-23 MED ORDER — DEXAMETHASONE SODIUM PHOSPHATE 10 MG/ML IJ SOLN
INTRAMUSCULAR | Status: AC
  Filled 2023-11-23: qty 1

## 2023-11-23 MED ORDER — ORAL CARE MOUTH RINSE: 15.0000 mL | Freq: Once | OROMUCOSAL | Status: AC

## 2023-11-23 MED ORDER — BUPIVACAINE-EPINEPHRINE (PF) 0.25% -1:200000 IJ SOLN
INTRAMUSCULAR | Status: AC
  Filled 2023-11-23: qty 30

## 2023-11-23 MED ORDER — ACETAMINOPHEN 10 MG/ML IV SOLN: 1000.0000 mg | Freq: Once | INTRAVENOUS | Status: DC | PRN

## 2023-11-23 MED ORDER — CHLORHEXIDINE GLUCONATE 0.12 % MT SOLN
15.0000 mL | Freq: Once | OROMUCOSAL | Status: AC
  Administered 2023-11-23: 15 mL via OROMUCOSAL

## 2023-11-23 MED ORDER — LIDOCAINE 2% (20 MG/ML) 5 ML SYRINGE
INTRAMUSCULAR | Status: AC
  Filled 2023-11-23: qty 5

## 2023-11-23 MED ORDER — LIDOCAINE 2% (20 MG/ML) 5 ML SYRINGE
INTRAMUSCULAR | Status: DC | PRN
  Administered 2023-11-23: 100 mg via INTRAVENOUS

## 2023-11-23 MED ORDER — SODIUM CHLORIDE 0.9 % IR SOLN
Status: DC | PRN
  Administered 2023-11-23: 200 mL

## 2023-11-23 MED ORDER — SUGAMMADEX SODIUM 200 MG/2ML IV SOLN
INTRAVENOUS | Status: DC | PRN
  Administered 2023-11-23: 50 mg via INTRAVENOUS
  Administered 2023-11-23: 200 mg via INTRAVENOUS

## 2023-11-23 MED ORDER — OXYCODONE HCL 5 MG/5ML PO SOLN: 5.0000 mg | Freq: Once | ORAL | Status: DC | PRN

## 2023-11-23 MED ORDER — DEXAMETHASONE SODIUM PHOSPHATE 10 MG/ML IJ SOLN
INTRAMUSCULAR | Status: DC | PRN
  Administered 2023-11-23: 5 mg via INTRAVENOUS

## 2023-11-23 SURGICAL SUPPLY — 46 items
BAG COUNTER SPONGE SURGICOUNT (BAG) ×1 IMPLANT
CANISTER SUCTION 3000ML PPV (SUCTIONS) ×1 IMPLANT
CHLORAPREP W/TINT 26 (MISCELLANEOUS) ×1 IMPLANT
CLIP APPLIE ROT 10 11.4 M/L (STAPLE) IMPLANT
CLIP LIGATING HEMO O LOK GREEN (MISCELLANEOUS) ×1 IMPLANT
CNTNR URN SCR LID CUP LEK RST (MISCELLANEOUS) ×1 IMPLANT
COVER MAYO STAND STRL (DRAPES) IMPLANT
COVER SURGICAL LIGHT HANDLE (MISCELLANEOUS) ×1 IMPLANT
DERMABOND ADVANCED .7 DNX12 (GAUZE/BANDAGES/DRESSINGS) ×1 IMPLANT
DRAPE C-ARM 42X120 X-RAY (DRAPES) IMPLANT
ELECTRODE REM PT RTRN 9FT ADLT (ELECTROSURGICAL) ×1 IMPLANT
GAUZE 4X4 16PLY ~~LOC~~+RFID DBL (SPONGE) ×1 IMPLANT
GLOVE BIOGEL PI MICRO STRL 6 (GLOVE) ×1 IMPLANT
GLOVE INDICATOR 6.5 STRL GRN (GLOVE) ×1 IMPLANT
GOWN STRL REUS W/ TWL LRG LVL3 (GOWN DISPOSABLE) ×1 IMPLANT
GRASPER SUT TROCAR 14GX15 (MISCELLANEOUS) ×1 IMPLANT
IRRIGATION SUCT STRKRFLW 2 WTP (MISCELLANEOUS) ×1 IMPLANT
KIT BASIN OR (CUSTOM PROCEDURE TRAY) ×1 IMPLANT
KIT IMAGING PINPOINTPAQ (MISCELLANEOUS) IMPLANT
KIT TURNOVER KIT B (KITS) ×1 IMPLANT
LHOOK LAP DISP 36CM (ELECTROSURGICAL) ×1 IMPLANT
NDL 22X1.5 STRL (OR ONLY) (MISCELLANEOUS) ×1 IMPLANT
NDL INSUFFLATION 14GA 120MM (NEEDLE) ×1 IMPLANT
NEEDLE 22X1.5 STRL (OR ONLY) (MISCELLANEOUS) ×1 IMPLANT
NEEDLE INSUFFLATION 14GA 120MM (NEEDLE) ×1 IMPLANT
NS IRRIG 1000ML POUR BTL (IV SOLUTION) ×1 IMPLANT
PAD ARMBOARD POSITIONER FOAM (MISCELLANEOUS) ×1 IMPLANT
PENCIL BUTTON HOLSTER BLD 10FT (ELECTRODE) ×1 IMPLANT
POUCH LAPAROSCOPIC INSTRUMENT (MISCELLANEOUS) ×1 IMPLANT
POWDER SURGICEL 3.0 GRAM (HEMOSTASIS) IMPLANT
SCISSORS LAP 5X35 DISP (ENDOMECHANICALS) ×1 IMPLANT
SET CHOLANGIOGRAPH 5 50 .035 (SET/KITS/TRAYS/PACK) IMPLANT
SET TUBE SMOKE EVAC HIGH FLOW (TUBING) ×1 IMPLANT
SLEEVE Z-THREAD 5X100MM (TROCAR) ×2 IMPLANT
SPONGE T-LAP 18X18 ~~LOC~~+RFID (SPONGE) ×1 IMPLANT
SUT MNCRL AB 4-0 PS2 18 (SUTURE) ×1 IMPLANT
SUT VICRYL 0 UR6 27IN ABS (SUTURE) IMPLANT
SYSTEM BAG RETRIEVAL 10MM (BASKET) ×1 IMPLANT
TIP ENDOSCOPIC SURGICEL (TIP) IMPLANT
TOWEL GREEN STERILE (TOWEL DISPOSABLE) ×1 IMPLANT
TOWEL GREEN STERILE FF (TOWEL DISPOSABLE) ×1 IMPLANT
TRAY LAPAROSCOPIC MC (CUSTOM PROCEDURE TRAY) ×1 IMPLANT
TROCAR Z THREAD OPTICAL 12X100 (TROCAR) ×1 IMPLANT
TROCAR Z-THREAD OPTICAL 5X100M (TROCAR) ×1 IMPLANT
WARMER LAPAROSCOPE (MISCELLANEOUS) ×1 IMPLANT
WATER STERILE IRR 1000ML POUR (IV SOLUTION) ×1 IMPLANT

## 2023-11-23 NOTE — Op Note (Signed)
 11/23/2023 11:59 AM  PATIENT: Lindsey [name]  68 y.o. female  Patient Care Team: Rolinda Millman, MD as PCP - General (Family Medicine) Sheena Pugh, DO as PCP - Cardiology (Cardiology) Odean Potts, MD as Consulting Physician (Hematology and Oncology)  PRE-OPERATIVE DIAGNOSIS: cholelithiasis with acute cholecystitis  POST-OPERATIVE DIAGNOSIS: same  PROCEDURE: laparoscopic cholecystectomy with ICG  SURGEON: Orie Silversmith, MD  ASSISTANT: None  ANESTHESIA: General endotracheal  EBL: 20cc  DRAINS: None  SPECIMEN: Gallbladder  COUNTS: Sponge, needle and instrument counts were reported correct x2 at the conclusion of the operation  DISPOSITION: PACU in satisfactory condition  COMPLICATIONS: None  FINDINGS: Significantly distended and inflamed gallbladder. ICG showed fluorescence of cystic duct and CBD. Cystic artery and duct clearly identified with critical view prior to clipping.  DESCRIPTION:  Preoperative indocyanine green  was administered in preoperative holding. The patient was identified & brought into the operating room. She was then positioned supine on the OR table. SCDs were in place and active during the entire case. She then underwent general endotracheal anesthesia. Pressure points were padded. Hair on the abdomen was clipped by the OR team. The abdomen was prepped and draped in the standard sterile fashion. Antibiotics were administered. A surgical timeout was performed and confirmed our plan.  A small incision was made in the LUQ at Palmer's point and a veress needle was inserted. Air was aspirated and subsequent positive drop test. Abdomen then insufflated to . A supraumbilical incision was then made and a 5mm trocar optiview using a 30 degree scope was inserted and the abdomen was entered under direct visualization. Inspection confirmed no evidence of trocar or veress site complications. The veress was then removed.    The patient was then positioned in reverse  Trendelenburg with slight left side down. A 12 mm supxiphoid trocar was placed under direct visualization and  two additional 5mm trocars were placed along the right subcostal line - one 5mm port in mid subcostal region, another 5mm port in the right flank near the anterior axillary line.  The liver and gallbladder were inspected. The gallbladder was significantly inflamed with omental adhesions. These adhesions were taken down carefully with electrocautery. The gallbladder fundus was grasped and elevated cephalad. An additional grasper was then placed on the infundibulum of the gallbladder and the infundibulum was retracted laterally. Staying high on the gallbladder, the peritoneum on both sides of the gallbladder was opened with hook cautery. Gentle blunt dissection was then employed with a [place]  dissector working down into [employer]. The cystic duct was identified and carefully circumferentially dissected. The cystic artery was also identified and carefully circumferentially dissected. The cystic artery was impeding skeletonization of the cystic duct so decision was made to clip this high on the gallbladder. Two hemolock clips placed on patient side, one on specimen side.The hepatocystic plate was developed such that a good view of the liver could be seen through a window medial to cystic duct The triangle of [place]ot had been cleared of all fibrofatty tissue. The only structures visualized was the skeletonized cystic duct laterally, and the liver through the window medial to the artery. No posterior cystic artery was noted  Attention turned to infrared fluorescent cholangiography with indocyanine green  which was visualized within the common hepatic duct, common bile duct, cystic duct and small bowel.  The cystic duct was clipped with 2 hemolock clips on the patient side and 1 clip on the specimen side. The cystic duct was then divided. The gallbladder was then freed from its  remaining attachments to  the liver using electrocautery and placed into an endocatch bag. The RUQ was gently irrigated with sterile saline. Given extent of oozing during case from inflammation, decision made to place surgicel powder into the gallbladder fossa for additional hemostasis. No obvious bleeding The clips were in good position; the gallbladder fossa was dry. The rest of the abdomen was inspected no injury nor bleeding elsewhere was identified.  The endocatch bag containing the gallbladder was then removed from the subxiphoid port site and passed off as specimen. The subxiphoid port fascia was then closed in a figured of eight fashion with 0 vicryl using a suture passer. The RUQ ports were removed under direct visualization and noted to be hemostatic.SABRA The fascia was palpated and noted to be completely closed. The abdomen was then desufflated and the periumbilical trocar removed. The skin of all incision sites was approximated with 4-0 monocryl subcuticular suture and dermabond applied. She was then awakened from anesthesia, extubated, and transferred to a stretcher for transport to PACU in satisfactory condition.  Instrument, sponge, and needle counts were correct at closure and at the conclusion of the case.   Orie Silversmith, MD [facility]

## 2023-11-23 NOTE — Progress Notes (Signed)
 Progress Note     Interval: Pain improved, Tbili downtrending. WBC normalized.  Objective: Vital signs in last 24 hours: Temp:  [97.7 F (36.5 C)-98.4 F (36.9 C)] 98.4 F (36.9 C) (09/03 0602) Pulse Rate:  [64-90] 64 (09/03 0602) Resp:  [15-20] 20 (09/02 1853) BP: (91-130)/(46-82) 110/57 (09/03 0602) SpO2:  [91 %-100 %] 98 % (09/03 0602) Weight:  [107 kg] 107 kg (09/02 2247)    Intake/Output from previous day: 09/02 0701 - 09/03 0700 In: 2311.5 [I.V.:1111.6; IV Piggyback:1199.9] Out: 300 [Emesis/NG output:300] Intake/Output this shift: Total I/O In: 2311.5 [I.V.:1111.6; IV Piggyback:1199.9] Out: 300 [Emesis/NG output:300]  PE: General: NAD, feeling much better today HEENT: head -normocephalic, atraumatic Neck- Trachea is midline CV- RRR Pulm- breathing is non-labored ORA Abd- soft, nontender GU- deferred  MSK- UE/LE symmetrical, no cyanosis, clubbing, or edema. Neuro- No focal neurologic deficits Psych- Alert and Oriented x3 with appropriate affect Skin: warm and dry, no rashes or lesions   Lab Results:  Recent Labs    11/22/23 1149 11/22/23 1157 11/23/23 0506  WBC 12.3*  --  10.2  HGB 12.5 13.3 11.7*  HCT 38.4 39.0 36.2  PLT 303  --  298   BMET Recent Labs    11/22/23 1149 11/22/23 1157 11/23/23 0506  NA 139 141 139  K 3.6 3.7 3.9  CL 104 104 104  CO2 23  --  26  GLUCOSE 142* 142* 94  BUN 16 18 14   CREATININE 0.70 0.70 0.62  CALCIUM 9.2  --  9.0   PT/INR No results for input(s): LABPROT, INR in the last 72 hours. CMP     Component Value Date/Time   NA 139 11/23/2023 0506   NA 144 02/22/2022 1212   K 3.9 11/23/2023 0506   CL 104 11/23/2023 0506   CO2 26 11/23/2023 0506   GLUCOSE 94 11/23/2023 0506   BUN 14 11/23/2023 0506   BUN 19 02/22/2022 1212   CREATININE 0.62 11/23/2023 0506   CREATININE 0.63 05/03/2022 1018   CALCIUM 9.0 11/23/2023 0506   PROT 6.3 (L) 11/23/2023 0506   PROT 6.9 08/20/2020 1325   ALBUMIN 3.3 (L)  11/23/2023 0506   ALBUMIN 4.5 08/20/2020 1325   AST 21 11/23/2023 0506   AST 18 05/03/2022 1018   ALT 15 11/23/2023 0506   ALT 15 05/03/2022 1018   ALKPHOS 57 11/23/2023 0506   BILITOT 1.5 (H) 11/23/2023 0506   BILITOT 1.1 05/03/2022 1018   GFRNONAA >60 11/23/2023 0506   GFRNONAA >60 05/03/2022 1018   GFRAA 114 03/26/2019 1140   Lipase     Component Value Date/Time   LIPASE 30 11/23/2023 0506       Studies/Results: CT ABDOMEN PELVIS W CONTRAST Result Date: 11/22/2023 CLINICAL DATA:  Epigastric pain EXAM: CT ABDOMEN AND PELVIS WITH CONTRAST TECHNIQUE: Multidetector CT imaging of the abdomen and pelvis was performed using the standard protocol following bolus administration of intravenous contrast. RADIATION DOSE REDUCTION: This exam was performed according to the departmental dose-optimization program which includes automated exposure control, adjustment of the mA and/or kV according to patient size and/or use of iterative reconstruction technique. CONTRAST:  75mL OMNIPAQUE  IOHEXOL  350 MG/ML SOLN COMPARISON:  None Available. FINDINGS: Lower chest: No significant abnormality Hepatobiliary: No liver lesions. No intrahepatic biliary dilatation. Gallbladder: Single large gallstone noted without evidence of cholecystitis Pancreas: Normal Spleen: Normal Adrenal glands: Normal. Kidneys: Normal Stomach/duodenum: Normal Small & large bowel: There is sigmoid diverticulosis with evidence of diverticulitis. No abscess. Vascular/Lymphatic: No significant  vascular findings are present. No enlarged abdominal or pelvic lymph nodes. Pelvis: The bladder appears normal. The uterus is normal. No adnexal mass Other: None Musculoskeletal: No bone lesion, fracture or other significant abnormality IMPRESSION: 1. There is a single large gallstone without evidence of acute cholecystitis. No evidence of biliary obstruction 2. Extensive sigmoid diverticulosis with evidence of diverticulitis without abscess. Electronically  Signed   By: Nancyann Burns M.D.   On: 11/22/2023 14:59   US  Abdomen Limited RUQ (LIVER/GB) Result Date: 11/22/2023 CLINICAL DATA:  Abdominal pain EXAM: ULTRASOUND ABDOMEN LIMITED RIGHT UPPER QUADRANT COMPARISON:  CT abdomen pelvis 06/15/2023 FINDINGS: Gallbladder: Gallstones: Mild cholelithiasis Sludge: None Gallbladder Wall: Within normal limits Pericholecystic fluid: None Sonographic Murphy's Sign: Negative per technologist Common bile duct: Diameter: 7 mm Liver: Parenchymal echogenicity: Within normal limits Contours: Normal Lesions: None Portal vein: Patent.  Hepatopetal flow Other: None. IMPRESSION: Mild cholelithiasis without additional sonographic evidence of acute cholecystitis. Electronically Signed   By: Aliene Lloyd M.D.   On: 11/22/2023 13:49     Assessment/Plan 68 yo F with biliary colic, suspect acute calculous cholecystitis   LOS: 1 day   - Proceed to OR today for laparoscopic cholecystectomy with ICG, possible IOC - We discussed the etiology of patient's pain, we discussed treatment options and recommended surgery. We discussed details of surgery including general anesthesia, laparoscopic approach, identification of cystic duct and common bile duct. Ligation of cystic duct and cystic artery. Possible need for intraoperative cholangiogram, open procedure, and subtotal cholecystectomy. Possible risks of common bile duct injury, injury to surrounding structures, bile leak, bleeding, infection, diarrhea, retained stone and hernia. The patient showed good understanding and all questions were answered   I reviewed nursing notes, hospitalist notes, last 24 h vitals and pain scores, last 48 h intake and output, last 24 h labs and trends, and last 24 h imaging results.  This care required high  level of medical decision making.    Richerd Silversmith, MD [facility] 11/23/2023, 6:57 AM Please see Amion for pager number during day hours 7:00am-4:30pm

## 2023-11-23 NOTE — Hospital Course (Signed)
 Lindsey [name] is a 68 y.o. 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 Reports that things started around 930 AM. She had made breakfast but didn't eat it. She was constipated and had cramping around her LLQ / RLQ, but things suddently moved to epigastrium. And then now radiating more towards the RUQ. She has associated nausea, intolerance of water since onset. No fevers, chills. Denies hx of biliary colic symptoms previously. She did have episode 6 MO ago of severe abd pain which never recurred.  General surgery was consulted. Underwent lap chole on 09/3. Assessment and Plan: Acute calculous cholecystitis P/w acute onset epigastric pain. T Bili 1.9, other LFT wnl. WBC 12.  CT with large gallstone, no biliary dilation or CT evidence of cholecystitis; however suspected clinically based on her presentation per surgery.  General surgery was consulted. Recommended lap chole. Patient underwent lap chole with ICG on 9/3. Postsurgery patient has not had any significant oral intake and remains drowsy with severe pain. Monitor overnight.   Borderline hypotension  Treated with IV fluid. Blood pressure stable.   Obesity class II:  Following with weight loss clinic OP   PreDM:  Hold home metformin  (taking moreso for appetite suppressant). SSI for very sensitive, A1c 5.3 only.  DCIS L breast:  On active surveillance

## 2023-11-23 NOTE — Progress Notes (Signed)
 Pt requested help to fill out advanced directives before her procedure this morning. I paged Chaplain on call. Chaplain called back saying all the needed personnel to make it happen would not be available before she goes to procedure but can be planned later in the day.

## 2023-11-23 NOTE — Progress Notes (Signed)
 Lunch tray ordered for pt.

## 2023-11-23 NOTE — Transfer of Care (Signed)
 Immediate Anesthesia Transfer of Care Note  Patient: Lindsey [name]  Procedure(s) Performed: lap chole with ICG, possible IOC (Abdomen)  Patient Location: PACU  Anesthesia Type:General  Level of Consciousness: awake, alert , and oriented  Airway & Oxygen Therapy: Patient Spontanous Breathing and Patient connected to face mask oxygen  Post-op Assessment: Report given to RN and Post -op Vital signs reviewed and stable  Post vital signs: Reviewed and stable  Last Vitals:  Vitals Value Taken Time  BP 123/58 11/23/23 12:04  Temp    Pulse 84 11/23/23 12:06  Resp 19 11/23/23 12:06  SpO2 93 % 11/23/23 12:06  Vitals shown include unfiled device data.  Last Pain:  Vitals:   11/23/23 0931  TempSrc:   PainSc: 1          Complications: No notable events documented.

## 2023-11-23 NOTE — Progress Notes (Signed)
 Pt transported to short stay via bed by transportation staff. All undergarment removed prior to transfer

## 2023-11-23 NOTE — Anesthesia Procedure Notes (Addendum)
 Procedure Name: Intubation Date/Time: 11/23/2023 10:21 AM  Performed by: Delores Dus, CRNAPre-anesthesia Checklist: Patient identified, Emergency Drugs available, Suction available and Patient being monitored Patient Re-evaluated:Patient Re-evaluated prior to induction Oxygen Delivery Method: Circle system utilized Preoxygenation: Pre-oxygenation with 100% oxygen Induction Type: IV induction Ventilation: Mask ventilation without difficulty Laryngoscope Size: Miller and 2 Grade View: Grade I Tube type: Oral Tube size: 7.0 mm Number of attempts: 1 Airway Equipment and Method: Stylet and Oral airway Placement Confirmation: ETT inserted through vocal cords under direct vision, positive ETCO2 and breath sounds checked- equal and bilateral Secured at: 22 cm Tube secured with: Tape Dental Injury: Teeth and Oropharynx as per pre-operative assessment

## 2023-11-23 NOTE — Plan of Care (Signed)

## 2023-11-23 NOTE — Progress Notes (Signed)
 Pt arrived back to 6 north room 20 alert and oriented x4. Pain level5/10 in abdomen from surgery. 4 port sites with skin glue all clean dry and intact. Bed in lowest postion. Call light in reach. All needs met at this time

## 2023-11-23 NOTE — Care Management Obs Status (Signed)
 MEDICARE OBSERVATION STATUS NOTIFICATION   Patient Details  Name: Lindsey [name] MRN: [id number] Date of Birth: 1955/03/24   Medicare Observation Status Notification Given:  Yes    Stephane Powell Jansky, RN 11/23/2023, 3:15 PM

## 2023-11-23 NOTE — Progress Notes (Signed)
 [facility] Progress Note Patient: Lindsey [name] FMW:[id number] DOB: 05/27/1955  DOA: 11/22/2023 DOS: the patient was seen and examined on 11/23/2023  Brief Hospital Course: Lindsey [name] is a 68 y.o. 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 Reports that things started around 930 AM. She had made breakfast but didn't eat it. She was constipated and had cramping around her LLQ / RLQ, but things suddently moved to epigastrium. And then now radiating more towards the RUQ. She has associated nausea, intolerance of water since onset. No fevers, chills. Denies hx of biliary colic symptoms previously. She did have episode 6 MO ago of severe abd pain which never recurred.  General surgery was consulted. Underwent lap chole on 09/3. Assessment and Plan: Acute calculous cholecystitis P/w acute onset epigastric pain. T Bili 1.9, other LFT wnl. WBC 12.  CT with large gallstone, no biliary dilation or CT evidence of cholecystitis; however suspected clinically based on her presentation per surgery.  General surgery was consulted. Recommended lap chole. Patient underwent lap chole with ICG on 9/3. Postsurgery patient has not had any significant oral intake and remains drowsy with severe pain. Monitor overnight.   Borderline hypotension  Treated with IV fluid. Blood pressure stable.   Obesity class II:  Following with weight loss clinic OP   PreDM:  Hold home metformin  (taking moreso for appetite suppressant). SSI for very sensitive, A1c 5.3 only.  DCIS L breast:  On active surveillance   Subjective: Ongoing abdominal pain.  No nausea no vomiting no fever no chills.  Feels groggy.  No oral intake so far.  Physical Exam: Clear to auscultation. S1-S2 present. Bowel sounds present.  Diffusely tender. No edema.  Data Reviewed: I have Reviewed nursing notes, Vitals, and Lab results. Since last  encounter, pertinent lab results CBC and BMP   . I have ordered test including CBC and BMP  .  Discussed with general surgery Disposition: Status is: Observation  SCDs Start: 11/22/23 1947 Family Communication: No one at bedside Level of care: Med-Surg   Vitals:   11/23/23 1330 11/23/23 1345 11/23/23 1400 11/23/23 1502  BP: (!) 108/55 (!) 101/56 101/60 96/70  Pulse: 67 67 64 72  Resp: 16 17 14 17   Temp:   97.8 F (36.6 C) 98.4 F (36.9 C)  TempSrc:    Oral  SpO2: 95% 95% 96% 94%  Weight:      Height:         Author: Yetta Blanch, MD 11/23/2023 4:34 PM  Please look on [email or web address] to find out who is on call.

## 2023-11-23 NOTE — Discharge Instructions (Addendum)

## 2023-11-23 NOTE — Anesthesia Postprocedure Evaluation (Signed)
 Anesthesia Post Note  Patient: Lindsey [name]  Procedure(s) Performed: lap chole with ICG (Abdomen)     Patient location during evaluation: PACU Anesthesia Type: General Level of consciousness: awake and alert Pain management: pain level controlled Vital Signs Assessment: post-procedure vital signs reviewed and stable Respiratory status: spontaneous breathing, nonlabored ventilation, respiratory function stable and patient connected to nasal cannula oxygen Cardiovascular status: blood pressure returned to baseline and stable Postop Assessment: no apparent nausea or vomiting Anesthetic complications: no   No notable events documented.  Last Vitals:  Vitals:   11/23/23 1640 11/23/23 2038  BP: (!) 136/95 (!) 110/50  Pulse: (!) 106 62  Resp: 18 18  Temp: 37.1 C 36.8 C  SpO2: 94% 96%    Last Pain:  Vitals:   11/23/23 2137  TempSrc:   PainSc: 6                  Thom JONELLE Peoples

## 2023-11-23 NOTE — Progress Notes (Signed)
 Report called to short stay. Nurse verbalized understanding of report. Consent signed and in chart. Pre procedure flowsheet complete. CHG wipes completed.

## 2023-11-23 NOTE — Care Management CC44 (Signed)
 Condition Code 44 Documentation Completed  Patient Details  Name: Lindsey [name] MRN: [id number] Date of Birth: [birth year]   Condition Code 44 given:  Yes Patient signature on Condition Code 44 notice:  Yes Documentation of 2 MD's agreement:  Yes Code 44 added to claim:  Yes    Stephane Powell Jansky, RN 11/23/2023, 3:15 PM

## 2023-11-24 ENCOUNTER — Encounter (HOSPITAL_COMMUNITY): Payer: Self-pay | Admitting: General Surgery

## 2023-11-24 ENCOUNTER — Other Ambulatory Visit (HOSPITAL_COMMUNITY): Payer: Self-pay

## 2023-11-24 DIAGNOSIS — K810 Acute cholecystitis: Secondary | ICD-10-CM | POA: Diagnosis not present

## 2023-11-24 LAB — GLUCOSE, CAPILLARY: Glucose-Capillary: 133 mg/dL — ABNORMAL HIGH (ref 70–99)

## 2023-11-24 LAB — SURGICAL PATHOLOGY

## 2023-11-24 MED ORDER — POLYETHYLENE GLYCOL 3350 17 GM/SCOOP PO POWD
17.0000 g | Freq: Every day | ORAL | 0 refills | Status: DC | PRN
  Filled 2023-11-24: qty 238, 14d supply, fill #0

## 2023-11-24 MED ORDER — OXYCODONE HCL 5 MG PO TABS
5.0000 mg | ORAL_TABLET | ORAL | 0 refills | Status: DC | PRN
  Filled 2023-11-24: qty 15, 3d supply, fill #0

## 2023-11-24 MED ORDER — ACETAMINOPHEN 500 MG PO TABS: 1000.0000 mg | ORAL_TABLET | Freq: Four times a day (QID) | ORAL | Status: DC | PRN

## 2023-11-24 MED ORDER — ONDANSETRON 4 MG PO TBDP
4.0000 mg | ORAL_TABLET | Freq: Three times a day (TID) | ORAL | 0 refills | Status: DC | PRN
  Filled 2023-11-24: qty 14, 5d supply, fill #0

## 2023-11-24 NOTE — Progress Notes (Signed)
 1 Day Post-Op  Subjective: Up ambulating in the room, getting ready for a shower.  Pain well controlled. Tolerating a diet  Objective: Vital signs in last 24 hours: Temp:  [97.8 F (36.6 C)-98.7 F (37.1 C)] 97.9 F (36.6 C) (09/04 0521) Pulse Rate:  [61-106] 61 (09/04 0521) Resp:  [13-18] 17 (09/04 0521) BP: (96-136)/(50-95) 111/61 (09/04 0521) SpO2:  [91 %-100 %] 100 % (09/04 0521) Last BM Date : 11/22/23  Intake/Output from previous day: 09/03 0701 - 09/04 0700 In: 2008.3 [P.O.:880; I.V.:1128.3] Out: 15 [Blood:15] Intake/Output this shift: Total I/O In: 3 [I.V.:3] Out: -   PE: Abd: soft, appropriately tender, incisions c/d/I with ecchymosis present around several of the incisions, not unexpected.    Lab Results:  Recent Labs    11/22/23 1149 11/22/23 1157 11/23/23 0506  WBC 12.3*  --  10.2  HGB 12.5 13.3 11.7*  HCT 38.4 39.0 36.2  PLT 303  --  298   BMET Recent Labs    11/22/23 1149 11/22/23 1157 11/23/23 0506  NA 139 141 139  K 3.6 3.7 3.9  CL 104 104 104  CO2 23  --  26  GLUCOSE 142* 142* 94  BUN 16 18 14   CREATININE 0.70 0.70 0.62  CALCIUM 9.2  --  9.0   PT/INR No results for input(s): LABPROT, INR in the last 72 hours. CMP     Component Value Date/Time   NA 139 11/23/2023 0506   NA 144 02/22/2022 1212   K 3.9 11/23/2023 0506   CL 104 11/23/2023 0506   CO2 26 11/23/2023 0506   GLUCOSE 94 11/23/2023 0506   BUN 14 11/23/2023 0506   BUN 19 02/22/2022 1212   CREATININE 0.62 11/23/2023 0506   CREATININE 0.63 05/03/2022 1018   CALCIUM 9.0 11/23/2023 0506   PROT 6.3 (L) 11/23/2023 0506   PROT 6.9 08/20/2020 1325   ALBUMIN 3.3 (L) 11/23/2023 0506   ALBUMIN 4.5 08/20/2020 1325   AST 21 11/23/2023 0506   AST 18 05/03/2022 1018   ALT 15 11/23/2023 0506   ALT 15 05/03/2022 1018   ALKPHOS 57 11/23/2023 0506   BILITOT 1.5 (H) 11/23/2023 0506   BILITOT 1.1 05/03/2022 1018   GFRNONAA >60 11/23/2023 0506   GFRNONAA >60 05/03/2022 1018    GFRAA 114 03/26/2019 1140   Lipase     Component Value Date/Time   LIPASE 30 11/23/2023 0506       Studies/Results: CT ABDOMEN PELVIS W CONTRAST Result Date: 11/22/2023 CLINICAL DATA:  Epigastric pain EXAM: CT ABDOMEN AND PELVIS WITH CONTRAST TECHNIQUE: Multidetector CT imaging of the abdomen and pelvis was performed using the standard protocol following bolus administration of intravenous contrast. RADIATION DOSE REDUCTION: This exam was performed according to the departmental dose-optimization program which includes automated exposure control, adjustment of the mA and/or kV according to patient size and/or use of iterative reconstruction technique. CONTRAST:  75mL OMNIPAQUE  IOHEXOL  350 MG/ML SOLN COMPARISON:  None Available. FINDINGS: Lower chest: No significant abnormality Hepatobiliary: No liver lesions. No intrahepatic biliary dilatation. Gallbladder: Single large gallstone noted without evidence of cholecystitis Pancreas: Normal Spleen: Normal Adrenal glands: Normal. Kidneys: Normal Stomach/duodenum: Normal Small & large bowel: There is sigmoid diverticulosis with evidence of diverticulitis. No abscess. Vascular/Lymphatic: No significant vascular findings are present. No enlarged abdominal or pelvic lymph nodes. Pelvis: The bladder appears normal. The uterus is normal. No adnexal mass Other: None Musculoskeletal: No bone lesion, fracture or other significant abnormality IMPRESSION: 1. There is a  single large gallstone without evidence of acute cholecystitis. No evidence of biliary obstruction 2. Extensive sigmoid diverticulosis with evidence of diverticulitis without abscess. Electronically Signed   By: Nancyann Burns M.D.   On: 11/22/2023 14:59   US  Abdomen Limited RUQ (LIVER/GB) Result Date: 11/22/2023 CLINICAL DATA:  Abdominal pain EXAM: ULTRASOUND ABDOMEN LIMITED RIGHT UPPER QUADRANT COMPARISON:  CT abdomen pelvis 06/15/2023 FINDINGS: Gallbladder: Gallstones: Mild cholelithiasis Sludge: None  Gallbladder Wall: Within normal limits Pericholecystic fluid: None Sonographic Murphy's Sign: Negative per technologist Common bile duct: Diameter: 7 mm Liver: Parenchymal echogenicity: Within normal limits Contours: Normal Lesions: None Portal vein: Patent.  Hepatopetal flow Other: None. IMPRESSION: Mild cholelithiasis without additional sonographic evidence of acute cholecystitis. Electronically Signed   By: Aliene Lloyd M.D.   On: 11/22/2023 13:49    Anti-infectives: Anti-infectives (From admission, onward)    Start     Dose/Rate Route Frequency Ordered Stop   11/23/23 0400  metroNIDAZOLE  (FLAGYL ) IVPB 500 mg  Status:  Discontinued       Placed in And Linked Group   500 mg 100 mL/hr over 60 Minutes Intravenous Every 12 hours 11/22/23 1637 11/23/23 1425   11/23/23 0000  ceFEPIme  (MAXIPIME ) 2 g in sodium chloride  0.9 % 100 mL IVPB  Status:  Discontinued       Placed in And Linked Group   2 g 200 mL/hr over 30 Minutes Intravenous Every 8 hours 11/22/23 1637 11/23/23 1425   11/22/23 1545  ceFEPIme  (MAXIPIME ) 2 g in sodium chloride  0.9 % 100 mL IVPB       Placed in And Linked Group   2 g 200 mL/hr over 30 Minutes Intravenous  Once 11/22/23 1540 11/22/23 1630   11/22/23 1545  metroNIDAZOLE  (FLAGYL ) IVPB 500 mg       Placed in And Linked Group   500 mg 100 mL/hr over 60 Minutes Intravenous  Once 11/22/23 1540 11/22/23 1851        Assessment/Plan POD 1, s/p lap chole by Dr. Butron 9/3 for cholecystitis -doing well.  Tolerating a diet -mobilizing -pain controlled -surgically stable for DC home today.  D/w primary  FEN - regular VTE - scds ID - none needed    LOS: 1 day    Lindsey [name] Banter , [name] 11/24/2023, 9:24 AM Please see Amion for pager number during day hours 7:00am-4:30pm or 7:00am -11:30am on weekends

## 2023-11-24 NOTE — Progress Notes (Signed)
 11/24/23 1200  Spiritual Encounters  Type of Visit Initial  Care provided to: Patient  Referral source Nurse (RN/NT/LPN)  Reason for visit Advance directives  OnCall Visit No   Chaplain responded to consult request for HCPOA. The patient was about the discharge. Chaplain provided the education of AD. Redell received the education and stated that she understood the document. Then, the document was ready for notarization. It was notarized by a notary and two witnesses. Chaplain provided the copies to the patient.  Chaplain provided the Advance Directive packet as well as education on Advance Directives-documents an individual completes to communicate their health care directions in advance of a time when they may need them. Chaplain informed pt the documents which may be completed here in the hospital are the Living Will and Health Care Power of [place].  Chaplain informed that the Health Care Power of [place] is a legal document in which an individual names another person, their Health Care Agent, to make health care decisions when the individual is not able to make them for themselves. The Health Care Agent's function can be temporary or permanent depending on the pt's ability to make and communicate those decisions independently. Chaplain informed pt in the absence of a Health Care Power of Attorney, the state of [place]  directs health care providers to look to the following individuals in the order listed: legal guardian; an attorney?in?fact under a general power of attorney (POA) if that POA includes the right to make health care decisions; a husband or wife; a majority of parents and adult children; a majority of adult brothers and sisters; or an individual who has an established relationship with you, who is acting in good faith and who can convey your wishes.  If none of these person are available or willing to make medical decisions on a patient's behalf, the law allows the patient's  doctor to make decisions for them as long as another doctor agrees with those decisions.  Chaplain also informed the patient that the Health Care agent has no decision-making authority over any affairs other than those related to his or her medical care.  The chaplain further educated the pt that a Living Will is a legal document that allows an individual to state his or her desire not to receive life-prolonging measures in the event that they have a condition that is incurable and will result in their death in a short period of time; they are unconscious, and doctors are confident that they will not regain consciousness; and/or they have advanced dementia or other substantial and irreversible loss of mental function. The chaplain informed pt that life-prolonging measures are medical treatments that would only serve to postpone death, including breathing machines, kidney dialysis, antibiotics, artificial nutrition and hydration (tube feeding), and similar forms of treatment and that if an individual is able to express their wishes, they may also make them known without the use of a Living Will, but in the event that an individual is not able to express their wishes themselves, a Living Will allows medical providers and the pt's family and friends ensure that they are not making decisions on the pt's behalf, but rather serving as the pt's voice to convey decisions the pt has already made.  The patient is aware that the decision to create an advance directive is theirs alone and they may chose not to complete the documents or may chose to complete one portion or both.  The patient was informed that they can revoke  the documents at any time by striking through them and writing void or by completing new documents, but that it is also advisable that the individual verbally notify interested parties that their wishes have changed.  They are also aware that the document must be signed in the presence of a notary  public and two witnesses and that this can be done while the patient is still admitted to the hospital or after discharge in the community. If they decide to complete Advance Directives after being discharged from the hospital, they have been advised to notify all interested parties and to provide those documents to their physicians and loved ones in addition to bringing them to the hospital in the event of another hospitalization.  The chaplain informed the pt that if they desire to proceed with completing Advance Directive Documentation while they are still admitted, notary services are typically available at [facility] between the hours of 1:00 and 3:30 Monday-Thursday.    When the patient is ready to have these documents completed, the patient should request that their nurse place a spiritual care consult and indicate that the patient is ready to have their advance directives notarized so that arrangements for witnesses and notary public can be made.  Please page spiritual care if the patient desires further education or has questions.  M.Kubra Susanna Kerry Resident [phone]

## 2023-11-24 NOTE — Plan of Care (Signed)
  Problem: Pain Managment: Goal: General experience of comfort will improve and/or be controlled Outcome: Progressing   Problem: Safety: Goal: Ability to remain free from injury will improve Outcome: Progressing   Problem: Skin Integrity: Goal: Risk for impaired skin integrity will decrease Outcome: Progressing

## 2023-11-25 NOTE — Discharge Summary (Signed)
 Physician Discharge Summary   Patient: Lindsey [name] MRN: [id number] DOB: [birth year]  Admit date:     11/22/2023  Discharge date: 11/24/2023  Discharge Physician: Yetta Blanch  PCP: Rolinda Millman, MD  Recommendations at discharge: Follow-up with PCP in 1 week. Follow-up with general surgery as recommended.   Follow-up Information     Maczis, Puja Gosai, PA-C Follow up on 12/20/2023.   Specialty: General Surgery Why: at 9:15 AM for post-operative follow up. please arrive 30 minutes early to check in and fill out any necessary paperwork. Contact information: [address] [place] [address] [phone]         Rolinda Millman, MD. Schedule an appointment as soon as possible for a visit in 1 week(s).   Specialty: Family Medicine Contact information: [phone] [address] [place] [address] [phone]                Discharge Diagnoses: Principal Problem:   Acute cholecystitis Active Problems:   Biliary colic  Hospital Course: ZI SEK is a 68 y.o. 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 Reports that things started around 930 AM. She had made breakfast but didn't eat it. She was constipated and had cramping around her LLQ / RLQ, but things suddently moved to epigastrium. And then now radiating more towards the RUQ. She has associated nausea, intolerance of water since onset. No fevers, chills. Denies hx of biliary colic symptoms previously. She did have episode 6 MO ago of severe abd pain which never recurred.  General surgery was consulted. Underwent lap chole on 09/3. Assessment and Plan: Acute calculous cholecystitis P/w acute onset epigastric pain. T Bili 1.9, other LFT wnl. WBC 12.  CT with large gallstone, no biliary dilation or CT evidence of cholecystitis; however suspected clinically based on her presentation per surgery.  General  surgery was consulted. Recommended lap chole. Patient underwent lap chole with ICG on 9/3. Able to tolerate oral diet.  Pain well-controlled.   Borderline hypotension  Treated with IV fluid. Blood pressure stable.   Obesity class II:  Body mass index is 38.07 kg/m.  Following with weight loss clinic OP   PreDM:  Resuming home medication.  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 and patient was instructed, not to drive, operate heavy machinery, perform activities at heights, swimming or participation in water activities or provide baby-sitting services while on Pain, Sleep and Anxiety Medications; until their outpatient Physician has advised to do so again. Also recommended to not to take more than prescribed Pain, Sleep and Anxiety Medications.  Consultants:  General Surgery  Procedures performed:  Lap chole  DISCHARGE MEDICATION: Allergies as of 11/24/2023       Reactions   Lactose Intolerance (gi) Other (See Comments)   Cramping   Sulfa Antibiotics Other (See Comments)   Unknown    Amoxicillin Rash        Medication List     STOP taking these medications    HYDROcodone -acetaminophen  5-325 MG tablet Commonly known as: NORCO/VICODIN       TAKE these medications    acetaminophen  500 MG tablet Commonly known as: TYLENOL  Take 2 tablets (1,000 mg total) by mouth every 6 (six) hours as needed.   fluticasone  50 MCG/ACT nasal spray Commonly known as: FLONASE  1 spray in each nostril  Nasally Once a day   metFORMIN  500 MG 24 hr tablet Commonly known as: GLUCOPHAGE -XR Take 1 tablet (500 mg total) by mouth daily with breakfast.   ondansetron  4 MG disintegrating tablet Commonly known as: ZOFRAN -ODT Dissolve 1 tablet (4 mg total) in mouth every 8 (eight) hours as needed for nausea or vomiting.   oxyCODONE  5 MG immediate release tablet Commonly known as: Roxicodone  Take 1  tablet (5 mg total) by mouth every 4 (four) hours as needed.   polyethylene glycol powder 17 GM/SCOOP powder Commonly known as: GLYCOLAX /MIRALAX  Take 1 capful (17 g) by mouth daily as needed for mild constipation. Dissolve 1 capful (17g) in 4-8 ounces of liquid and take by mouth daily.   SENIOR MULTIVITAMIN PLUS PO Senior Multivitamin Plus   Turmeric Curcumin 500 MG Caps as directed Orally   Vitamin D3 125 MCG (5000 UT) Caps Take 1 capsule by mouth daily.       Disposition: Home Diet recommendation: Carb modified diet  Discharge Exam: Vitals:   11/23/23 1502 11/23/23 1640 11/23/23 2038 11/24/23 0521  BP: 96/70 (!) 136/95 (!) 110/50 111/61  Pulse: 72 (!) 106 62 61  Resp: 17 18 18 17   Temp: 98.4 F (36.9 C) 98.7 F (37.1 C) 98.2 F (36.8 C) 97.9 F (36.6 C)  TempSrc: Oral Oral Oral Oral  SpO2: 94% 94% 96% 100%  Weight:      Height:       Clear to auscultation. S1-S2 present Bowel sounds present.  Mild tenderness diffuse. No edema.  Filed Weights   11/22/23 2247 11/23/23 0919  Weight: 107 kg 107 kg   Condition at discharge: stable  The results of significant diagnostics from this hospitalization (including imaging, microbiology, ancillary and laboratory) are listed below for reference.   Imaging Studies: CT ABDOMEN PELVIS W CONTRAST Result Date: 11/22/2023 CLINICAL DATA:  Epigastric pain EXAM: CT ABDOMEN AND PELVIS WITH CONTRAST TECHNIQUE: Multidetector CT imaging of the abdomen and pelvis was performed using the standard protocol following bolus administration of intravenous contrast. RADIATION DOSE REDUCTION: This exam was performed according to the departmental dose-optimization program which includes automated exposure control, adjustment of the mA and/or kV according to patient size and/or use of iterative reconstruction technique. CONTRAST:  75mL OMNIPAQUE  IOHEXOL  350 MG/ML SOLN COMPARISON:  None Available. FINDINGS: Lower chest: No significant abnormality  Hepatobiliary: No liver lesions. No intrahepatic biliary dilatation. Gallbladder: Single large gallstone noted without evidence of cholecystitis Pancreas: Normal Spleen: Normal Adrenal glands: Normal. Kidneys: Normal Stomach/duodenum: Normal Small & large bowel: There is sigmoid diverticulosis with evidence of diverticulitis. No abscess. Vascular/Lymphatic: No significant vascular findings are present. No enlarged abdominal or pelvic lymph nodes. Pelvis: The bladder appears normal. The uterus is normal. No adnexal mass Other: None Musculoskeletal: No bone lesion, fracture or other significant abnormality IMPRESSION: 1. There is a single large gallstone without evidence of acute cholecystitis. No evidence of biliary obstruction 2. Extensive sigmoid diverticulosis with evidence of diverticulitis without abscess. Electronically Signed   By: Nancyann Burns M.D.   On: 11/22/2023 14:59   US  Abdomen Limited RUQ (LIVER/GB) Result Date: 11/22/2023 CLINICAL DATA:  Abdominal pain EXAM: ULTRASOUND ABDOMEN LIMITED RIGHT UPPER QUADRANT COMPARISON:  CT abdomen pelvis 06/15/2023 FINDINGS: Gallbladder: Gallstones: Mild cholelithiasis Sludge: None Gallbladder Wall: Within normal limits Pericholecystic fluid: None Sonographic Murphy's Sign: Negative per technologist Common bile duct: Diameter: 7 mm Liver: Parenchymal echogenicity: Within normal limits Contours: Normal Lesions: None Portal vein: Patent.  Hepatopetal flow Other: None. IMPRESSION: Mild cholelithiasis without additional  sonographic evidence of acute cholecystitis. Electronically Signed   By: Aliene Lloyd M.D.   On: 11/22/2023 13:49    Microbiology: Results for orders placed or performed during the hospital encounter of 11/22/23  Surgical PCR screen     Status: None   Collection Time: 11/22/23 11:46 PM   Specimen: Nasal Mucosa; Nasal Swab  Result Value Ref Range Status   MRSA, PCR NEGATIVE NEGATIVE Final   Staphylococcus aureus NEGATIVE NEGATIVE Final     Comment: (NOTE) The Xpert SA Assay (FDA approved for NASAL specimens in patients 49 years of age and older), is one component of a comprehensive surveillance program. It is not intended to diagnose infection nor to guide or monitor treatment. Performed at [facility], [address], [place], [place] [address]    Labs: CBC: Recent Labs  Lab 11/22/23 1149 11/22/23 1157 11/23/23 0506  WBC 12.3*  --  10.2  NEUTROABS 10.3*  --   --   HGB 12.5 13.3 11.7*  HCT 38.4 39.0 36.2  MCV 94.6  --  93.3  PLT 303  --  298   Basic Metabolic Panel: Recent Labs  Lab 11/22/23 1149 11/22/23 1157 11/23/23 0506  NA 139 141 139  K 3.6 3.7 3.9  CL 104 104 104  CO2 23  --  26  GLUCOSE 142* 142* 94  BUN 16 18 14   CREATININE 0.70 0.70 0.62  CALCIUM 9.2  --  9.0   Liver Function Tests: Recent Labs  Lab 11/22/23 1149 11/23/23 0506  AST 19 21  ALT 16 15  ALKPHOS 66 57  BILITOT 1.9* 1.5*  PROT 6.9 6.3*  ALBUMIN 3.8 3.3*   CBG: Recent Labs  Lab 11/23/23 0717 11/23/23 1503 11/24/23 0851  GLUCAP 95 113* 133*    Discharge time spent: greater than 30 minutes.  Author: Yetta Blanch, MD  [facility] 11/24/2023

## 2023-12-02 DIAGNOSIS — Z9049 Acquired absence of other specified parts of digestive tract: Secondary | ICD-10-CM | POA: Diagnosis not present

## 2023-12-02 DIAGNOSIS — Z23 Encounter for immunization: Secondary | ICD-10-CM | POA: Diagnosis not present

## 2023-12-02 DIAGNOSIS — R1011 Right upper quadrant pain: Secondary | ICD-10-CM | POA: Diagnosis not present

## 2023-12-02 DIAGNOSIS — I10 Essential (primary) hypertension: Secondary | ICD-10-CM | POA: Diagnosis not present

## 2023-12-02 DIAGNOSIS — K5900 Constipation, unspecified: Secondary | ICD-10-CM | POA: Diagnosis not present

## 2023-12-15 ENCOUNTER — Ambulatory Visit (INDEPENDENT_AMBULATORY_CARE_PROVIDER_SITE_OTHER): Admitting: Family Medicine

## 2023-12-15 ENCOUNTER — Encounter (INDEPENDENT_AMBULATORY_CARE_PROVIDER_SITE_OTHER): Payer: Self-pay | Admitting: Family Medicine

## 2023-12-15 VITALS — BP 136/84 | HR 91 | Temp 98.5°F | Ht 66.0 in | Wt 232.0 lb

## 2023-12-15 DIAGNOSIS — M1711 Unilateral primary osteoarthritis, right knee: Secondary | ICD-10-CM | POA: Diagnosis not present

## 2023-12-15 DIAGNOSIS — Z6841 Body Mass Index (BMI) 40.0 and over, adult: Secondary | ICD-10-CM

## 2023-12-15 DIAGNOSIS — J309 Allergic rhinitis, unspecified: Secondary | ICD-10-CM

## 2023-12-15 DIAGNOSIS — E669 Obesity, unspecified: Secondary | ICD-10-CM

## 2023-12-15 DIAGNOSIS — Z6837 Body mass index (BMI) 37.0-37.9, adult: Secondary | ICD-10-CM | POA: Diagnosis not present

## 2023-12-15 DIAGNOSIS — E119 Type 2 diabetes mellitus without complications: Secondary | ICD-10-CM | POA: Diagnosis not present

## 2023-12-15 DIAGNOSIS — Z9049 Acquired absence of other specified parts of digestive tract: Secondary | ICD-10-CM

## 2023-12-15 DIAGNOSIS — Z7984 Long term (current) use of oral hypoglycemic drugs: Secondary | ICD-10-CM | POA: Diagnosis not present

## 2023-12-19 NOTE — Progress Notes (Signed)
 Office: [phone]  /  Fax: [phone]  Chief Complaint: OBESITY   History of Present Illness Lindsey [name] is a 68 year old female who presents for a follow-up on her obesity treatment plan and progress assessment.  She is adhering to a structured obesity treatment plan, which includes maintaining a food journal with a daily calorie goal of 1400 to 1800 calories and consuming 100 or more grams of protein per day, achieving these goals about 90% of the time. She engages in physical activity by walking for 30 minutes, four days a week, and has lost 13 pounds since her last visit.  She underwent gallbladder surgery approximately two to three weeks ago following an acute attack that required an ambulance to the emergency room. A large gallstone was identified during an ultrasound, along with gallstones and sludge in the gallbladder. She reports no complications post-surgery and notes a reduction in fluid retention since the procedure.  She experiences knee pain, which limits her walking to about ten blocks in 30 minutes. She is considering knee surgery in early November to improve mobility. She is concerned about potential weight gain during recovery from the surgery due to reduced activity.  She has ongoing issues with allergies, using Flonase  daily and occasionally requiring additional nasal spray to manage symptoms. She describes a recent episode of severe nasal congestion and sneezing, which resolved within a day. A similar episode in March led to an emergency room visit, but no cause was identified.  Her current medication regimen includes metformin , which she takes regularly, and various as-needed medications and supplements.      PHYSICAL EXAM:  Blood pressure 136/84, pulse 91, temperature 98.5 F (36.9 C), height 5' 6 (1.676 m), weight 232 lb (105.2 kg), SpO2 97%. Body mass index is 37.45 kg/m.  DIAGNOSTIC DATA REVIEWED:  BMET    Component Value Date/Time   NA 139  11/23/2023 0506   NA 144 02/22/2022 1212   K 3.9 11/23/2023 0506   CL 104 11/23/2023 0506   CO2 26 11/23/2023 0506   GLUCOSE 94 11/23/2023 0506   BUN 14 11/23/2023 0506   BUN 19 02/22/2022 1212   CREATININE 0.62 11/23/2023 0506   CREATININE 0.63 05/03/2022 1018   CALCIUM 9.0 11/23/2023 0506   GFRNONAA >60 11/23/2023 0506   GFRNONAA >60 05/03/2022 1018   GFRAA 114 03/26/2019 1140   Lab Results  Component Value Date   HGBA1C 5.3 11/23/2023   HGBA1C 5.7 (H) 05/25/2017   Lab Results  Component Value Date   INSULIN  7.3 08/20/2020   INSULIN  44.2 (H) 05/25/2017   Lab Results  Component Value Date   TSH 2.88 09/19/2020   CBC    Component Value Date/Time   WBC 10.2 11/23/2023 0506   RBC 3.88 11/23/2023 0506   HGB 11.7 (L) 11/23/2023 0506   HGB 12.5 05/03/2022 1018   HGB 13.6 05/25/2017 1147   HCT 36.2 11/23/2023 0506   HCT 40.7 05/25/2017 1147   PLT 298 11/23/2023 0506   PLT 277 05/03/2022 1018   MCV 93.3 11/23/2023 0506   MCV 91 05/25/2017 1147   MCH 30.2 11/23/2023 0506   MCHC 32.3 11/23/2023 0506   RDW 12.7 11/23/2023 0506   RDW 13.9 05/25/2017 1147   Iron Studies    Component Value Date/Time   FERRITIN 102.0 09/19/2020 0000   Lipid Panel     Component Value Date/Time   CHOL 178 09/24/2020 0000   CHOL 159 03/26/2019 1140   TRIG 50 09/19/2020  0000   HDL 76 (A) 09/24/2020 0000   HDL 66 03/26/2019 1140   LDLCALC 92 09/24/2020 0000   LDLCALC 81 03/26/2019 1140   Hepatic Function Panel     Component Value Date/Time   PROT 6.3 (L) 11/23/2023 0506   PROT 6.9 08/20/2020 1325   ALBUMIN 3.3 (L) 11/23/2023 0506   ALBUMIN 4.5 08/20/2020 1325   AST 21 11/23/2023 0506   AST 18 05/03/2022 1018   ALT 15 11/23/2023 0506   ALT 15 05/03/2022 1018   ALKPHOS 57 11/23/2023 0506   BILITOT 1.5 (H) 11/23/2023 0506   BILITOT 1.1 05/03/2022 1018      Component Value Date/Time   TSH 2.88 09/19/2020 0000   TSH 2.070 05/25/2017 1147   Nutritional Lab Results   Component Value Date   VD25OH 69.5 08/20/2020   VD25OH 64.7 06/26/2019   VD25OH 56.6 03/26/2019     Assessment and Plan Assessment & Plan Obesity Actively managing weight through dietary changes and exercise. Maintains a food journal with a calorie goal of 1400-1800 calories and 100+ grams of protein daily, achieving this 90% of the time. Exercises by walking 30 minutes, four days a week. Lost 13 pounds since the last visit, with a significant portion attributed to water loss post-cholecystectomy. - Continue current dietary plan with a calorie goal of 1400-1800 calories and 100+ grams of protein per day. - Continue walking 30 minutes, four days a week, as tolerated. - Consider alternative exercises such as chair yoga to reduce joint impact. - Plan for post-surgery period to avoid weight gain by keeping occupied and managing food intake.  Status post cholecystectomy Recovering well from cholecystectomy performed 2-3 weeks ago. Reports no complications and less fluid retention post-surgery. Surgery was performed due to gallstones and sludge, preventing future complications. No infection or abscesses noted in the operative report. - Review operative report to clarify incision sites and any additional findings.  Knee pain Experiences significant knee pain, limiting mobility. Considering scheduling knee surgery for early November to improve mobility and reduce pain. Knee pain affects ability to exercise, and concerned about post-surgery recovery impacting weight management efforts. Adequate nutrition is crucial for recovery, as studies show improved nutrition aids healing. - Contact orthopedist to schedule knee surgery for early November. - Consider physical therapy post-surgery to aid recovery and improve mobility. - Explore alternative low-impact exercises such as chair yoga.  Allergic rhinitis Experiences intermittent symptoms, including nasal congestion and sneezing. Uses Flonase  daily  and occasionally additional nasal sprays for symptom relief. Recent symptoms may be related to increased pollen levels. - Continue daily use of Flonase . - Use additional nasal spray as needed for symptom relief.  Type 2 diabetes mellitus Managing with metformin . No reported issues with the current medication regimen. - Continue metformin  as prescribed. - Continue diet, exercise and weight loss as discussed today as an important part of the treatment plan     Warrene was informed of the importance of frequent follow up visits to maximize her success with intensive lifestyle modifications for her obesity and obesity related health conditions as recommended by USPSTF and CMS guidelines   Louann Penton, MD

## 2023-12-20 DIAGNOSIS — M1712 Unilateral primary osteoarthritis, left knee: Secondary | ICD-10-CM | POA: Diagnosis not present

## 2024-01-05 DIAGNOSIS — H5213 Myopia, bilateral: Secondary | ICD-10-CM | POA: Diagnosis not present

## 2024-01-13 DIAGNOSIS — M1711 Unilateral primary osteoarthritis, right knee: Secondary | ICD-10-CM | POA: Diagnosis not present

## 2024-01-13 DIAGNOSIS — I4510 Unspecified right bundle-branch block: Secondary | ICD-10-CM | POA: Diagnosis not present

## 2024-01-13 DIAGNOSIS — Z01818 Encounter for other preprocedural examination: Secondary | ICD-10-CM | POA: Diagnosis not present

## 2024-01-13 DIAGNOSIS — R7303 Prediabetes: Secondary | ICD-10-CM | POA: Diagnosis not present

## 2024-01-13 DIAGNOSIS — E669 Obesity, unspecified: Secondary | ICD-10-CM | POA: Diagnosis not present

## 2024-01-13 DIAGNOSIS — I10 Essential (primary) hypertension: Secondary | ICD-10-CM | POA: Diagnosis not present

## 2024-01-25 ENCOUNTER — Ambulatory Visit (INDEPENDENT_AMBULATORY_CARE_PROVIDER_SITE_OTHER): Payer: Self-pay | Admitting: Family Medicine

## 2024-01-25 ENCOUNTER — Encounter (INDEPENDENT_AMBULATORY_CARE_PROVIDER_SITE_OTHER): Payer: Self-pay | Admitting: Family Medicine

## 2024-01-25 VITALS — BP 127/80 | HR 90 | Temp 98.6°F | Ht 66.0 in | Wt 227.0 lb

## 2024-01-25 DIAGNOSIS — E669 Obesity, unspecified: Secondary | ICD-10-CM

## 2024-01-25 DIAGNOSIS — Z6836 Body mass index (BMI) 36.0-36.9, adult: Secondary | ICD-10-CM | POA: Diagnosis not present

## 2024-01-25 DIAGNOSIS — R7303 Prediabetes: Secondary | ICD-10-CM

## 2024-01-25 DIAGNOSIS — R609 Edema, unspecified: Secondary | ICD-10-CM

## 2024-01-25 MED ORDER — METFORMIN HCL ER 500 MG PO TB24: 500.0000 mg | ORAL_TABLET | Freq: Two times a day (BID) | ORAL | 0 refills | Status: DC

## 2024-01-25 NOTE — Progress Notes (Signed)
 Office: [phone]  /  Fax: [phone]  WEIGHT SUMMARY AND BIOMETRICS  Anthropometric Measurements Height: 5' 6 (1.676 m) Weight: 227 lb (103 kg) BMI (Calculated): 36.66 Weight at Last Visit: 232 lb Weight Lost Since Last Visit: 5 lb Weight Gained Since Last Visit: 0 Starting Weight: 325 lb Total Weight Loss (lbs): 98 lb (44.5 kg) Peak Weight: 325 lb   Body Composition  Body Fat %: 52 % Fat Mass (lbs): 118.4 lbs Muscle Mass (lbs): 103.6 lbs Visceral Fat Rating : 16   Other Clinical Data Fasting: no Labs: no Today's Visit #: 60 Starting Date: 05/25/17    Chief Complaint: OBESITY    History of Present Illness Lindsey [name] is a 68 year old female with obesity and prediabetes who presents for obesity treatment and progress assessment.  She is actively engaged in a weight management plan, consuming 1500 to 1800 calories daily with over 100 grams of protein, which she achieves 90% of the time. She focuses on tracking calories and macros, increasing fruit and vegetable intake, improving hydration, and avoiding skipped meals. Despite these efforts, she struggles to consistently meet her protein goals. She has lost 5 pounds over the past 6 weeks but is not currently exercising due to knee pain.  She has prediabetes and takes metformin  500 mg daily, and has been working on diet, exercise, and weight loss. Her hemoglobin A1c was 5.7 six years ago and has been 5.5 for quite a while. She experiences leg cramps with excessive sugar intake, such as consuming three doughnuts. She often eats when waking up at night, which she attributes to nighttime hunger.  She is scheduled for a left knee replacement on December 3rd and has seen Dr. Austin for presurgical clearance. There was a misunderstanding regarding her diabetes status, which she clarified as prediabetes. She has experienced fluid retention in the past and is concerned about maintaining her weight and health during  recovery.  She is concerned about her Thanksgiving plans due to her mother's inability to travel, as her mother resides in assisted living. She is exploring options for celebrating with her mother.      PHYSICAL EXAM:  Blood pressure 127/80, pulse 90, temperature 98.6 F (37 C), height 5' 6 (1.676 m), weight 227 lb (103 kg), SpO2 95%. Body mass index is 36.64 kg/m.  DIAGNOSTIC DATA REVIEWED:  BMET    Component Value Date/Time   NA 139 11/23/2023 0506   NA 144 02/22/2022 1212   K 3.9 11/23/2023 0506   CL 104 11/23/2023 0506   CO2 26 11/23/2023 0506   GLUCOSE 94 11/23/2023 0506   BUN 14 11/23/2023 0506   BUN 19 02/22/2022 1212   CREATININE 0.62 11/23/2023 0506   CREATININE 0.63 05/03/2022 1018   CALCIUM 9.0 11/23/2023 0506   GFRNONAA >60 11/23/2023 0506   GFRNONAA >60 05/03/2022 1018   GFRAA 114 03/26/2019 1140   Lab Results  Component Value Date   HGBA1C 5.3 11/23/2023   HGBA1C 5.7 (H) 05/25/2017   Lab Results  Component Value Date   INSULIN  7.3 08/20/2020   INSULIN  44.2 (H) 05/25/2017   Lab Results  Component Value Date   TSH 2.88 09/19/2020   CBC    Component Value Date/Time   WBC 10.2 11/23/2023 0506   RBC 3.88 11/23/2023 0506   HGB 11.7 (L) 11/23/2023 0506   HGB 12.5 05/03/2022 1018   HGB 13.6 05/25/2017 1147   HCT 36.2 11/23/2023 0506   HCT 40.7 05/25/2017 1147  PLT 298 11/23/2023 0506   PLT 277 05/03/2022 1018   MCV 93.3 11/23/2023 0506   MCV 91 05/25/2017 1147   MCH 30.2 11/23/2023 0506   MCHC 32.3 11/23/2023 0506   RDW 12.7 11/23/2023 0506   RDW 13.9 05/25/2017 1147   Iron Studies    Component Value Date/Time   FERRITIN 102.0 09/19/2020 0000   Lipid Panel     Component Value Date/Time   CHOL 178 09/24/2020 0000   CHOL 159 03/26/2019 1140   TRIG 50 09/19/2020 0000   HDL 76 (A) 09/24/2020 0000   HDL 66 03/26/2019 1140   LDLCALC 92 09/24/2020 0000   LDLCALC 81 03/26/2019 1140   Hepatic Function Panel     Component Value  Date/Time   PROT 6.3 (L) 11/23/2023 0506   PROT 6.9 08/20/2020 1325   ALBUMIN 3.3 (L) 11/23/2023 0506   ALBUMIN 4.5 08/20/2020 1325   AST 21 11/23/2023 0506   AST 18 05/03/2022 1018   ALT 15 11/23/2023 0506   ALT 15 05/03/2022 1018   ALKPHOS 57 11/23/2023 0506   BILITOT 1.5 (H) 11/23/2023 0506   BILITOT 1.1 05/03/2022 1018      Component Value Date/Time   TSH 2.88 09/19/2020 0000   TSH 2.070 05/25/2017 1147   Nutritional Lab Results  Component Value Date   VD25OH 69.5 08/20/2020   VD25OH 64.7 06/26/2019   VD25OH 56.6 03/26/2019     Assessment and Plan Assessment & Plan Obesity Management with a journaling plan of 1500-1800 calories and 100+ grams of protein. She reports meeting the caloric goal 90% of the time but struggles with protein intake. Weight loss of 5 pounds in the last 6 weeks. Not currently exercising due to knee pain. Left knee replacement scheduled for December 3rd. - Continue journaling plan with 1500-1800 calories and 100+ grams of protein. - Encouraged increased protein intake. - Encouraged hydration and regular meals. - Encouraged aiming for additional weight loss before knee surgery.  Prediabetes Well-controlled with metformin  500 mg daily. Hemoglobin A1c consistently below 5.5. No signs of diabetes. Reports occasional nighttime eating, possibly related to metformin  timing. - Continue metformin  500 mg daily.  Fluid retention Significant shift in water noted, possibly due to fluid retention or better hydration. Previous high water levels with puffy feet, now resolved. Recent weight loss may include water loss. - Monitor fluid status and continue hydration. - Consider potential causes of fluid retention, such as inflammation or arthritis. - Recheck body composition at next visit in 3-4 weeks    Oktober was counseled on the importance of maintaining healthy lifestyle habits, including balanced nutrition, regular physical activity, and behavioral  modifications, while taking antiobesity medication.  Patient verbalized understanding that medication is an adjunct to, not a replacement for, lifestyle changes and that the long-term success and weight maintenance depend on continued adherence to these strategies.   Jesyka was informed of the importance of frequent follow up visits to maximize her success with intensive lifestyle modifications for her obesity and obesity related health conditions as recommended by USPSTF and CMS guidelines   Louann Penton, MD

## 2024-01-30 ENCOUNTER — Other Ambulatory Visit (INDEPENDENT_AMBULATORY_CARE_PROVIDER_SITE_OTHER): Payer: Self-pay | Admitting: Family Medicine

## 2024-01-31 DIAGNOSIS — M19012 Primary osteoarthritis, left shoulder: Secondary | ICD-10-CM | POA: Diagnosis not present

## 2024-01-31 DIAGNOSIS — M1712 Unilateral primary osteoarthritis, left knee: Secondary | ICD-10-CM | POA: Diagnosis not present

## 2024-02-13 ENCOUNTER — Ambulatory Visit (INDEPENDENT_AMBULATORY_CARE_PROVIDER_SITE_OTHER): Admitting: Family Medicine

## 2024-02-22 DIAGNOSIS — G8918 Other acute postprocedural pain: Secondary | ICD-10-CM | POA: Diagnosis not present

## 2024-02-22 DIAGNOSIS — M794 Hypertrophy of (infrapatellar) fat pad: Secondary | ICD-10-CM | POA: Diagnosis not present

## 2024-02-22 DIAGNOSIS — M1712 Unilateral primary osteoarthritis, left knee: Secondary | ICD-10-CM | POA: Diagnosis not present

## 2024-03-19 ENCOUNTER — Ambulatory Visit (INDEPENDENT_AMBULATORY_CARE_PROVIDER_SITE_OTHER): Admitting: Family Medicine

## 2024-03-19 ENCOUNTER — Encounter (INDEPENDENT_AMBULATORY_CARE_PROVIDER_SITE_OTHER): Payer: Self-pay | Admitting: Family Medicine

## 2024-03-19 VITALS — BP 106/71 | HR 83 | Ht 66.0 in | Wt 222.0 lb

## 2024-03-19 DIAGNOSIS — R7303 Prediabetes: Secondary | ICD-10-CM | POA: Diagnosis not present

## 2024-03-19 DIAGNOSIS — E669 Obesity, unspecified: Secondary | ICD-10-CM

## 2024-03-19 DIAGNOSIS — Z6835 Body mass index (BMI) 35.0-35.9, adult: Secondary | ICD-10-CM

## 2024-03-19 DIAGNOSIS — E559 Vitamin D deficiency, unspecified: Secondary | ICD-10-CM

## 2024-03-19 NOTE — Progress Notes (Signed)
 "  Office: [phone]  /  Fax: [phone]  WEIGHT SUMMARY AND BIOMETRICS  Anthropometric Measurements Height: 5' 6 (1.676 m) Weight: 222 lb (100.7 kg) BMI (Calculated): 35.85 Weight at Last Visit: 227lb Weight Lost Since Last Visit: 5lb Weight Gained Since Last Visit: 0lb Starting Weight: 325lb Total Weight Loss (lbs): 103 lb (46.7 kg) Peak Weight: 325lb   Body Composition  Body Fat %: 53.1 % Fat Mass (lbs): 117.8 lbs Muscle Mass (lbs): 99 lbs Visceral Fat Rating : 16   Other Clinical Data Fasting: No Labs: no Today's Visit #: 33 Starting Date: 05/25/17    Chief Complaint: OBESITY    History of Present Illness Lindsey [name] is a 68 year old female with obesity and prediabetes who presents for obesity treatment and progress assessment.  She is adhering to a dietary plan aiming for 1600 calories and at least 100 grams of protein daily, achieving this approximately 75% of the time. She tracks her nutrition but struggles with consuming sufficient fruits and vegetables. She hydrates well and avoids skipping meals. She engages in walking seven days a week for ten minutes and has lost five pounds over the last seven weeks.  She is on metformin  500 mg twice daily for prediabetes. Her hemoglobin A1c was well controlled at 5.3% in September 2025.  She is taking over-the-counter vitamin D  5000 IU per week for vitamin D  deficiency. Her vitamin D  levels have not been checked in a couple of years.  She underwent knee replacement surgery four weeks ago and experiences significant pain, particularly at night, which affects her sleep. She takes oxycodone  for pain relief, which provides some relief. She is attending physical therapy and has not yet achieved the desired range of motion in her knee. She walks ten minutes five times a day and performs stretching exercises three times a day.  Her social history includes a recent week-long stay in [facility] due to her mother's  hospitalization, during which she ate out twice a day. Post-surgery, she experienced a decreased appetite but now finds herself eating out of boredom, consuming canned soup, sandwiches, and peanut butter with crackers. She reports morning stiffness, attributing it to either weather changes or the early time of day.      PHYSICAL EXAM:  Blood pressure 106/71, pulse 83, height 5' 6 (1.676 m), weight 222 lb (100.7 kg), SpO2 96%. Body mass index is 35.83 kg/m.  DIAGNOSTIC DATA REVIEWED:  BMET    Component Value Date/Time   NA 139 11/23/2023 0506   NA 144 02/22/2022 1212   K 3.9 11/23/2023 0506   CL 104 11/23/2023 0506   CO2 26 11/23/2023 0506   GLUCOSE 94 11/23/2023 0506   BUN 14 11/23/2023 0506   BUN 19 02/22/2022 1212   CREATININE 0.62 11/23/2023 0506   CREATININE 0.63 05/03/2022 1018   CALCIUM 9.0 11/23/2023 0506   GFRNONAA >60 11/23/2023 0506   GFRNONAA >60 05/03/2022 1018   GFRAA 114 03/26/2019 1140   Lab Results  Component Value Date   HGBA1C 5.3 11/23/2023   HGBA1C 5.7 (H) 05/25/2017   Lab Results  Component Value Date   INSULIN  7.3 08/20/2020   INSULIN  44.2 (H) 05/25/2017   Lab Results  Component Value Date   TSH 2.88 09/19/2020   CBC    Component Value Date/Time   WBC 10.2 11/23/2023 0506   RBC 3.88 11/23/2023 0506   HGB 11.7 (L) 11/23/2023 0506   HGB 12.5 05/03/2022 1018   HGB 13.6 05/25/2017 1147  HCT 36.2 11/23/2023 0506   HCT 40.7 05/25/2017 1147   PLT 298 11/23/2023 0506   PLT 277 05/03/2022 1018   MCV 93.3 11/23/2023 0506   MCV 91 05/25/2017 1147   MCH 30.2 11/23/2023 0506   MCHC 32.3 11/23/2023 0506   RDW 12.7 11/23/2023 0506   RDW 13.9 05/25/2017 1147   Iron Studies    Component Value Date/Time   FERRITIN 102.0 09/19/2020 0000   Lipid Panel     Component Value Date/Time   CHOL 178 09/24/2020 0000   CHOL 159 03/26/2019 1140   TRIG 50 09/19/2020 0000   HDL 76 (A) 09/24/2020 0000   HDL 66 03/26/2019 1140   LDLCALC 92 09/24/2020  0000   LDLCALC 81 03/26/2019 1140   Hepatic Function Panel     Component Value Date/Time   PROT 6.3 (L) 11/23/2023 0506   PROT 6.9 08/20/2020 1325   ALBUMIN 3.3 (L) 11/23/2023 0506   ALBUMIN 4.5 08/20/2020 1325   AST 21 11/23/2023 0506   AST 18 05/03/2022 1018   ALT 15 11/23/2023 0506   ALT 15 05/03/2022 1018   ALKPHOS 57 11/23/2023 0506   BILITOT 1.5 (H) 11/23/2023 0506   BILITOT 1.1 05/03/2022 1018      Component Value Date/Time   TSH 2.88 09/19/2020 0000   TSH 2.070 05/25/2017 1147   Nutritional Lab Results  Component Value Date   VD25OH 69.5 08/20/2020   VD25OH 64.7 06/26/2019   VD25OH 56.6 03/26/2019     Assessment and Plan Assessment & Plan Obesity Management is ongoing with a journaling plan of 1600 calories and 100+ grams of protein. She meets these criteria about 75% of the time. She struggles with fruit and vegetable intake but is hydrating and trying not to skip meals. She has lost 5 pounds in the last 7 weeks. Walking 10 minutes a day and performing stretching exercises three times a day. Recent knee replacement surgery has impacted her ability to maintain her usual activity level. - Continue current journaling plan with 1600 calories and 100+ grams of protein. - Encouraged increased intake of fruits and vegetables. - Continue walking and stretching exercises as tolerated.  Prediabetes Well-controlled with a hemoglobin A1c of 5.3% as of September. She is on metformin  500 mg twice a day. - Continue metformin  500 mg twice a day. - Ordered B12 level to monitor for potential metformin -induced deficiency.  Vitamin D  deficiency Managed with over-the-counter vitamin D  5000 IU per week. Vitamin D  levels have not been checked in the system for a couple of years. Given her recent knee replacement and bone healing, it is important to assess current vitamin D  levels. - Ordered vitamin D  level. - Ordered calcium level.      Patients who are on anti-obesity  medications are counseled on the importance of maintaining healthy lifestyle habits, including balanced nutrition, regular physical activity, and behavioral modifications,  Medication is an adjunct to, not a replacement for, lifestyle changes and that the long-term success and weight maintenance depend on continued adherence to these strategies.   Patria was informed of the importance of frequent follow up visits to maximize her success with intensive lifestyle modifications for her obesity and obesity related health conditions as recommended by USPSTF and CMS guidelines   Louann Penton, MD   "

## 2024-03-20 LAB — LIPID PANEL WITH LDL/HDL RATIO
Cholesterol, Total: 144 mg/dL (ref 100–199)
HDL: 56 mg/dL
LDL Chol Calc (NIH): 74 mg/dL (ref 0–99)
LDL/HDL Ratio: 1.3 ratio (ref 0.0–3.2)
Triglycerides: 72 mg/dL (ref 0–149)
VLDL Cholesterol Cal: 14 mg/dL (ref 5–40)

## 2024-03-20 LAB — CMP14+EGFR
ALT: 12 IU/L (ref 0–32)
AST: 17 IU/L (ref 0–40)
Albumin: 4.2 g/dL (ref 3.9–4.9)
Alkaline Phosphatase: 102 IU/L (ref 49–135)
BUN/Creatinine Ratio: 24 (ref 12–28)
BUN: 15 mg/dL (ref 8–27)
Bilirubin Total: 0.6 mg/dL (ref 0.0–1.2)
CO2: 25 mmol/L (ref 20–29)
Calcium: 9.8 mg/dL (ref 8.7–10.3)
Chloride: 101 mmol/L (ref 96–106)
Creatinine, Ser: 0.62 mg/dL (ref 0.57–1.00)
Globulin, Total: 2.1 g/dL (ref 1.5–4.5)
Glucose: 89 mg/dL (ref 70–99)
Potassium: 4.6 mmol/L (ref 3.5–5.2)
Sodium: 140 mmol/L (ref 134–144)
Total Protein: 6.3 g/dL (ref 6.0–8.5)
eGFR: 98 mL/min/1.73

## 2024-03-20 LAB — VITAMIN B12: Vitamin B-12: 427 pg/mL (ref 232–1245)

## 2024-03-20 LAB — VITAMIN D 25 HYDROXY (VIT D DEFICIENCY, FRACTURES): Vit D, 25-Hydroxy: 82 ng/mL (ref 30.0–100.0)

## 2024-03-20 LAB — INSULIN, RANDOM: INSULIN: 12.8 u[IU]/mL (ref 2.6–24.9)

## 2024-03-20 LAB — HEMOGLOBIN A1C
Est. average glucose Bld gHb Est-mCnc: 100 mg/dL
Hgb A1c MFr Bld: 5.1 % (ref 4.8–5.6)

## 2024-03-27 ENCOUNTER — Ambulatory Visit (INDEPENDENT_AMBULATORY_CARE_PROVIDER_SITE_OTHER): Admitting: Family Medicine

## 2024-04-11 ENCOUNTER — Ambulatory Visit (INDEPENDENT_AMBULATORY_CARE_PROVIDER_SITE_OTHER): Admitting: Family Medicine

## 2024-04-12 ENCOUNTER — Ambulatory Visit (INDEPENDENT_AMBULATORY_CARE_PROVIDER_SITE_OTHER): Admitting: Family Medicine

## 2024-04-12 ENCOUNTER — Encounter (INDEPENDENT_AMBULATORY_CARE_PROVIDER_SITE_OTHER): Payer: Self-pay | Admitting: Family Medicine

## 2024-04-12 VITALS — BP 124/74 | HR 76 | Temp 98.1°F | Ht 66.0 in | Wt 222.0 lb

## 2024-04-12 DIAGNOSIS — R7303 Prediabetes: Secondary | ICD-10-CM | POA: Diagnosis not present

## 2024-04-12 DIAGNOSIS — E559 Vitamin D deficiency, unspecified: Secondary | ICD-10-CM | POA: Diagnosis not present

## 2024-04-12 DIAGNOSIS — Z6835 Body mass index (BMI) 35.0-35.9, adult: Secondary | ICD-10-CM

## 2024-04-12 DIAGNOSIS — E669 Obesity, unspecified: Secondary | ICD-10-CM | POA: Diagnosis not present

## 2024-04-12 MED ORDER — METFORMIN HCL ER 500 MG PO TB24: 500.0000 mg | ORAL_TABLET | Freq: Two times a day (BID) | ORAL | 0 refills | Status: AC

## 2024-04-12 NOTE — Progress Notes (Signed)
 "  Office: [phone]  /  Fax: [phone]  WEIGHT SUMMARY AND BIOMETRICS  Anthropometric Measurements Height: 5' 6 (1.676 m) Weight: 222 lb (100.7 kg) BMI (Calculated): 35.85 Weight at Last Visit: 222 lb Weight Lost Since Last Visit: 0 Weight Gained Since Last Visit: 0 Starting Weight: 325 lb Total Weight Loss (lbs): 103 lb (46.7 kg) Peak Weight: 325 lb   Body Composition  Body Fat %: 51.8 % Fat Mass (lbs): 115.4 lbs Muscle Mass (lbs): 101.8 lbs Visceral Fat Rating : 16   Other Clinical Data Fasting: no Labs: no Today's Visit #: 62 Starting Date: 05/25/17    Chief Complaint: OBESITY    History of Present Illness Lindsey [name] is a 69 year old female with obesity and prediabetes who presents for obesity treatment.  She is following a [place] diet plan of 1500-1600 calories with over 100 grams of protein, but adherence is only about 15% of the time. She struggles with consuming fruits and vegetables and is not currently exercising. Despite these challenges, she has maintained her weight over the last month, including through the holidays, and has lost a total of 103 pounds.  She has prediabetes and is taking metformin  XR 500 mg daily. Her most recent hemoglobin A1c was well controlled at 5.1, and her fasting glucose was 89.  She has a vitamin D  deficiency and is taking over-the-counter vitamin D  at 5000 IU per day. Her most recent vitamin D  level was 82, measured approximately one month ago.  She finds it tiring to stand and cook due to her leg issues. She has developed a habit of eating crackers at night, which she attributes to boredom rather than hunger.  She experiences muscle cramps, which she associates with over-exercising or consuming a lot of sugar. She has not been experiencing cramps since her surgery and has stopped taking magnesium pills that were not helping.  She mentions shoulder pain that is becoming problematic, but she has not yet scheduled any  intervention for it.      PHYSICAL EXAM:  Blood pressure 124/74, pulse 76, temperature 98.1 F (36.7 C), height 5' 6 (1.676 m), weight 222 lb (100.7 kg), SpO2 97%. Body mass index is 35.83 kg/m.  DIAGNOSTIC DATA REVIEWED BY MYSELF TODAY:  BMET    Component Value Date/Time   NA 140 03/19/2024 0823   K 4.6 03/19/2024 0823   CL 101 03/19/2024 0823   CO2 25 03/19/2024 0823   GLUCOSE 89 03/19/2024 0823   GLUCOSE 94 11/23/2023 0506   BUN 15 03/19/2024 0823   CREATININE 0.62 03/19/2024 0823   CREATININE 0.63 05/03/2022 1018   CALCIUM 9.8 03/19/2024 0823   GFRNONAA >60 11/23/2023 0506   GFRNONAA >60 05/03/2022 1018   GFRAA 114 03/26/2019 1140   Lab Results  Component Value Date   HGBA1C 5.1 03/19/2024   HGBA1C 5.7 (H) 05/25/2017   Lab Results  Component Value Date   INSULIN  12.8 03/19/2024   INSULIN  44.2 (H) 05/25/2017   Lab Results  Component Value Date   TSH 2.88 09/19/2020   CBC    Component Value Date/Time   WBC 10.2 11/23/2023 0506   RBC 3.88 11/23/2023 0506   HGB 11.7 (L) 11/23/2023 0506   HGB 12.5 05/03/2022 1018   HGB 13.6 05/25/2017 1147   HCT 36.2 11/23/2023 0506   HCT 40.7 05/25/2017 1147   PLT 298 11/23/2023 0506   PLT 277 05/03/2022 1018   MCV 93.3 11/23/2023 0506   MCV 91 05/25/2017 1147  MCH 30.2 11/23/2023 0506   MCHC 32.3 11/23/2023 0506   RDW 12.7 11/23/2023 0506   RDW 13.9 05/25/2017 1147   Iron Studies    Component Value Date/Time   FERRITIN 102.0 09/19/2020 0000   Lipid Panel     Component Value Date/Time   CHOL 144 03/19/2024 0823   TRIG 72 03/19/2024 0823   HDL 56 03/19/2024 0823   LDLCALC 74 03/19/2024 0823   Hepatic Function Panel     Component Value Date/Time   PROT 6.3 03/19/2024 0823   ALBUMIN 4.2 03/19/2024 0823   AST 17 03/19/2024 0823   AST 18 05/03/2022 1018   ALT 12 03/19/2024 0823   ALT 15 05/03/2022 1018   ALKPHOS 102 03/19/2024 0823   BILITOT 0.6 03/19/2024 0823   BILITOT 1.1 05/03/2022 1018       Component Value Date/Time   TSH 2.88 09/19/2020 0000   TSH 2.070 05/25/2017 1147   Nutritional Lab Results  Component Value Date   VD25OH 82.0 03/19/2024   VD25OH 69.5 08/20/2020   VD25OH 64.7 06/26/2019     Assessment and Plan Assessment & Plan Obesity She has lost 103 pounds but struggles with adherence to a 1500-1600 calorie diet with adequate protein intake. She is not currently exercising and has maintained her weight over the last month, including during the holidays. She is not doing well with fruits and vegetables but is trying to hydrate. - Continue current dietary plan with focus on increasing fruit and vegetable intake. - Encouraged meal prepping to simplify meal preparation and improve adherence to dietary plan. - Discussed potential for increasing metformin  to twice daily if hunger becomes a problem. - Easy to prep, nutritionally appropriate recipes discussed and given today.  Prediabetes Well-controlled with a hemoglobin A1c of 5.1. She is on metformin  XR 500 mg daily and requests a refill. Fasting glucose is 89, and insulin  levels are close to optimal at 12. - Refilled metformin  XR 500 mg daily for 90 days. - Continue monitoring blood glucose levels and A1c.  Vitamin D  deficiency Vitamin D  level is 82, which is close to being too high. She is on over-the-counter vitamin D  5000 IU daily. As weight decreases, vitamin D  levels may increase more rapidly. - Reduce vitamin D  intake to every other day if current supply is sufficient. - When 5k vit D depleated reduce vitamin D  intake to 2000 IU daily.      Patients who are on anti-obesity medications are counseled on the importance of maintaining healthy lifestyle habits, including balanced nutrition, regular physical activity, and behavioral modifications,  Medication is an adjunct to, not a replacement for, lifestyle changes and that the long-term success and weight maintenance depend on continued adherence to these  strategies.   Lindsey [name] of the importance of frequent follow up visits to maximize her success with intensive lifestyle modifications for her obesity and obesity related health conditions as recommended by USPSTF and CMS guidelines  Louann Penton, MD   "

## 2024-04-26 ENCOUNTER — Other Ambulatory Visit (INDEPENDENT_AMBULATORY_CARE_PROVIDER_SITE_OTHER): Payer: Self-pay | Admitting: Family Medicine

## 2024-05-10 ENCOUNTER — Ambulatory Visit (INDEPENDENT_AMBULATORY_CARE_PROVIDER_SITE_OTHER): Admitting: Family Medicine

## 2024-06-07 ENCOUNTER — Ambulatory Visit (INDEPENDENT_AMBULATORY_CARE_PROVIDER_SITE_OTHER): Admitting: Family Medicine

## 2024-11-06 ENCOUNTER — Ambulatory Visit: Admitting: Hematology and Oncology

## 2024-11-06 ENCOUNTER — Other Ambulatory Visit
